# Patient Record
Sex: Female | Born: 1990
Health system: Southern US, Community
[De-identification: ages and names within clinical notes are randomized; demographics above are authoritative.]

## PROBLEM LIST (undated history)

## (undated) DIAGNOSIS — G4730 Sleep apnea, unspecified: Secondary | ICD-10-CM

## (undated) DIAGNOSIS — E119 Type 2 diabetes mellitus without complications: Secondary | ICD-10-CM

## (undated) HISTORY — PX: FRACTURE SURGERY: SHX138

---

## 2011-07-18 ENCOUNTER — Encounter (HOSPITAL_COMMUNITY): Payer: Self-pay | Admitting: Emergency Medicine

## 2011-07-18 ENCOUNTER — Emergency Department (HOSPITAL_COMMUNITY)
Admission: EM | Admit: 2011-07-18 | Discharge: 2011-07-19 | Disposition: A | Discharge status: Home / Self Care | Payer: PRIVATE HEALTH INSURANCE | Attending: Emergency Medicine | Admitting: Emergency Medicine

## 2011-07-18 DIAGNOSIS — L2989 Other pruritus: Secondary | ICD-10-CM | POA: Insufficient documentation

## 2011-07-18 DIAGNOSIS — L298 Other pruritus: Secondary | ICD-10-CM | POA: Insufficient documentation

## 2011-07-18 DIAGNOSIS — R21 Rash and other nonspecific skin eruption: Secondary | ICD-10-CM | POA: Insufficient documentation

## 2011-07-18 DIAGNOSIS — J45909 Unspecified asthma, uncomplicated: Secondary | ICD-10-CM | POA: Insufficient documentation

## 2011-07-18 NOTE — ED Notes (Signed)
Pt c/o rash on bil arms and abd. With itching.  Onset approx 1 week ago.  No known exposure to chicken pox

## 2011-07-19 MED ORDER — FAMOTIDINE 20 MG PO TABS
20.0000 mg | ORAL_TABLET | Freq: Once | ORAL | Status: AC
  Administered 2011-07-19: 20 mg via ORAL
  Filled 2011-07-19: qty 1

## 2011-07-19 MED ORDER — FAMOTIDINE 20 MG PO TABS: 20.0000 mg | ORAL_TABLET | Freq: Two times a day (BID) | ORAL | Status: DC

## 2011-07-19 NOTE — ED Provider Notes (Signed)
Medical screening examination/treatment/procedure(s) were performed by non-physician practitioner and as supervising physician I was immediately available for consultation/collaboration.  Aliyyah Riese K Eulia Hatcher-Rasch, MD 07/19/11 0614 

## 2011-07-19 NOTE — ED Provider Notes (Signed)
History     CSN: [id number]  Arrival date & time 07/18/11  2301   First MD Initiated Contact with Patient 07/19/11 [phone]      Chief Complaint  Patient presents with  . Rash    (Consider location/radiation/quality/duration/timing/severity/associated sxs/prior treatment) HPI Comments: Patient has had URI for the last 2 weeks, low-grade fever intermittently.  Has noticed a rash on her upper arms and abdomen for a week.  Tonight it got itchy, and she's been scratching at.  She is allergic to Benadryl.  Has taken nothing for the itch, which is her most concerning symptom  The history is provided by the patient.    Past Medical History  Diagnosis Date  . Asthma     Past Surgical History  Procedure Date  . Fracture surgery     No family history on file.  History  Substance Use Topics  . Smoking status: Current Everyday Smoker  . Smokeless tobacco: Not on file  . Alcohol Use: No    OB History    Grav Para Term Preterm Abortions TAB SAB Ect Mult Living                  Review of Systems  Constitutional: Negative for fever.  Respiratory: Negative for cough and chest tightness.   Musculoskeletal: Negative for myalgias.  Skin: Positive for rash. Negative for wound.    Allergies  Benadryl  Home Medications   Current Outpatient Rx  Name Route Sig Dispense Refill  . ALBUTEROL SULFATE HFA 108 (90 BASE) MCG/ACT IN AERS Inhalation Inhale 2 puffs into the lungs daily as needed. For shortness of breath    . FAMOTIDINE 20 MG PO TABS Oral Take 1 tablet (20 mg total) by mouth 2 (two) times daily. 30 tablet 0    BP 145/106  Pulse 112  Temp(Src) 98.8 F (37.1 C) (Oral)  Resp 19  SpO2 99%  LMP 06/27/2011  Physical Exam  Constitutional: She is oriented to person, place, and time. She appears well-developed and well-nourished.       Morbidly obese  Eyes: Pupils are equal, round, and reactive to light.  Neck: Normal range of motion.  Cardiovascular: Normal rate.     Pulmonary/Chest: Effort normal.  Abdominal: Soft.  Musculoskeletal: Normal range of motion.  Neurological: She is alert and oriented to person, place, and time.  Skin: Rash noted.  Psychiatric: She has a normal mood and affect.    ED Course  Procedures (including critical care time)  Labs Reviewed - No data to display No results found.   1. Rash and nonspecific skin eruption       MDM  Nonspecific rash on abdomen and upper arms, not indicative of scabies or chickenpox not herpetic in nature        Arman Filter, NP 07/19/11 0149  Arman Filter, NP 07/19/11 0152  Arman Filter, NP 07/19/11 [phone]

## 2013-09-12 ENCOUNTER — Encounter (HOSPITAL_COMMUNITY): Payer: Self-pay | Admitting: Emergency Medicine

## 2013-09-12 DIAGNOSIS — J029 Acute pharyngitis, unspecified: Secondary | ICD-10-CM | POA: Insufficient documentation

## 2013-09-12 DIAGNOSIS — Z791 Long term (current) use of non-steroidal anti-inflammatories (NSAID): Secondary | ICD-10-CM | POA: Insufficient documentation

## 2013-09-12 DIAGNOSIS — R112 Nausea with vomiting, unspecified: Secondary | ICD-10-CM | POA: Insufficient documentation

## 2013-09-12 DIAGNOSIS — R630 Anorexia: Secondary | ICD-10-CM | POA: Insufficient documentation

## 2013-09-12 DIAGNOSIS — Z792 Long term (current) use of antibiotics: Secondary | ICD-10-CM | POA: Insufficient documentation

## 2013-09-12 DIAGNOSIS — Z79899 Other long term (current) drug therapy: Secondary | ICD-10-CM | POA: Insufficient documentation

## 2013-09-12 DIAGNOSIS — J45909 Unspecified asthma, uncomplicated: Secondary | ICD-10-CM | POA: Insufficient documentation

## 2013-09-12 DIAGNOSIS — F17200 Nicotine dependence, unspecified, uncomplicated: Secondary | ICD-10-CM | POA: Insufficient documentation

## 2013-09-12 NOTE — ED Notes (Signed)
Pt. reports worsening sore throat - unable to eat , dizziness with nausea currently taking Amoxicillin antibiotic  for sore throat . Respirations unlabored / airway intact . denies fever or chills.

## 2013-09-13 ENCOUNTER — Emergency Department (HOSPITAL_COMMUNITY)
Admission: EM | Admit: 2013-09-13 | Discharge: 2013-09-13 | Disposition: A | Discharge status: Home / Self Care | Payer: BC Managed Care – PPO | Attending: Emergency Medicine | Admitting: Emergency Medicine

## 2013-09-13 DIAGNOSIS — J029 Acute pharyngitis, unspecified: Secondary | ICD-10-CM

## 2013-09-13 LAB — BASIC METABOLIC PANEL
BUN: 8 mg/dL (ref 6–23)
CALCIUM: 9.3 mg/dL (ref 8.4–10.5)
CO2: 25 meq/L (ref 19–32)
Chloride: 101 mEq/L (ref 96–112)
Creatinine, Ser: 0.64 mg/dL (ref 0.50–1.10)
GFR calc Af Amer: >90 mL/min (ref >90)
Glucose, Bld: 106 mg/dL — ABNORMAL HIGH (ref 70–99)
POTASSIUM: 4.1 meq/L (ref 3.7–5.3)
SODIUM: 140 meq/L (ref 137–147)

## 2013-09-13 LAB — URINALYSIS, ROUTINE W REFLEX MICROSCOPIC
Bilirubin Urine: NEGATIVE
GLUCOSE, UA: NEGATIVE mg/dL
Ketones, ur: 15 mg/dL — AB
LEUKOCYTES UA: NEGATIVE
Nitrite: NEGATIVE
PROTEIN: 30 mg/dL — AB
SPECIFIC GRAVITY, URINE: 1.033 — AB (ref 1.005–1.030)
UROBILINOGEN UA: 0.2 mg/dL (ref 0.0–1.0)
pH: 6.5 (ref 5.0–8.0)

## 2013-09-13 LAB — CBC WITH DIFFERENTIAL/PLATELET
BASOS ABS: 0 10*3/uL (ref 0.0–0.1)
Basophils Relative: 0 % (ref 0–1)
EOS PCT: 1 % (ref 0–5)
Eosinophils Absolute: 0.1 10*3/uL (ref 0.0–0.7)
HCT: 34.2 % — ABNORMAL LOW (ref 36.0–46.0)
Hemoglobin: 10.6 g/dL — ABNORMAL LOW (ref 12.0–15.0)
LYMPHS ABS: 3.6 10*3/uL (ref 0.7–4.0)
Lymphocytes Relative: 40 % (ref 12–46)
MCH: 20.5 pg — ABNORMAL LOW (ref 26.0–34.0)
MCHC: 31 g/dL (ref 30.0–36.0)
MCV: 66.2 fL — AB (ref 78.0–100.0)
MONOS PCT: 5 % (ref 3–12)
Monocytes Absolute: 0.5 10*3/uL (ref 0.1–1.0)
NEUTROS PCT: 54 % (ref 43–77)
Neutro Abs: 4.9 10*3/uL (ref 1.7–7.7)
PLATELETS: 463 10*3/uL — AB (ref 150–400)
RBC: 5.17 MIL/uL — AB (ref 3.87–5.11)
RDW: 17.9 % — AB (ref 11.5–15.5)
WBC: 9.1 10*3/uL (ref 4.0–10.5)

## 2013-09-13 LAB — URINE MICROSCOPIC-ADD ON

## 2013-09-13 MED ORDER — DEXAMETHASONE SODIUM PHOSPHATE 10 MG/ML IJ SOLN
10.0000 mg | Freq: Once | INTRAMUSCULAR | Status: AC
  Administered 2013-09-13: 10 mg via INTRAMUSCULAR
  Filled 2013-09-13: qty 1

## 2013-09-13 MED ORDER — SODIUM CHLORIDE 0.9 % IV BOLUS (SEPSIS)
500.0000 mL | Freq: Once | INTRAVENOUS | Status: AC
  Administered 2013-09-13: 500 mL via INTRAVENOUS

## 2013-09-13 MED ORDER — LIDOCAINE VISCOUS 2 % MT SOLN: 20.0000 mL | OROMUCOSAL | Status: DC | PRN

## 2013-09-13 MED ORDER — ONDANSETRON 4 MG PO TBDP: 4.0000 mg | ORAL_TABLET | Freq: Three times a day (TID) | ORAL | Status: DC | PRN

## 2013-09-13 NOTE — Discharge Instructions (Signed)
Pharyngitis °Pharyngitis is redness, pain, and swelling (inflammation) of your pharynx.  °CAUSES  °Pharyngitis is usually caused by infection. Most of the time, these infections are from viruses (viral) and are part of a cold. However, sometimes pharyngitis is caused by bacteria (bacterial). Pharyngitis can also be caused by allergies. Viral pharyngitis may be spread from person to person by coughing, sneezing, and personal items or utensils (cups, forks, spoons, toothbrushes). Bacterial pharyngitis may be spread from person to person by more intimate contact, such as kissing.  °SIGNS AND SYMPTOMS  °Symptoms of pharyngitis include:   °· Sore throat.   °· Tiredness (fatigue).   °· Low-grade fever.   °· Headache. °· Joint pain and muscle aches. °· Skin rashes. °· Swollen lymph nodes. °· Plaque-like film on throat or tonsils (often seen with bacterial pharyngitis). °DIAGNOSIS  °Your health care provider will ask you questions about your illness and your symptoms. Your medical history, along with a physical exam, is often all that is needed to diagnose pharyngitis. Sometimes, a rapid strep test is done. Other lab tests may also be done, depending on the suspected cause.  °TREATMENT  °Viral pharyngitis will usually get better in 3 4 days without the use of medicine. Bacterial pharyngitis is treated with medicines that kill germs (antibiotics).  °HOME CARE INSTRUCTIONS  °· Drink enough water and fluids to keep your urine clear or pale yellow.   °· Only take over-the-counter or prescription medicines as directed by your health care provider:   °· If you are prescribed antibiotics, make sure you finish them even if you start to feel better.   °· Do not take aspirin.   °· Get lots of rest.   °· Gargle with 8 oz of salt water (½ tsp of salt per 1 qt of water) as often as every 1 2 hours to soothe your throat.   °· Throat lozenges (if you are not at risk for choking) or sprays may be used to soothe your throat. °SEEK MEDICAL  CARE IF:  °· You have large, tender lumps in your neck. °· You have a rash. °· You cough up green, yellow-brown, or bloody spit. °SEEK IMMEDIATE MEDICAL CARE IF:  °· Your neck becomes stiff. °· You drool or are unable to swallow liquids. °· You vomit or are unable to keep medicines or liquids down. °· You have severe pain that does not go away with the use of recommended medicines. °· You have trouble breathing (not caused by a stuffy nose). °MAKE SURE YOU:  °· Understand these instructions. °· Will watch your condition. °· Will get help right away if you are not doing well or get worse. °Document Released: 05/26/2005 Document Revised: 03/16/2013 Document Reviewed: 01/31/2013 °[employer]® Patient Information ©2014 [employer]. ° °

## 2013-09-13 NOTE — ED Provider Notes (Signed)
CSN: [id number]     Arrival date & time 09/12/13  2143 History   First MD Initiated Contact with Patient 09/13/13 0121     Chief Complaint  Patient presents with  . Sore Throat     (Consider location/radiation/quality/duration/timing/severity/associated sxs/prior Treatment) Patient is a 23 y.o. female presenting with pharyngitis. The history is provided by the patient. No language interpreter was used.  Sore Throat The current episode started in the past 7 days. The problem has been gradually worsening. Associated symptoms include anorexia, chills, congestion, headaches, nausea, a sore throat, vomiting and weakness. The symptoms are aggravated by swallowing.    Past Medical History  Diagnosis Date  . Asthma    Past Surgical History  Procedure Laterality Date  . Fracture surgery     No family history on file. History  Substance Use Topics  . Smoking status: Current Every Day Smoker  . Smokeless tobacco: Not on file  . Alcohol Use: No   OB History   Grav Para Term Preterm Abortions TAB SAB Ect Mult Living                 Review of Systems  Constitutional: Positive for chills.  HENT: Positive for congestion and sore throat.   Gastrointestinal: Positive for nausea, vomiting and anorexia.  Neurological: Positive for weakness and headaches.  All other systems reviewed and are negative.      Allergies  Benadryl  Home Medications   Current Outpatient Rx  Name  Route  Sig  Dispense  Refill  . albuterol (PROVENTIL HFA;VENTOLIN HFA) 108 (90 BASE) MCG/ACT inhaler   Inhalation   Inhale 2 puffs into the lungs daily as needed. For shortness of breath         . amoxicillin (AMOXIL) 500 MG capsule   Oral   Take 500 mg by mouth 3 (three) times daily. For 10 days. Started on 09-04-13         . HYDROcodone-acetaminophen (NORCO/VICODIN) 5-325 MG per tablet   Oral   Take 1 tablet by mouth every 6 (six) hours as needed for moderate pain.         . naproxen sodium  (ANAPROX) 220 MG tablet   Oral   Take 220 mg by mouth 2 (two) times daily with a meal.         . Phenylephrine-DM-GG-APAP (COLD & FLU SEVERE) 5-10-200-325 MG TABS   Oral   Take 1 tablet by mouth every 6 (six) hours as needed (c).         . EXPIRED: famotidine (PEPCID) 20 MG tablet   Oral   Take 1 tablet (20 mg total) by mouth 2 (two) times daily.   30 tablet   0    BP 147/94  Pulse 77  Temp(Src) 98.3 F (36.8 C) (Oral)  Resp 14  Ht 5\' 5"  (1.651 m)  Wt 314 lb (142.429 kg)  BMI 52.25 kg/m2  SpO2 99%  LMP 09/09/2013 Physical Exam  Nursing note and vitals reviewed. Constitutional: She is oriented to person, place, and time. She appears well-developed and well-nourished.  HENT:  Head: Normocephalic.  Mouth/Throat: No oropharyngeal exudate.  Eyes: Pupils are equal, round, and reactive to light.  Neck: Normal range of motion.  Cardiovascular: Normal rate and regular rhythm.   Pulmonary/Chest: Effort normal and breath sounds normal.  Abdominal: Soft.  Musculoskeletal: She exhibits no edema and no tenderness.  Lymphadenopathy:    She has no cervical adenopathy.  Neurological: She is alert and oriented to  person, place, and time.  Skin: Skin is warm and dry.  Psychiatric: She has a normal mood and affect.    ED Course  Procedures (including critical care time) Labs Review Labs Reviewed - No data to display Imaging Review No results found.   EKG Interpretation None     Labs reviewed, results shared with patient.  Feels better after IV fluids.  Tolerating PO fluid. MDM   Final diagnoses:  None    Pharyngitis.    Jimmye Normanavid John Worthy Boschert, NP 09/13/13 [phone]

## 2013-09-13 NOTE — ED Provider Notes (Signed)
Medical screening examination/treatment/procedure(s) were performed by non-physician practitioner and as supervising physician I was immediately available for consultation/collaboration.   EKG Interpretation None        Brandt LoosenJulie Manly, MD 09/13/13 [phone]

## 2013-10-01 ENCOUNTER — Emergency Department (HOSPITAL_COMMUNITY): Payer: BC Managed Care – PPO

## 2013-10-01 ENCOUNTER — Emergency Department (HOSPITAL_COMMUNITY)
Admission: EM | Admit: 2013-10-01 | Discharge: 2013-10-01 | Disposition: A | Discharge status: Home / Self Care | Payer: BC Managed Care – PPO | Attending: Emergency Medicine | Admitting: Emergency Medicine

## 2013-10-01 ENCOUNTER — Encounter (HOSPITAL_COMMUNITY): Payer: Self-pay | Admitting: Emergency Medicine

## 2013-10-01 DIAGNOSIS — F17200 Nicotine dependence, unspecified, uncomplicated: Secondary | ICD-10-CM | POA: Insufficient documentation

## 2013-10-01 DIAGNOSIS — J45909 Unspecified asthma, uncomplicated: Secondary | ICD-10-CM | POA: Insufficient documentation

## 2013-10-01 DIAGNOSIS — X500XXA Overexertion from strenuous movement or load, initial encounter: Secondary | ICD-10-CM | POA: Insufficient documentation

## 2013-10-01 DIAGNOSIS — Z79899 Other long term (current) drug therapy: Secondary | ICD-10-CM | POA: Insufficient documentation

## 2013-10-01 DIAGNOSIS — Z791 Long term (current) use of non-steroidal anti-inflammatories (NSAID): Secondary | ICD-10-CM | POA: Insufficient documentation

## 2013-10-01 DIAGNOSIS — S93409A Sprain of unspecified ligament of unspecified ankle, initial encounter: Secondary | ICD-10-CM | POA: Insufficient documentation

## 2013-10-01 DIAGNOSIS — S93401A Sprain of unspecified ligament of right ankle, initial encounter: Secondary | ICD-10-CM

## 2013-10-01 DIAGNOSIS — Y9301 Activity, walking, marching and hiking: Secondary | ICD-10-CM | POA: Insufficient documentation

## 2013-10-01 DIAGNOSIS — Y929 Unspecified place or not applicable: Secondary | ICD-10-CM | POA: Insufficient documentation

## 2013-10-01 MED ORDER — IBUPROFEN 800 MG PO TABS: 800.0000 mg | ORAL_TABLET | Freq: Three times a day (TID) | ORAL | Status: DC

## 2013-10-01 MED ORDER — HYDROCODONE-ACETAMINOPHEN 5-325 MG PO TABS: 1.0000 | ORAL_TABLET | Freq: Four times a day (QID) | ORAL | Status: DC | PRN

## 2013-10-01 MED ORDER — METHOCARBAMOL 500 MG PO TABS: 500.0000 mg | ORAL_TABLET | Freq: Two times a day (BID) | ORAL | Status: DC

## 2013-10-01 NOTE — Progress Notes (Signed)
Orthopedic Tech Progress Note Patient Details:  Diana Joyce 11/10/1990 [id number]  Ortho Devices Type of Ortho Device: ASO Ortho Device/Splint Location: rle Ortho Device/Splint Interventions: Application   Deloris Mittag 10/01/2013, 8:16 PM

## 2013-10-01 NOTE — Discharge Instructions (Signed)
Acute Ankle Sprain  with Phase I Rehab  An acute ankle sprain is a partial or complete tear in one or more of the ligaments of the ankle due to traumatic injury. The severity of the injury depends on both the the number of ligaments sprained and the grade of sprain. There are 3 grades of sprains.   · A grade 1 sprain is a mild sprain. There is a slight pull without obvious tearing. There is no loss of strength, and the muscle and ligament are the correct length.  · A grade 2 sprain is a moderate sprain. There is tearing of fibers within the substance of the ligament where it connects two bones or two cartilages. The length of the ligament is increased, and there is usually decreased strength.  · A grade 3 sprain is a complete rupture of the ligament and is uncommon.  In addition to the grade of sprain, there are three types of ankle sprains.   Lateral ankle sprains: This is a sprain of one or more of the three ligaments on the outer side (lateral) of the ankle. These are the most common sprains.  Medial ankle sprains: There is one large triangular ligament of the inner side (medial) of the ankle that is susceptible to injury. Medial ankle sprains are less common.  Syndesmosis, "high ankle," sprains: The syndesmosis is the ligament that connects the two bones of the lower leg. Syndesmosis sprains usually only occur with very severe ankle sprains.  SYMPTOMS  · Pain, tenderness, and swelling in the ankle, starting at the side of injury that may progress to the whole ankle and foot with time.  · "Pop" or tearing sensation at the time of injury.  · Bruising that may spread to the heel.  · Impaired ability to walk soon after injury.  CAUSES   · Acute ankle sprains are caused by trauma placed on the ankle that temporarily forces or pries the anklebone (talus) out of its normal socket.  · Stretching or tearing of the ligaments that normally hold the joint in place (usually due to a twisting injury).  RISK INCREASES  WITH:  · Previous ankle sprain.  · Sports in which the foot may land awkwardly (ie. basketball, volleyball, or soccer) or walking or running on uneven or rough surfaces.  · Shoes with inadequate support to prevent sideways motion when stress occurs.  · Poor strength and flexibility.  · Poor balance skills.  · Contact sports.  PREVENTION   · Warm up and stretch properly before activity.  · Maintain physical fitness:  · Ankle and leg flexibility, muscle strength, and endurance.  · Cardiovascular fitness.  · Balance training activities.  · Use proper technique and have a coach correct improper technique.  · Taping, protective strapping, bracing, or high-top tennis shoes may help prevent injury. Initially, tape is best; however, it loses most of its support function within 10 to 15 minutes.  · Wear proper fitted protective shoes (High-top shoes with taping or bracing is more effective than either alone).  · Provide the ankle with support during sports and practice activities for 12 months following injury.  PROGNOSIS   · If treated properly, ankle sprains can be expected to recover completely; however, the length of recovery depends on the degree of injury.  · A grade 1 sprain usually heals enough in 5 to 7 days to allow modified activity and requires an average of 6 weeks to heal completely.  · A grade 2 sprain requires   6 to 10 weeks to heal completely.  · A grade 3 sprain requires 12 to 16 weeks to heal.  · A syndesmosis sprain often takes more than 3 months to heal.  RELATED COMPLICATIONS   · Frequent recurrence of symptoms may result in a chronic problem. Appropriately addressing the problem the first time decreases the frequency of recurrence and optimizes healing time. Severity of the initial sprain does not predict the likelihood of later instability.  · Injury to other structures (bone, cartilage, or tendon).  · A chronically unstable or arthritic ankle joint is a possiblity with repeated  sprains.  TREATMENT  Treatment initially involves the use of ice, medication, and compression bandages to help reduce pain and inflammation. Ankle sprains are usually immobilized in a walking cast or boot to allow for healing. Crutches may be recommended to reduce pressure on the injury. After immobilization, strengthening and stretching exercises may be necessary to regain strength and a full range of motion. Surgery is rarely needed to treat ankle sprains.  MEDICATION   · Nonsteroidal anti-inflammatory medications, such as aspirin and ibuprofen (do not take for the first 3 days after injury or within 7 days before surgery), or other minor pain relievers, such as acetaminophen, are often recommended. Take these as directed by your caregiver. Contact your caregiver immediately if any bleeding, stomach upset, or signs of an allergic reaction occur from these medications.  · Ointments applied to the skin may be helpful.  · Pain relievers may be prescribed as necessary by your caregiver. Do not take prescription pain medication for longer than 4 to 7 days. Use only as directed and only as much as you need.  HEAT AND COLD  · Cold treatment (icing) is used to relieve pain and reduce inflammation for acute and chronic cases. Cold should be applied for 10 to 15 minutes every 2 to 3 hours for inflammation and pain and immediately after any activity that aggravates your symptoms. Use ice packs or an ice massage.  · Heat treatment may be used before performing stretching and strengthening activities prescribed by your caregiver. Use a heat pack or a warm soak.  SEEK IMMEDIATE MEDICAL CARE IF:   · Pain, swelling, or bruising worsens despite treatment.  · You experience pain, numbness, discoloration, or coldness in the foot or toes.  · New, unexplained symptoms develop (drugs used in treatment may produce side effects.)  EXERCISES   PHASE I EXERCISES  RANGE OF MOTION (ROM) AND STRETCHING EXERCISES - Ankle Sprain, Acute Phase I,  Weeks 1 to 2  These exercises may help you when beginning to restore flexibility in your ankle. You will likely work on these exercises for the 1 to 2 weeks after your injury. Once your physician, physical therapist, or athletic trainer sees adequate progress, he or she will advance your exercises. While completing these exercises, remember:   · Restoring tissue flexibility helps normal motion to return to the joints. This allows healthier, less painful movement and activity.  · An effective stretch should be held for at least 30 seconds.  · A stretch should never be painful. You should only feel a gentle lengthening or release in the stretched tissue.  RANGE OF MOTION - Dorsi/Plantar Flexion  · While sitting with your right / left knee straight, draw the top of your foot upwards by flexing your ankle. Then reverse the motion, pointing your toes downward.  · Hold each position for __________ seconds.  · After completing your first set of   exercises, repeat this exercise with your knee bent.  Repeat __________ times. Complete this exercise __________ times per day.   RANGE OF MOTION - Ankle Alphabet  · Imagine your right / left big toe is a pen.  · Keeping your hip and knee still, write out the entire alphabet with your "pen." Make the letters as large as you can without increasing any discomfort.  Repeat __________ times. Complete this exercise __________ times per day.   STRENGTHENING EXERCISES - Ankle Sprain, Acute -Phase I, Weeks 1 to 2  These exercises may help you when beginning to restore strength in your ankle. You will likely work on these exercises for 1 to 2 weeks after your injury. Once your physician, physical therapist, or athletic trainer sees adequate progress, he or she will advance your exercises. While completing these exercises, remember:   · Muscles can gain both the endurance and the strength needed for everyday activities through controlled exercises.  · Complete these exercises as instructed by  your physician, physical therapist, or athletic trainer. Progress the resistance and repetitions only as guided.  · You may experience muscle soreness or fatigue, but the pain or discomfort you are trying to eliminate should never worsen during these exercises. If this pain does worsen, stop and make certain you are following the directions exactly. If the pain is still present after adjustments, discontinue the exercise until you can discuss the trouble with your clinician.  STRENGTH - Dorsiflexors  · Secure a rubber exercise band/tubing to a fixed object (ie. table, pole) and loop the other end around your right / left foot.  · Sit on the floor facing the fixed object. The band/tubing should be slightly tense when your foot is relaxed.  · Slowly draw your foot back toward you using your ankle and toes.  · Hold this position for __________ seconds. Slowly release the tension in the band and return your foot to the starting position.  Repeat __________ times. Complete this exercise __________ times per day.   STRENGTH - Plantar-flexors   · Sit with your right / left leg extended. Holding onto both ends of a rubber exercise band/tubing, loop it around the ball of your foot. Keep a slight tension in the band.  · Slowly push your toes away from you, pointing them downward.  · Hold this position for __________ seconds. Return slowly, controlling the tension in the band/tubing.  Repeat __________ times. Complete this exercise __________ times per day.   STRENGTH - Ankle Eversion  · Secure one end of a rubber exercise band/tubing to a fixed object (table, pole). Loop the other end around your foot just before your toes.  · Place your fists between your knees. This will focus your strengthening at your ankle.  · Drawing the band/tubing across your opposite foot, slowly, pull your little toe out and up. Make sure the band/tubing is positioned to resist the entire motion.  · Hold this position for __________ seconds.  Have  your muscles resist the band/tubing as it slowly pulls your foot back to the starting position.   Repeat __________ times. Complete this exercise __________ times per day.   STRENGTH - Ankle Inversion  · Secure one end of a rubber exercise band/tubing to a fixed object (table, pole). Loop the other end around your foot just before your toes.  · Place your fists between your knees. This will focus your strengthening at your ankle.  · Slowly, pull your big toe up and in, making   sure the band/tubing is positioned to resist the entire motion.  · Hold this position for __________ seconds.  · Have your muscles resist the band/tubing as it slowly pulls your foot back to the starting position.  Repeat __________ times. Complete this exercises __________ times per day.   STRENGTH - Towel Curls  · Sit in a chair positioned on a non-carpeted surface.  · Place your right / left foot on a towel, keeping your heel on the floor.  · Pull the towel toward your heel by only curling your toes. Keep your heel on the floor.  · If instructed by your physician, physical therapist, or athletic trainer, add weight to the end of the towel.  Repeat __________ times. Complete this exercise __________ times per day.  Document Released: 12/25/2004 Document Revised: 08/18/2011 Document Reviewed: 09/07/2008  [employer]® Patient Information ©2014 [employer].

## 2013-10-01 NOTE — ED Notes (Signed)
Ortho notified

## 2013-10-01 NOTE — ED Provider Notes (Signed)
CSN: [id number]     Arrival date & time 10/01/13  1738 History   First MD Initiated Contact with Patient 10/01/13 1754 This chart was scribed for non-physician practitioner Fayrene HelperBowie Garyn Arlotta, PA-C working with Gavin PoundMichael Y. Oletta LamasGhim, MD by Valera CastleSteven Perry, ED scribe. This patient was seen in room TR11C/TR11C and the patient's care was started at 6:08 PM.     Chief Complaint  Patient presents with  . Ankle Pain   (Consider location/radiation/quality/duration/timing/severity/associated sxs/prior Treatment) The history is provided by the patient. No language interpreter was used.   HPI Comments: Diana Joyce is a 23 y.o. female who presents to the Emergency Department complaining of constant, right ankle pain, with associated swelling, onset yesterday when she rolled her ankle after stepping in a crack while walking. She reports her ankle pain radiates up her right shin and reports sharp 10/10 pain upon palpation of her ankle. She reports being ambulatory after the incident, but has pain when applying pressure. She denies h/o right ankle injury. She reports taking Vicodin for her pain without relief. She denies hitting her head, LOC, wounds, and any other associated symptoms.   PCP - Default, Provider, MD  Past Medical History  Diagnosis Date  . Asthma   . Borderline diabetes    Past Surgical History  Procedure Laterality Date  . Fracture surgery     No family history on file. History  Substance Use Topics  . Smoking status: Current Some Day Smoker  . Smokeless tobacco: Not on file  . Alcohol Use: No   OB History   Grav Para Term Preterm Abortions TAB SAB Ect Mult Living                 Review of Systems  Musculoskeletal: Positive for arthralgias (right ankle) and joint swelling.  Skin: Negative for wound.   Allergies  Benadryl  Home Medications   Prior to Admission medications   Medication Sig Start Date End Date Taking? Authorizing Provider  albuterol (PROVENTIL HFA;VENTOLIN HFA) 108  (90 BASE) MCG/ACT inhaler Inhale 2 puffs into the lungs daily as needed. For shortness of breath    Historical Provider, MD  amoxicillin (AMOXIL) 500 MG capsule Take 500 mg by mouth 3 (three) times daily. For 10 days. Started on 09-04-13    Historical Provider, MD  famotidine (PEPCID) 20 MG tablet Take 1 tablet (20 mg total) by mouth 2 (two) times daily. 07/19/11 07/18/12  Arman FilterGail K Schulz, NP  HYDROcodone-acetaminophen (NORCO/VICODIN) 5-325 MG per tablet Take 1 tablet by mouth every 6 (six) hours as needed for moderate pain.    Historical Provider, MD  lidocaine (XYLOCAINE) 2 % solution Use as directed 20 mLs in the mouth or throat as needed for mouth pain. 09/13/13   Jimmye Normanavid John Smith, NP  naproxen sodium (ANAPROX) 220 MG tablet Take 220 mg by mouth 2 (two) times daily with a meal.    Historical Provider, MD  ondansetron (ZOFRAN-ODT) 4 MG disintegrating tablet Take 1 tablet (4 mg total) by mouth every 8 (eight) hours as needed for nausea. 09/13/13   Jimmye Normanavid John Smith, NP  Phenylephrine-DM-GG-APAP (COLD & FLU SEVERE) 5-10-200-325 MG TABS Take 1 tablet by mouth every 6 (six) hours as needed (c).    Historical Provider, MD   BP 143/68  Pulse 97  Temp(Src) 98.3 F (36.8 C) (Oral)  Ht 5\' 5"  (1.651 m)  Wt 315 lb (142.883 kg)  BMI 52.42 kg/m2  SpO2 100%  LMP 09/09/2013  Physical Exam  Nursing note and  vitals reviewed. Constitutional: She is oriented to person, place, and time. She appears well-developed and well-nourished. No distress.  HENT:  Head: Normocephalic and atraumatic.  Eyes: EOM are normal.  Neck: Neck supple.  Cardiovascular: Normal rate and intact distal pulses.   DP pulses intact.  Pulmonary/Chest: Effort normal. No respiratory distress.  Musculoskeletal: Normal range of motion.  Right ankle tenderness to medial malleolus, lateral malleolus, and posterior mallelous with edema noted to lateral malleolus. Edema noted to proximal dorsum of right foot. Tenderness to 5th MTP of right foot.  Decreased ROM to right ankle in all directions secondary to pain.  Neurological: She is alert and oriented to person, place, and time.  Normal sensation throughout. Brisk cap refill to all toes.  Skin: Skin is warm and dry.  Psychiatric: She has a normal mood and affect. Her behavior is normal.    ED Course  Procedures (including critical care time)  DIAGNOSTIC STUDIES: Oxygen Saturation is 100% on room air, normal by my interpretation.    COORDINATION OF CARE: 6:14 PM-Discussed treatment plan which includes DG right ankle and foot with pt at bedside and pt agreed to plan.   7:47 PM Xray of R ankle/foot without acute fx/dislocation.  RICE therapy discussed.  ASO and crutches provided.  Ortho referral as needed.    Dg Ankle Complete Right  10/01/2013   CLINICAL DATA:  Twisted ankle on uneven pavement, lateral pain extending to top of foot  EXAM: RIGHT ANKLE - COMPLETE 3+ VIEW  COMPARISON:  None.  FINDINGS: Lateral soft tissue swelling.  Osseous mineralization normal.  Joint spaces preserved.  No acute fracture, dislocation or bone destruction.  IMPRESSION: No acute osseous abnormalities.   Electronically Signed   By: Ulyses SouthwardMark  Boles M.D.   On: 10/01/2013 19:10   Dg Foot Complete Right  10/01/2013   CLINICAL DATA:  Twisted ankle on uneven pavement, lateral ankle pain extending to top of foot especially base of third metatarsal  EXAM: RIGHT FOOT COMPLETE - 3+ VIEW  COMPARISON:  None  FINDINGS: Osseous mineralization normal.  Joint spaces preserved.  No fracture, dislocation, or bone destruction.  IMPRESSION: No acute osseous abnormalities.   Electronically Signed   By: Ulyses SouthwardMark  Boles M.D.   On: 10/01/2013 19:11    EKG Interpretation None     Medications - No data to display  MDM   Final diagnoses:  Right ankle sprain    BP 143/68  Pulse 97  Temp(Src) 98.3 F (36.8 C) (Oral)  Ht 5\' 5"  (1.651 m)  Wt 315 lb (142.883 kg)  BMI 52.42 kg/m2  SpO2 100%  LMP 09/09/2013  I have reviewed  nursing notes and vital signs. I personally reviewed the imaging tests through PACS system  I reviewed available ER/hospitalization records thought the EMR   I personally performed the services described in this documentation, which was scribed in my presence. The recorded information has been reviewed and is accurate.     Fayrene HelperBowie Geisha Abernathy, PA-C 10/01/13 1948

## 2013-10-01 NOTE — ED Notes (Signed)
Per EMS patient rolled right ankle last night. Swelling to foot and ankle. Splinted by PTAR with pillow.

## 2013-10-02 NOTE — ED Provider Notes (Signed)
Medical screening examination/treatment/procedure(s) were performed by non-physician practitioner and as supervising physician I was immediately available for consultation/collaboration.  Ayeisha Lindenberger Y. Tenee Wish, MD 10/02/13 0036 

## 2014-02-17 ENCOUNTER — Emergency Department (INDEPENDENT_AMBULATORY_CARE_PROVIDER_SITE_OTHER)
Admission: EM | Admit: 2014-02-17 | Discharge: 2014-02-17 | Disposition: A | Discharge status: Home / Self Care | Payer: BC Managed Care – PPO | Source: Home / Self Care

## 2014-02-17 ENCOUNTER — Encounter (HOSPITAL_COMMUNITY): Payer: Self-pay | Admitting: Emergency Medicine

## 2014-02-17 DIAGNOSIS — Z8639 Personal history of other endocrine, nutritional and metabolic disease: Secondary | ICD-10-CM

## 2014-02-17 DIAGNOSIS — R739 Hyperglycemia, unspecified: Secondary | ICD-10-CM

## 2014-02-17 DIAGNOSIS — Z862 Personal history of diseases of the blood and blood-forming organs and certain disorders involving the immune mechanism: Secondary | ICD-10-CM

## 2014-02-17 DIAGNOSIS — R7309 Other abnormal glucose: Secondary | ICD-10-CM

## 2014-02-17 LAB — POCT URINALYSIS DIP (DEVICE)
Bilirubin Urine: NEGATIVE
Glucose, UA: NEGATIVE mg/dL
HGB URINE DIPSTICK: NEGATIVE
Ketones, ur: NEGATIVE mg/dL
Leukocytes, UA: NEGATIVE
NITRITE: NEGATIVE
PH: 6 (ref 5.0–8.0)
Protein, ur: 30 mg/dL — AB
Specific Gravity, Urine: 1.025 (ref 1.005–1.030)
UROBILINOGEN UA: 0.2 mg/dL (ref 0.0–1.0)

## 2014-02-17 LAB — POCT I-STAT, CHEM 8
BUN: 10 mg/dL (ref 6–23)
CALCIUM ION: 1.16 mmol/L (ref 1.12–1.23)
Chloride: 102 mEq/L (ref 96–112)
Creatinine, Ser: 0.6 mg/dL (ref 0.50–1.10)
GLUCOSE: 177 mg/dL — AB (ref 70–99)
HEMATOCRIT: 39 % (ref 36.0–46.0)
HEMOGLOBIN: 13.3 g/dL (ref 12.0–15.0)
Potassium: 3.8 mEq/L (ref 3.7–5.3)
Sodium: 137 mEq/L (ref 137–147)
TCO2: 26 mmol/L (ref 0–100)

## 2014-02-17 MED ORDER — METFORMIN HCL 500 MG PO TABS: ORAL_TABLET | ORAL | Status: DC

## 2014-02-17 NOTE — Discharge Instructions (Signed)
Blood Glucose Monitoring °Monitoring your blood glucose (also know as blood sugar) helps you to manage your diabetes. It also helps you and your health care provider monitor your diabetes and determine how well your treatment plan is working. °WHY SHOULD YOU MONITOR YOUR BLOOD GLUCOSE? °· It can help you understand how food, exercise, and medicine affect your blood glucose. °· It allows you to know what your blood glucose is at any given moment. You can quickly tell if you are having low blood glucose (hypoglycemia) or high blood glucose (hyperglycemia). °· It can help you and your health care provider know how to adjust your medicines. °· It can help you understand how to manage an illness or adjust medicine for exercise. °WHEN SHOULD YOU TEST? °Your health care provider will help you decide how often you should check your blood glucose. This may depend on the type of diabetes you have, your diabetes control, or the types of medicines you are taking. Be sure to write down all of your blood glucose readings so that this information can be reviewed with your health care provider. See below for examples of testing times that your health care provider may suggest. °Type 1 Diabetes °· Test 4 times a day if you are in good control, using an insulin pump, or perform multiple daily injections. °· If your diabetes is not well controlled or if you are sick, you may need to monitor more often. °· It is a good idea to also monitor: °¨ Before and after exercise. °¨ Between meals and 2 hours after a meal. °¨ Occasionally between 2:00 a.m. and 3:00 a.m. °Type 2 Diabetes °· It can vary with each person, but generally, if you are on insulin, test 4 times a day. °· If you take medicines by mouth (orally), test 2 times a day. °· If you are on a controlled diet, test once a day. °· If your diabetes is not well controlled or if you are sick, you may need to monitor more often. °HOW TO MONITOR YOUR BLOOD GLUCOSE °Supplies  Needed °· Blood glucose meter. °· Test strips for your meter. Each meter has its own strips. You must use the strips that go with your own meter. °· A pricking needle (lancet). °· A device that holds the lancet (lancing device). °· A journal or log book to write down your results. °Procedure °· Wash your hands with soap and water. Alcohol is not preferred. °· Prick the side of your finger (not the tip) with the lancet. °· Gently milk the finger until a small drop of blood appears. °· Follow the instructions that come with your meter for inserting the test strip, applying blood to the strip, and using your blood glucose meter. °Other Areas to Get Blood for Testing °Some meters allow you to use other areas of your body (other than your finger) to test your blood. These areas are called alternative sites. The most common alternative sites are: °· The forearm. °· The thigh. °· The back area of the lower leg. °· The palm of the hand. °The blood flow in these areas is slower. Therefore, the blood glucose values you get may be delayed, and the numbers are different from what you would get from your fingers. Do not use alternative sites if you think you are having hypoglycemia. Your reading will not be accurate. Always use a finger if you are having hypoglycemia. Also, if you cannot feel your lows (hypoglycemia unawareness), always use your fingers for your   blood glucose checks. ADDITIONAL TIPS FOR GLUCOSE MONITORING  Do not reuse lancets.  Always carry your supplies with you.  All blood glucose meters have a 24-hour "hotline" number to call if you have questions or need help.  Adjust (calibrate) your blood glucose meter with a control solution after finishing a few boxes of strips. BLOOD GLUCOSE RECORD KEEPING It is a good idea to keep a daily record or log of your blood glucose readings. Most glucose meters, if not all, keep your glucose records stored in the meter. Some meters come with the ability to download  your records to your home computer. Keeping a record of your blood glucose readings is especially helpful if you are wanting to look for patterns. Make notes to go along with the blood glucose readings because you might forget what happened at that exact time. Keeping good records helps you and your health care provider to work together to achieve good diabetes management.  Document Released: 05/29/2003 Document Revised: 10/10/2013 Document Reviewed: 10/18/2012 [facility] Patient Information 2015 [place], [place]. This information is not intended to replace advice given to you by your health care provider. Make sure you discuss any questions you have with your health care provider.  High Blood Sugar High blood sugar (hyperglycemia) means that the level of sugar in your blood is higher than it should be. Signs of high blood sugar include:  Feeling thirsty.  Frequent peeing (urinating).  Feeling tired or sleepy.  Dry mouth.  Vision changes.  Feeling weak.  Feeling hungry but losing weight.  Numbness and tingling in your hands or feet.  Headache. When you ignore these signs, your blood sugar may keep going up. These problems may get worse, and other problems may begin. HOME CARE  Check your blood sugars as told by your doctor. Write down the numbers with the date and time.  Take the right amount of insulin or diabetes pills at the right time. Write down the dose with date and time.  Refill your insulin or diabetes pills before running out.  Watch what you eat. Follow your meal plan.  Drink liquids without sugar, such as water. Check with your doctor if you have kidney or heart disease.  Follow your doctor's orders for exercise. Exercise at the same time of day.  Keep your doctor's appointments. GET HELP RIGHT AWAY IF:   You have trouble thinking or are confused.  You have fast breathing with fruity smelling breath.  You pass out (faint).  You have 2 to 3 days of high blood  sugars and you do not know why.  You have chest pain.  You are feeling sick to your stomach (nauseous) or throwing up (vomiting).  You have sudden vision changes. MAKE SURE YOU:   Understand these instructions.  Will watch your condition.  Will get help right away if you are not doing well or get worse. Document Released: 03/23/2009 Document Revised: 08/18/2011 Document Reviewed: 03/23/2009 [facility] Patient Information 2015 [place], [place]. This information is not intended to replace advice given to you by your health care provider. Make sure you discuss any questions you have with your health care provider.  Obesity Obesity is having too much body fat and a body mass index (BMI) of 30 or more. BMI is a number based on your height and weight. The number is an estimate of how much body fat you have. Obesity can happen if you eat more calories than you can burn by exercising or other activity. It can  cause major health problems or emergencies.  HOME CARE  Exercise and be active as told by your doctor. Try:  Using stairs when you can.  Parking farther away from store doors.  Gardening, biking, or walking.  Eat healthy foods and drinks that are low in calories. Eat more fruits and vegetables.  Limit fast food, sweets, and snack foods that are made with ingredients that are not natural (processed food).  Eat smaller amounts of food.  Keep a journal and write down what you eat every day. Websites can help with this.  Avoid drinking alcohol. Drink more water and drinks without calories.   Take vitamins and dietary pills (supplements) only as told by your doctor.  Try going to weight-loss support groups or classes to help lessen stress. Dietitians and counselors may also help. GET HELP RIGHT AWAY IF:  You have chest pain or tightness.  You have trouble breathing or feel short of breath.  You feel weak or have loss of feeling (numbness) in your legs.  You feel confused or  have trouble talking.  You have sudden changes in your vision. MAKE SURE YOU:  Understand these instructions.  Will watch your condition.  Will get help right away if you are not doing well or get worse. Document Released: 08/18/2011 Document Revised: 10/10/2013 Document Reviewed: 08/18/2011 [facility] Patient Information 2015 [place], [place]. This information is not intended to replace advice given to you by your health care provider. Make sure you discuss any questions you have with your health care provider.

## 2014-02-17 NOTE — ED Provider Notes (Signed)
Medical screening examination/treatment/procedure(s) were performed by resident physician or non-physician practitioner and as supervising physician I was immediately available for consultation/collaboration.   KINDL,JAMES DOUGLAS MD.   James D Kindl, MD 02/17/14 1521 

## 2014-02-17 NOTE — ED Provider Notes (Signed)
CSN: [id number]     Arrival date & time 02/17/14  1223 History   First MD Initiated Contact with Patient 02/17/14 1241     Chief Complaint  Patient presents with  . Blood Sugar Problem   (Consider location/radiation/quality/duration/timing/severity/associated sxs/prior Treatment) HPI Comments: 23 year old morbidly obese female presents with a concern for elevated blood sugar. She was told by the campus health care provider that her blood sugar was elevated today. Approximately 5 months ago in April of 2015 her physician told her that she had borderline diabetes and that she needed to lose weight. Patient states she did not lose any weight over the summer but she did decide to use her parents insulin as well as their sliding scale. The results of taking insulin is unknown. She reports 2 insulin checks today as 223 and 234. She had a period of time this AM in which she had a headache, burning and tingling of the right foot and some sweating.   Past Medical History  Diagnosis Date  . Asthma   . Borderline diabetes    Past Surgical History  Procedure Laterality Date  . Fracture surgery     History reviewed. No pertinent family history. History  Substance Use Topics  . Smoking status: Current Some Day Smoker  . Smokeless tobacco: Not on file  . Alcohol Use: No   OB History   Grav Para Term Preterm Abortions TAB SAB Ect Mult Living                 Review of Systems  Constitutional: Positive for diaphoresis and activity change. Negative for fever.  HENT: Positive for congestion and rhinorrhea.   Eyes: Negative.   Respiratory: Negative for cough and shortness of breath.   Cardiovascular: Negative.   Gastrointestinal: Positive for nausea.  Genitourinary: Negative.   Musculoskeletal: Negative.   Skin: Negative for rash.  Neurological: Positive for headaches. Negative for tremors and syncope.    Allergies  Benadryl  Home Medications   Prior to Admission medications    Medication Sig Start Date End Date Taking? Authorizing Provider  insulin NPH Human (HUMULIN N,NOVOLIN N) 100 UNIT/ML injection Inject 15 Units into the skin daily before breakfast. Uses per sliding scale    Historical Provider, MD  metFORMIN (GLUCOPHAGE) 500 MG tablet 1 tab po daily with the largest meal for the first week, then increase to 1 tab bid. 02/17/14   Hayden Rasmussen, NP   BP 157/95  Pulse 102  Temp(Src) 99.2 F (37.3 C) (Oral)  Resp 14  SpO2 99%  LMP 01/21/2014 Physical Exam  Nursing note and vitals reviewed. Constitutional: She is oriented to person, place, and time. She appears well-developed and well-nourished. No distress.  Eyes: Conjunctivae and EOM are normal.  Neck: Normal range of motion.  Cardiovascular: Normal rate, regular rhythm, normal heart sounds and intact distal pulses.   Pulmonary/Chest: Effort normal and breath sounds normal. No respiratory distress. She has no wheezes.  Musculoskeletal: She exhibits no edema.  Neurological: She is alert and oriented to person, place, and time. She exhibits normal muscle tone.  Skin: Skin is warm and dry.  Psychiatric: She has a normal mood and affect.    ED Course  Procedures (including critical care time) Labs Review Labs Reviewed  POCT URINALYSIS DIP (DEVICE) - Abnormal; Notable for the following:    Protein, ur 30 (*)    All other components within normal limits  POCT I-STAT, CHEM 8 - Abnormal; Notable for the following:  Glucose, Bld 177 (*)    All other components within normal limits    Imaging Review No results found. Results for orders placed during the hospital encounter of 02/17/14  POCT URINALYSIS DIP (DEVICE)      Result Value Ref Range   Glucose, UA NEGATIVE  NEGATIVE mg/dL   Bilirubin Urine NEGATIVE  NEGATIVE   Ketones, ur NEGATIVE  NEGATIVE mg/dL   Specific Gravity, Urine 1.025  1.005 - 1.030   Hgb urine dipstick NEGATIVE  NEGATIVE   pH 6.0  5.0 - 8.0   Protein, ur 30 (*) NEGATIVE mg/dL    Urobilinogen, UA 0.2  0.0 - 1.0 mg/dL   Nitrite NEGATIVE  NEGATIVE   Leukocytes, UA NEGATIVE  NEGATIVE  POCT I-STAT, CHEM 8      Result Value Ref Range   Sodium 137  137 - 147 mEq/L   Potassium 3.8  3.7 - 5.3 mEq/L   Chloride 102  96 - 112 mEq/L   BUN 10  6 - 23 mg/dL   Creatinine, Ser 1.61  0.50 - 1.10 mg/dL   Glucose, Bld 096 (*) 70 - 99 mg/dL   Calcium, Ion 0.45  4.09 - 1.23 mmol/L   TCO2 26  0 - 100 mmol/L   Hemoglobin 13.3  12.0 - 15.0 g/dL   HCT 81.1  91.4 - 78.2 %     MDM   1. Elevated serum glucose   2. History of elevated glucose   3. Morbid obesity    Metformin 5oo bid Instructions on elevated BS's Must obtain a PCP ASAP    Hayden Rasmussen, NP 02/17/14 1333

## 2014-02-17 NOTE — ED Notes (Signed)
Pt     Reports  She  Was  Told  About  5  Months  Ago    That  Her  Blood  Sugar   Was  Elevated  And  Was  Told to  Lose    Weight  In that  Time  Period  She  Took  Some  Of  Her  Parents  Insulin without medical  Supervision     She  Reports  She  Ws  Told  Her blood suf=gar  Was  Elevated  Today  At the  [facility]   And  She  Has  Foot  Pain and  Some  Sweating

## 2015-07-13 ENCOUNTER — Emergency Department (HOSPITAL_COMMUNITY)
Admission: EM | Admit: 2015-07-13 | Discharge: 2015-07-13 | Disposition: A | Discharge status: Home / Self Care | Payer: BLUE CROSS/BLUE SHIELD | Attending: Emergency Medicine | Admitting: Emergency Medicine

## 2015-07-13 ENCOUNTER — Encounter (HOSPITAL_COMMUNITY): Payer: Self-pay

## 2015-07-13 ENCOUNTER — Emergency Department (HOSPITAL_COMMUNITY): Payer: BLUE CROSS/BLUE SHIELD

## 2015-07-13 DIAGNOSIS — F17200 Nicotine dependence, unspecified, uncomplicated: Secondary | ICD-10-CM | POA: Insufficient documentation

## 2015-07-13 DIAGNOSIS — Z3202 Encounter for pregnancy test, result negative: Secondary | ICD-10-CM | POA: Insufficient documentation

## 2015-07-13 DIAGNOSIS — K219 Gastro-esophageal reflux disease without esophagitis: Secondary | ICD-10-CM | POA: Insufficient documentation

## 2015-07-13 DIAGNOSIS — E669 Obesity, unspecified: Secondary | ICD-10-CM | POA: Insufficient documentation

## 2015-07-13 DIAGNOSIS — Z794 Long term (current) use of insulin: Secondary | ICD-10-CM | POA: Insufficient documentation

## 2015-07-13 DIAGNOSIS — R0681 Apnea, not elsewhere classified: Secondary | ICD-10-CM

## 2015-07-13 DIAGNOSIS — Z79899 Other long term (current) drug therapy: Secondary | ICD-10-CM | POA: Insufficient documentation

## 2015-07-13 DIAGNOSIS — R1013 Epigastric pain: Secondary | ICD-10-CM

## 2015-07-13 DIAGNOSIS — J45909 Unspecified asthma, uncomplicated: Secondary | ICD-10-CM | POA: Insufficient documentation

## 2015-07-13 LAB — ETHANOL

## 2015-07-13 LAB — CBC
HEMATOCRIT: 36 % (ref 36.0–46.0)
HEMOGLOBIN: 10.7 g/dL — AB (ref 12.0–15.0)
MCH: 20.2 pg — ABNORMAL LOW (ref 26.0–34.0)
MCHC: 29.7 g/dL — ABNORMAL LOW (ref 30.0–36.0)
MCV: 67.9 fL — ABNORMAL LOW (ref 78.0–100.0)
Platelets: 400 10*3/uL (ref 150–400)
RBC: 5.3 MIL/uL — AB (ref 3.87–5.11)
RDW: 16.7 % — AB (ref 11.5–15.5)
WBC: 10 10*3/uL (ref 4.0–10.5)

## 2015-07-13 LAB — URINALYSIS, ROUTINE W REFLEX MICROSCOPIC
BILIRUBIN URINE: NEGATIVE
Glucose, UA: NEGATIVE mg/dL
Hgb urine dipstick: NEGATIVE
KETONES UR: NEGATIVE mg/dL
NITRITE: NEGATIVE
PH: 6 (ref 5.0–8.0)
PROTEIN: NEGATIVE mg/dL
Specific Gravity, Urine: 1.019 (ref 1.005–1.030)

## 2015-07-13 LAB — I-STAT TROPONIN, ED
Troponin i, poc: 0 ng/mL (ref 0.00–0.08)
Troponin i, poc: 0 ng/mL (ref 0.00–0.08)

## 2015-07-13 LAB — URINE MICROSCOPIC-ADD ON

## 2015-07-13 LAB — COMPREHENSIVE METABOLIC PANEL
ALBUMIN: 3.8 g/dL (ref 3.5–5.0)
ALK PHOS: 95 U/L (ref 38–126)
ALT: 19 U/L (ref 14–54)
AST: 23 U/L (ref 15–41)
Anion gap: 9 (ref 5–15)
BILIRUBIN TOTAL: 0.5 mg/dL (ref 0.3–1.2)
BUN: 9 mg/dL (ref 6–20)
CALCIUM: 9.5 mg/dL (ref 8.9–10.3)
CO2: 25 mmol/L (ref 22–32)
CREATININE: 0.72 mg/dL (ref 0.44–1.00)
Chloride: 105 mmol/L (ref 101–111)
GFR calc Af Amer: >60 mL/min (ref >60)
GFR calc non Af Amer: >60 mL/min (ref >60)
GLUCOSE: 153 mg/dL — AB (ref 65–99)
Potassium: 3.8 mmol/L (ref 3.5–5.1)
SODIUM: 139 mmol/L (ref 135–145)
TOTAL PROTEIN: 9.1 g/dL — AB (ref 6.5–8.1)

## 2015-07-13 LAB — DIFFERENTIAL
BASOS ABS: 0 10*3/uL (ref 0.0–0.1)
Basophils Relative: 0 %
EOS PCT: 1 %
Eosinophils Absolute: 0.1 10*3/uL (ref 0.0–0.7)
Lymphocytes Relative: 31 %
Lymphs Abs: 3.1 10*3/uL (ref 0.7–4.0)
Monocytes Absolute: 0.6 10*3/uL (ref 0.1–1.0)
Monocytes Relative: 6 %
Neutro Abs: 6.2 10*3/uL (ref 1.7–7.7)
Neutrophils Relative %: 62 %

## 2015-07-13 LAB — RAPID URINE DRUG SCREEN, HOSP PERFORMED
AMPHETAMINES: NOT DETECTED
BARBITURATES: NOT DETECTED
Benzodiazepines: NOT DETECTED
Cocaine: NOT DETECTED
OPIATES: NOT DETECTED
TETRAHYDROCANNABINOL: NOT DETECTED

## 2015-07-13 LAB — CBG MONITORING, ED: GLUCOSE-CAPILLARY: 139 mg/dL — AB (ref 65–99)

## 2015-07-13 LAB — POC URINE PREG, ED: Preg Test, Ur: NEGATIVE

## 2015-07-13 MED ORDER — GI COCKTAIL ~~LOC~~
30.0000 mL | Freq: Once | ORAL | Status: AC
  Administered 2015-07-13: 30 mL via ORAL
  Filled 2015-07-13: qty 30

## 2015-07-13 MED ORDER — OMEPRAZOLE 20 MG PO CPDR: 20.0000 mg | DELAYED_RELEASE_CAPSULE | Freq: Every day | ORAL | Status: DC

## 2015-07-13 MED ORDER — IPRATROPIUM-ALBUTEROL 0.5-2.5 (3) MG/3ML IN SOLN
3.0000 mL | Freq: Once | RESPIRATORY_TRACT | Status: AC
  Administered 2015-07-13: 3 mL via RESPIRATORY_TRACT
  Filled 2015-07-13: qty 3

## 2015-07-13 NOTE — ED Provider Notes (Signed)
CSN: [id number]     Arrival date & time 07/13/15  1112 History   First MD Initiated Contact with Patient 07/13/15 1118     Chief Complaint  Patient presents with  . Loss of Consciousness     (Consider location/radiation/quality/duration/timing/severity/associated sxs/prior Treatment) HPI   Patient is a 25 year old female who presents to the emergency room approximately 2-1/2 hours after she reportedly "stopped breathing" while she was in a class this morning. She also states that she may have passed out. She describes it as "nodding off" until she felt that she could hear voices around her but she could not wake herself up. All she could do was shake her left hand. She states that she gets poor sleep that is often tired throughout the day. Her partner is at the bedside states that she does snore loudly in frequently stops breathing at night. No prior sleep studies are formal diagnosis of sleep apnea.  Patient endorses left upper chest pain described as sharp. She states the chest pain is not worsened with positional changes, deep inspiration, exertion.  She denies any other associated symptoms with the event this morning. Specifically she denies palpitations, lower extremity edema, visual changes, headache, slurred speech, weakness.  She also denies wheezes, fever, cough, shortness of breath, URI symptoms.   Past Medical History  Diagnosis Date  . Asthma   . Borderline diabetes    Past Surgical History  Procedure Laterality Date  . Fracture surgery     History reviewed. No pertinent family history. Social History  Substance Use Topics  . Smoking status: Current Some Day Smoker  . Smokeless tobacco: None  . Alcohol Use: No   OB History    No data available     Review of Systems  All other systems reviewed and are negative.     Allergies  Benadryl  Home Medications   Prior to Admission medications   Medication Sig Start Date End Date Taking? Authorizing Provider   albuterol (PROVENTIL HFA;VENTOLIN HFA) 108 (90 Base) MCG/ACT inhaler Inhale 2 puffs into the lungs every 6 (six) hours as needed for wheezing or shortness of breath.   Yes Historical Provider, MD  insulin NPH Human (HUMULIN N,NOVOLIN N) 100 UNIT/ML injection Inject 15 Units into the skin daily before breakfast. Reported on 07/13/2015    Historical Provider, MD  metFORMIN (GLUCOPHAGE) 500 MG tablet 1 tab po daily with the largest meal for the first week, then increase to 1 tab bid. Patient not taking: Reported on 07/13/2015 02/17/14   Hayden Rasmussen, NP  omeprazole (PRILOSEC) 20 MG capsule Take 1 capsule (20 mg total) by mouth daily. 07/13/15   Danelle Berry, PA-C   BP 121/48 mmHg  Pulse 90  Temp(Src) 97.9 F (36.6 C) (Oral)  Resp 20  SpO2 100%  LMP 06/16/2015 Physical Exam  Constitutional: She is oriented to person, place, and time. She appears well-developed and well-nourished. No distress.  Obese female, NAD, patient laughing and joking with friends  in the ER exam room  HENT:  Head: Normocephalic and atraumatic.  Nose: Nose normal.  Mouth/Throat: Oropharynx is clear and moist. No oropharyngeal exudate.  Eyes: Conjunctivae and EOM are normal. Pupils are equal, round, and reactive to light. Right eye exhibits no discharge. Left eye exhibits no discharge. No scleral icterus.  Neck: Normal range of motion. No JVD present. No tracheal deviation present. No thyromegaly present.  Cardiovascular: Normal rate, regular rhythm, normal heart sounds and intact distal pulses.  Exam reveals no gallop and  no friction rub.   No murmur heard. Pulmonary/Chest: Effort normal and breath sounds normal. No respiratory distress. She has no wheezes. She has no rales. She exhibits no tenderness.  Abdominal: Soft. Bowel sounds are normal. She exhibits no distension and no mass. There is no tenderness. There is no rebound and no guarding.  Abdomen obese, soft  Musculoskeletal: Normal range of motion. She exhibits no edema or  tenderness.  Lymphadenopathy:    She has no cervical adenopathy.  Neurological: She is alert and oriented to person, place, and time. She has normal reflexes. No cranial nerve deficit. She exhibits normal muscle tone. Coordination normal.  Speech is clear and goal oriented, follows commands Major Cranial nerves without deficit, no facial droop Normal strength in upper and lower extremities bilaterally including dorsiflexion and plantar flexion, strong and equal grip strength Sensation normal to light and sharp touch Moves extremities without ataxia, coordination intact Normal finger to nose and rapid alternating movements Neg romberg, no pronator drift Normal gait and balance   Skin: Skin is warm and dry. No rash noted. She is not diaphoretic. No erythema. No pallor.  Psychiatric: She has a normal mood and affect. Her behavior is normal. Judgment and thought content normal.  Nursing note and vitals reviewed.   ED Course  Procedures (including critical care time) Labs Review Labs Reviewed  CBC - Abnormal; Notable for the following:    RBC 5.30 (*)    Hemoglobin 10.7 (*)    MCV 67.9 (*)    MCH 20.2 (*)    MCHC 29.7 (*)    RDW 16.7 (*)    All other components within normal limits  COMPREHENSIVE METABOLIC PANEL - Abnormal; Notable for the following:    Glucose, Bld 153 (*)    Total Protein 9.1 (*)    All other components within normal limits  URINALYSIS, ROUTINE W REFLEX MICROSCOPIC (NOT AT [facility]) - Abnormal; Notable for the following:    Leukocytes, UA SMALL (*)    All other components within normal limits  URINE MICROSCOPIC-ADD ON - Abnormal; Notable for the following:    Squamous Epithelial / LPF 0-5 (*)    Bacteria, UA RARE (*)    All other components within normal limits  CBG MONITORING, ED - Abnormal; Notable for the following:    Glucose-Capillary 139 (*)    All other components within normal limits  ETHANOL  DIFFERENTIAL  URINE RAPID DRUG SCREEN, HOSP PERFORMED   PROLACTIN  I-STAT TROPOININ, ED  POC URINE PREG, ED  I-STAT TROPOININ, ED    Imaging Review Dg Chest 2 View  07/13/2015  CLINICAL DATA:  Was at work and suddenly stopped breathing, felt dizzy, history of asthma, smoker EXAM: CHEST  2 VIEW COMPARISON:  None. FINDINGS: Mild enlargement of cardiac silhouette. Normal mediastinal contours and pulmonary vascularity. Lungs clear. No pleural effusion or pneumothorax. Bones unremarkable. IMPRESSION: No active cardiopulmonary disease. Electronically Signed   By: Ulyses Southward M.D.   On: 07/13/2015 12:39   I have personally reviewed and evaluated these images and lab results as part of my medical decision-making.   EKG Interpretation   Date/Time:  Friday July 13 2015 11:53:56 EST Ventricular Rate:  90 PR Interval:  131 QRS Duration: 102 QT Interval:  374 QTC Calculation: 458 R Axis:   67 Text Interpretation:  Sinus rhythm EKG WITHIN NORMAL LIMITS Confirmed by  LITTLE MD, RACHEL ([id number]) on 07/13/2015 11:58:17 AM      MDM   Pt presents to the ER  with CC syncope/LOC and "stopped breathing," although pt states she does not sleep well at night, is observed by her partner to have long breathing pauses while sleeping, and pt reports "nodding off" while in her class today.  Presentation appears more consistent with falling asleep in class with OSA given body habitus, however she has several complaints so cardiac and neuro workup initiated.    Physical exam was normal, without neuro deficits.  Pt's vitals are stable and within normal limits. Labs significant for microcytic anemia, consistent with prior labs and hx. She denies prior workup for anemia, was encouraged to follow up.  There is no acute drop on H/H suspected, no concern for blood loss.  Safe to follow up outpt with chronic issue, she was encouraged to supplement with iron. Pt also complained of dyspepsia, given GI cocktail in the ER, with some relief, PPI trial at discharge.  Her CP was  likely GI.  Delta troponin negative.  EKG normal sinus.  Pt was joking and laughing with her friends at the bedside throughout her time in the ER.  She is well appearing and VSS.  OSA was discussed with the pt, encouraged weight loss, and pt may need sleep study outpt to see if she qualifies for CPAP.  She was discharged in good condition, VSS. Filed Vitals:   07/13/15 1330 07/13/15 1400 07/13/15 1430 07/13/15 1621  BP: 107/59 107/62 114/71 121/48  Pulse: 84 92 94 90  Temp:      TempSrc:      Resp: SpO2: 100% 100% 100% 100%     Final diagnoses:  Gastroesophageal reflux disease, esophagitis presence not specified  Dyspepsia  Apnea     Danelle Berry, PA-C 07/20/15 1034  Lavera Guise, MD 07/20/15 1906

## 2015-07-13 NOTE — ED Notes (Signed)
Pt states sitting in class.  Pt felt her head getting heavy.  Pt felt she stopped breathing.  She could hear people but could not respond.  Family with patient states she does this at night and stops breathing in her sleep.  Pt states she did not recall what happened for 2-3 min after event.

## 2015-07-13 NOTE — ED Notes (Signed)
EDPA notified of patient  C/o CP and headache. EKG completed-NSR.

## 2015-07-13 NOTE — ED Notes (Signed)
Patient c/o left chest pain and headache that she rates 7/10. No increased SOB. Patient drifted off to sleep immediately after telling the nurse about her her chest pain and headache

## 2015-07-13 NOTE — Discharge Instructions (Signed)
Food Choices for Gastroesophageal Reflux Disease, Adult When you have gastroesophageal reflux disease (GERD), the foods you eat and your eating habits are very important. Choosing the right foods can help ease the discomfort of GERD. WHAT GENERAL GUIDELINES DO I NEED TO FOLLOW?  Choose fruits, vegetables, whole grains, low-fat dairy products, and low-fat meat, fish, and poultry.  Limit fats such as oils, salad dressings, butter, nuts, and avocado.  Keep a food diary to identify foods that cause symptoms.  Avoid foods that cause reflux. These may be different for different people.  Eat frequent small meals instead of three large meals each day.  Eat your meals slowly, in a relaxed setting.  Limit fried foods.  Cook foods using methods other than frying.  Avoid drinking alcohol.  Avoid drinking large amounts of liquids with your meals.  Avoid bending over or lying down until 2-3 hours after eating. WHAT FOODS ARE NOT RECOMMENDED? The following are some foods and drinks that may worsen your symptoms: Vegetables Tomatoes. Tomato juice. Tomato and spaghetti sauce. Chili peppers. Onion and garlic. Horseradish. Fruits Oranges, grapefruit, and lemon (fruit and juice). Meats High-fat meats, fish, and poultry. This includes hot dogs, ribs, ham, sausage, salami, and bacon. Dairy Whole milk and chocolate milk. Sour cream. Cream. Butter. Ice cream. Cream cheese.  Beverages Coffee and tea, with or without caffeine. Carbonated beverages or energy drinks. Condiments Hot sauce. Barbecue sauce.  Sweets/Desserts Chocolate and cocoa. Donuts. Peppermint and spearmint. Fats and Oils High-fat foods, including [place] fries and potato chips. Other Vinegar. Strong spices, such as black pepper, white pepper, red pepper, cayenne, curry powder, cloves, ginger, and chili powder. The items listed above may not be a complete list of foods and beverages to avoid. Contact your dietitian for more  information.   This information is not intended to replace advice given to you by your health care provider. Make sure you discuss any questions you have with your health care provider.   Document Released: 05/26/2005 Document Revised: 06/16/2014 Document Reviewed: 03/30/2013 Elsevier Interactive Patient Education 2016 Else[employer] Inc[employer]  Gastroesophageal Reflux Disease, Adult Normally, food travels down the esophagus and stays in the stomach to be digested. However, when a person has gastroesophageal reflux disease (GERD), food and stomach acid move back up into the esophagus. When this happens, the esophagus becomes sore and inflamed. Over time, GERD can create small holes (ulcers) in the lining of the esophagus.  CAUSES This condition is caused by a problem with the muscle between the esophagus and the stomach (lower esophageal sphincter, or LES). Normally, the LES muscle closes after food passes through the esophagus to the stomach. When the LES is weakened or abnormal, it does not close properly, and that allows food and stomach acid to go back up into the esophagus. The LES can be weakened by certain dietary substances, medicines, and medical conditions, including:  Tobacco use.  Pregnancy.  Having a hiatal hernia.  Heavy alcohol use.  Certain foods and beverages, such as coffee, chocolate, onions, and peppermint. RISK FACTORS This condition is more likely to develop in: 1. People who have an increased body weight. 2. People who have connective tissue disorders. 3. People who use NSAID medicines. SYMPTOMS Symptoms of this condition include:  Heartburn.  Difficult or painful swallowing.  The feeling of having a lump in the throat.  Abitter taste in the mouth.  Bad breath.  Having a large amount of saliva.  Having an upset or bloated stomach.  Belching.  Chest pain.  Shortness of breath or wheezing. °· Ongoing (chronic) cough or a night-time cough. °· Wearing away of  tooth enamel. °· Weight loss. °Different conditions can cause chest pain. Make sure to see your health care provider if you experience chest pain. °DIAGNOSIS °Your health care provider will take a medical history and perform a physical exam. To determine if you have mild or severe GERD, your health care provider may also monitor how you respond to treatment. You may also have other tests, including: °· An endoscopy to examine your stomach and esophagus with a small camera. °· A test that measures the acidity level in your esophagus. °· A test that measures how much pressure is on your esophagus. °· A barium swallow or modified barium swallow to show the shape, size, and functioning of your esophagus. °TREATMENT °The goal of treatment is to help relieve your symptoms and to prevent complications. Treatment for this condition may vary depending on how severe your symptoms are. Your health care provider may recommend: °· Changes to your diet. °· Medicine. °· Surgery. °HOME CARE INSTRUCTIONS °Diet °· Follow a diet as recommended by your health care provider. This may involve avoiding foods and drinks such as: °¨ Coffee and tea (with or without caffeine). °¨ Drinks that contain alcohol. °¨ Energy drinks and sports drinks. °¨ Carbonated drinks or sodas. °¨ Chocolate and cocoa. °¨ Peppermint and mint flavorings. °¨ Garlic and onions. °¨ Horseradish. °¨ Spicy and acidic foods, including peppers, chili powder, curry powder, vinegar, hot sauces, and barbecue sauce. °¨ Citrus fruit juices and citrus fruits, such as oranges, lemons, and limes. °¨ Tomato-based foods, such as red sauce, chili, salsa, and pizza with red sauce. °¨ Fried and fatty foods, such as donuts, french fries, potato chips, and high-fat dressings. °¨ High-fat meats, such as hot dogs and fatty cuts of red and white meats, such as rib eye steak, sausage, ham, and bacon. °¨ High-fat dairy items, such as whole milk, butter, and cream cheese. °· Eat small,  frequent meals instead of large meals. °· Avoid drinking large amounts of liquid with your meals. °· Avoid eating meals during the 2-3 hours before bedtime. °· Avoid lying down right after you eat. °· Do not exercise right after you eat. ° General Instructions  °· Pay attention to any changes in your symptoms. °· Take over-the-counter and prescription medicines only as told by your health care provider. Do not take aspirin, ibuprofen, or other NSAIDs unless your health care provider told you to do so. °· Do not use any tobacco products, including cigarettes, chewing tobacco, and e-cigarettes. If you need help quitting, ask your health care provider. °· Wear loose-fitting clothing. Do not wear anything tight around your waist that causes pressure on your abdomen. °· Raise (elevate) the head of your bed 6 inches (15cm). °· Try to reduce your stress, such as with yoga or meditation. If you need help reducing stress, ask your health care provider. °· If you are overweight, reduce your weight to an amount that is healthy for you. Ask your health care provider for guidance about a safe weight loss goal. °· Keep all follow-up visits as told by your health care provider. This is important. °SEEK MEDICAL CARE IF: °· You have new symptoms. °· You have unexplained weight loss. °· You have difficulty swallowing, or it hurts to swallow. °· You have wheezing or a persistent cough. °· Your symptoms do not improve with treatment. °· You have a hoarse voice. °SEEK IMMEDIATE MEDICAL CARE IF: °· You have pain   in your arms, neck, jaw, teeth, or back.  You feel sweaty, dizzy, or light-headed.  You have chest pain or shortness of breath.  You vomit and your vomit looks like blood or coffee grounds.  You faint.  Your stool is bloody or black.  You cannot swallow, drink, or eat.   This information is not intended to replace advice given to you by your health care provider. Make sure you discuss any questions you have with  your health care provider.   Document Released: 03/05/2005 Document Revised: 02/14/2015 Document Reviewed: 09/20/2014 Elsevier Interactive Patient Education 2016 Else[employer] Inc.  Heartburn Heartburn is a type of pain or discomfort that can happen in the throat or chest. It is often described as a burning pain. It may also cause a bad taste in the mouth. Heartburn may feel worse when you lie down or bend over, and it is often worse at night. Heartburn may be caused by stomach contents that move back up into the esophagus (reflux). HOME CARE INSTRUCTIONS Take these actions to decrease your discomfort and to help avoid complications. Diet  Follow a diet as recommended by your health care provider. This may involve avoiding foods and drinks such as:  Coffee and tea (with or without caffeine).  Drinks that contain alcohol.  Energy drinks and sports drinks.  Carbonated drinks or sodas.  Chocolate and cocoa.  Peppermint and mint flavorings.  Garlic and onions.  Horseradish.  Spicy and acidic foods, including peppers, chili powder, curry powder, vinegar, hot sauces, and barbecue sauce.  Citrus fruit juices and citrus fruits, such as oranges, lemons, and limes.  Tomato-based foods, such as red sauce, chili, salsa, and pizza with red sauce.  Fried and fatty foods, such as donuts, french fries, potato chips, and high-fat dressings.  High-fat meats, such as hot dogs and fatty cuts of red and white meats, such as rib eye steak, sausage, ham, and bacon.  High-fat dairy items, such as whole milk, butter, and cream cheese.  Eat small, frequent meals instead of large meals.  Avoid drinking large amounts of liquid with your meals.  Avoid eating meals during the 2-3 hours before bedtime.  Avoid lying down right after you eat.  Do not exercise right after you eat. General Instructions 4. Pay attention to any changes in your symptoms. 5. Take over-the-counter and prescription  medicines only as told by your health care provider. Do not take aspirin, ibuprofen, or other NSAIDs unless your health care provider told you to do so. 6. Do not use any tobacco products, including cigarettes, chewing tobacco, and e-cigarettes. If you need help quitting, ask your health care provider. 7. Wear loose-fitting clothing. Do not wear anything tight around your waist that causes pressure on your abdomen. 8. Raise (elevate) the head of your bed about 6 inches (15 cm). 9. Try to reduce your stress, such as with yoga or meditation. If you need help reducing stress, ask your health care provider. 10. If you are overweight, reduce your weight to an amount that is healthy for you. Ask your health care provider for guidance about a safe weight loss goal. 11. Keep all follow-up visits as told by your health care provider. This is important. SEEK MEDICAL CARE IF:  You have new symptoms.  You have unexplained weight loss.  You have difficulty swallowing, or it hurts to swallow.  You have wheezing or a persistent cough.  Your symptoms do not improve with treatment.  You have frequent heartburn for more  than two weeks. SEEK IMMEDIATE MEDICAL CARE IF:  You have pain in your arms, neck, jaw, teeth, or back.  You feel sweaty, dizzy, or light-headed.  You have chest pain or shortness of breath.  You vomit and your vomit looks like blood or coffee grounds.  Your stool is bloody or black.   This information is not intended to replace advice given to you by your health care provider. Make sure you discuss any questions you have with your health care provider.   Document Released: 10/12/2008 Document Revised: 02/14/2015 Document Reviewed: 09/20/2014 Elsevier Interactive Patient Education 2016 Elsevier Inc. Sleep Apnea  Sleep apnea is a sleep disorder characterized by abnormal pauses in breathing while you sleep. When your breathing pauses, the level of oxygen in your blood decreases.  This causes you to move out of deep sleep and into light sleep. As a result, your quality of sleep is poor, and the system that carries your blood throughout your body (cardiovascular system) experiences stress. If sleep apnea remains untreated, the following conditions can develop:  High blood pressure (hypertension).  Coronary artery disease.  Inability to achieve or maintain an erection (impotence).  Impairment of your thought process (cognitive dysfunction). There are three types of sleep apnea: 12. Obstructive sleep apnea--Pauses in breathing during sleep because of a blocked airway. 13. Central sleep apnea--Pauses in breathing during sleep because the area of the brain that controls your breathing does not send the correct signals to the muscles that control breathing. 14. Mixed sleep apnea--A combination of both obstructive and central sleep apnea. RISK FACTORS The following risk factors can increase your risk of developing sleep apnea:  Being overweight.  Smoking.  Having narrow passages in your nose and throat.  Being of older age.  Being female.  Alcohol use.  Sedative and tranquilizer use.  Ethnicity. Among individuals younger than 35 years, African Americans are at increased risk of sleep apnea. SYMPTOMS   Difficulty staying asleep.  Daytime sleepiness and fatigue.  Loss of energy.  Irritability.  Loud, heavy snoring.  Morning headaches.  Trouble concentrating.  Forgetfulness.  Decreased interest in sex.  Unexplained sleepiness. DIAGNOSIS  In order to diagnose sleep apnea, your caregiver will perform a physical examination. A sleep study done in the comfort of your own home may be appropriate if you are otherwise healthy. Your caregiver may also recommend that you spend the night in a sleep lab. In the sleep lab, several monitors record information about your heart, lungs, and brain while you sleep. Your leg and arm movements and blood oxygen level are  also recorded. TREATMENT The following actions may help to resolve mild sleep apnea:  Sleeping on your side.   Using a decongestant if you have nasal congestion.   Avoiding the use of depressants, including alcohol, sedatives, and narcotics.   Losing weight and modifying your diet if you are overweight. There also are devices and treatments to help open your airway:  Oral appliances. These are custom-made mouthpieces that shift your lower jaw forward and slightly open your bite. This opens your airway.  Devices that create positive airway pressure. This positive pressure "splints" your airway open to help you breathe better during sleep. The following devices create positive airway pressure:  Continuous positive airway pressure (CPAP) device. The CPAP device creates a continuous level of air pressure with an air pump. The air is delivered to your airway through a mask while you sleep. This continuous pressure keeps your airway open.  Nasal expiratory  positive airway pressure (EPAP) device. The EPAP device creates positive air pressure as you exhale. The device consists of single-use valves, which are inserted into each nostril and held in place by adhesive. The valves create very little resistance when you inhale but create much more resistance when you exhale. That increased resistance creates the positive airway pressure. This positive pressure while you exhale keeps your airway open, making it easier to breath when you inhale again.  Bilevel positive airway pressure (BPAP) device. The BPAP device is used mainly in patients with central sleep apnea. This device is similar to the CPAP device because it also uses an air pump to deliver continuous air pressure through a mask. However, with the BPAP machine, the pressure is set at two different levels. The pressure when you exhale is lower than the pressure when you inhale.  Surgery. Typically, surgery is only done if you cannot comply with  less invasive treatments or if the less invasive treatments do not improve your condition. Surgery involves removing excess tissue in your airway to create a wider passage way.   This information is not intended to replace advice given to you by your health care provider. Make sure you discuss any questions you have with your health care provider.   Document Released: 05/16/2002 Document Revised: 06/16/2014 Document Reviewed: 10/02/2011 Elsevier Interactive Patient Education [employer]

## 2015-07-14 LAB — PROLACTIN: PROLACTIN: 11.7 ng/mL (ref 4.8–23.3)

## 2016-03-15 ENCOUNTER — Emergency Department (HOSPITAL_COMMUNITY)
Admission: EM | Admit: 2016-03-15 | Discharge: 2016-03-15 | Disposition: A | Discharge status: Home / Self Care | Payer: BLUE CROSS/BLUE SHIELD | Attending: Emergency Medicine | Admitting: Emergency Medicine

## 2016-03-15 ENCOUNTER — Encounter (HOSPITAL_COMMUNITY): Payer: Self-pay | Admitting: Emergency Medicine

## 2016-03-15 DIAGNOSIS — R739 Hyperglycemia, unspecified: Secondary | ICD-10-CM

## 2016-03-15 DIAGNOSIS — Z794 Long term (current) use of insulin: Secondary | ICD-10-CM | POA: Insufficient documentation

## 2016-03-15 DIAGNOSIS — J45909 Unspecified asthma, uncomplicated: Secondary | ICD-10-CM | POA: Insufficient documentation

## 2016-03-15 DIAGNOSIS — Z7984 Long term (current) use of oral hypoglycemic drugs: Secondary | ICD-10-CM | POA: Insufficient documentation

## 2016-03-15 DIAGNOSIS — F17200 Nicotine dependence, unspecified, uncomplicated: Secondary | ICD-10-CM | POA: Insufficient documentation

## 2016-03-15 DIAGNOSIS — H01002 Unspecified blepharitis right lower eyelid: Secondary | ICD-10-CM | POA: Insufficient documentation

## 2016-03-15 DIAGNOSIS — E119 Type 2 diabetes mellitus without complications: Secondary | ICD-10-CM | POA: Insufficient documentation

## 2016-03-15 DIAGNOSIS — E1165 Type 2 diabetes mellitus with hyperglycemia: Secondary | ICD-10-CM | POA: Insufficient documentation

## 2016-03-15 HISTORY — DX: Type 2 diabetes mellitus without complications: E11.9

## 2016-03-15 LAB — CBG MONITORING, ED: Glucose-Capillary: 291 mg/dL — ABNORMAL HIGH (ref 65–99)

## 2016-03-15 MED ORDER — POLYMYXIN B-TRIMETHOPRIM 10000-0.1 UNIT/ML-% OP SOLN: 1.0000 [drp] | OPHTHALMIC | 0 refills | Status: DC

## 2016-03-15 MED ORDER — ACETAMINOPHEN 325 MG PO TABS
650.0000 mg | ORAL_TABLET | Freq: Once | ORAL | Status: AC
  Administered 2016-03-15: 650 mg via ORAL
  Filled 2016-03-15: qty 2

## 2016-03-15 MED ORDER — METFORMIN HCL 500 MG PO TABS: 500.0000 mg | ORAL_TABLET | Freq: Two times a day (BID) | ORAL | 0 refills | Status: DC

## 2016-03-15 NOTE — ED Provider Notes (Signed)
[facility]-EMERGENCY DEPT Provider Note   CSN: [id number] Arrival date & time: 03/15/16  1120     History   Chief Complaint Chief Complaint  Patient presents with  . Eye Pain    HPI Diana Joyce is a 25 y.o. female.  HPI Patient presents to the emergency room for evaluation of eye irritation.  Patient states that she noticed irritation and redness to the upper eyelid on the right eye. The last week. She's also had frequent crusting drainage. Patient denies any eye pain. The area is irritated and itchy. She works at [employer] and was told to come to the emergency room to make sure she was not infectious. She also history of diabetes and has had elevated blood sugars recently. She denies any other symptoms associated with that Past Medical History:  Diagnosis Date  . Asthma   . Borderline diabetes     There are no active problems to display for this patient.   Past Surgical History:  Procedure Laterality Date  . FRACTURE SURGERY      OB History    No data available       Home Medications    Prior to Admission medications   Medication Sig Start Date End Date Taking? Authorizing Provider  albuterol (PROVENTIL HFA;VENTOLIN HFA) 108 (90 Base) MCG/ACT inhaler Inhale 2 puffs into the lungs every 6 (six) hours as needed for wheezing or shortness of breath.    Historical Provider, MD  insulin NPH Human (HUMULIN N,NOVOLIN N) 100 UNIT/ML injection Inject 15 Units into the skin daily before breakfast. Reported on 07/13/2015    Historical Provider, MD  metFORMIN (GLUCOPHAGE) 500 MG tablet 1 tab po daily with the largest meal for the first week, then increase to 1 tab bid. Patient not taking: Reported on 07/13/2015 02/17/14   Hayden Rasmussenavid Mabe, NP  omeprazole (PRILOSEC) 20 MG capsule Take 1 capsule (20 mg total) by mouth daily. 07/13/15   Danelle BerryLeisa Tapia, PA-C  trimethoprim-polymyxin b (POLYTRIM) ophthalmic solution Place 1 drop into the right eye every 4 (four) hours. 03/15/16   Linwood DibblesJon Taleen Prosser, MD    Family  History No family history on file.  Social History Social History  Substance Use Topics  . Smoking status: Current Some Day Smoker  . Smokeless tobacco: Current User  . Alcohol use No     Allergies   Benadryl [diphenhydramine hcl]   Review of Systems Review of Systems  All other systems reviewed and are negative.    Physical Exam Updated Vital Signs BP 131/79 (BP Location: Right Arm)   Pulse 102   Temp 98.2 F (36.8 C) (Oral)   Resp 18   Ht 5\' 5"  (1.651 m)   Wt (!) 141.7 kg   LMP 02/16/2016   SpO2 97%   BMI 52.00 kg/m   Physical Exam  Constitutional: She appears well-developed and well-nourished. No distress.  HENT:  Head: Normocephalic and atraumatic.  Right Ear: External ear normal.  Left Ear: External ear normal.  Eyes: Conjunctivae are normal. Right eye exhibits hordeolum. Right eye exhibits no chemosis, no discharge and no exudate. Left eye exhibits no chemosis, no discharge and no exudate. No scleral icterus.    Neck: Neck supple. No tracheal deviation present.  Cardiovascular: Normal rate.   Pulmonary/Chest: Effort normal. No stridor. No respiratory distress.  Abdominal: She exhibits no distension.  Musculoskeletal: She exhibits no edema.  Neurological: She is alert. Cranial nerve deficit: no gross deficits.  Skin: Skin is warm and dry. No rash noted.  Psychiatric: She has a normal mood and affect.  Nursing note and vitals reviewed.    ED Treatments / Results  Labs (all labs ordered are listed, but only abnormal results are displayed) Labs Reviewed  CBG MONITORING, ED - Abnormal; Notable for the following:       Result Value   Glucose-Capillary 291 (*)    All other components within normal limits     Radiology No results found.  Procedures Procedures (including critical care time)  Medications Ordered in ED Medications - No data to display   Initial Impression / Assessment and Plan / ED Course  I have reviewed the triage vital  signs and the nursing notes.  Pertinent labs & imaging results that were available during my care of the patient were reviewed by me and considered in my medical decision making (see chart for details).  Clinical Course    Possible blepharitis versus stye formation. We'll start the patient on antibiotic eyedrops and recommend warm compresses. Follow up with an eye doctor next week if the symptoms persist  Patient is hyperglycemic but otherwise asymptomatic. Patient does not actually take insulin or metformin. Her parents are diabetics. Several years ago she checked her blood sugar was elevated above the 200s. She took some of her parents insulin.  She did follow up at health clinic once at school and was told she needed to lose weight. She was never formally told she was a diabetic.  I told the patient that she is a diabetic with a blood sugar of 291. She needs to follow up with primary care doctor. She can certainly continue with weight loss and diet monitoring. I will give her prescription for metformin  Final Clinical Impressions(s) / ED Diagnoses   Final diagnoses:  Blepharitis of right lower eyelid, unspecified type  Hyperglycemia    New Prescriptions New Prescriptions   METFORMIN (GLUCOPHAGE) 500 MG TABLET    Take 1 tablet (500 mg total) by mouth 2 (two) times daily with a meal.   TRIMETHOPRIM-POLYMYXIN B (POLYTRIM) OPHTHALMIC SOLUTION    Place 1 drop into the right eye every 4 (four) hours.     Linwood Dibbles, MD 03/15/16 [id number]

## 2016-03-15 NOTE — Discharge Instructions (Signed)
Follow-up with an eye doctor next week if the symptoms persist, use the eyedrops and apply warm compresses; make sure to follow up with her primary care doctor regarding your high blood sugar/diabetes

## 2016-03-15 NOTE — ED Notes (Signed)
Pt reports having Hx of CBG of 578 this week

## 2016-03-15 NOTE — ED Notes (Signed)
CBG 291; RN notified

## 2016-03-15 NOTE — ED Triage Notes (Signed)
Pt. Stated, I've had eye pain for the last week, It is crusty when you get up and yellow pus. Eye pain is the worse.

## 2016-09-02 ENCOUNTER — Emergency Department (HOSPITAL_COMMUNITY)
Admission: EM | Admit: 2016-09-02 | Discharge: 2016-09-02 | Disposition: A | Discharge status: Home / Self Care | Payer: BLUE CROSS/BLUE SHIELD | Attending: Emergency Medicine | Admitting: Emergency Medicine

## 2016-09-02 ENCOUNTER — Encounter (HOSPITAL_COMMUNITY): Payer: Self-pay

## 2016-09-02 DIAGNOSIS — Z7984 Long term (current) use of oral hypoglycemic drugs: Secondary | ICD-10-CM | POA: Insufficient documentation

## 2016-09-02 DIAGNOSIS — Z79899 Other long term (current) drug therapy: Secondary | ICD-10-CM | POA: Insufficient documentation

## 2016-09-02 DIAGNOSIS — J45909 Unspecified asthma, uncomplicated: Secondary | ICD-10-CM | POA: Insufficient documentation

## 2016-09-02 DIAGNOSIS — F17290 Nicotine dependence, other tobacco product, uncomplicated: Secondary | ICD-10-CM | POA: Insufficient documentation

## 2016-09-02 DIAGNOSIS — E119 Type 2 diabetes mellitus without complications: Secondary | ICD-10-CM | POA: Insufficient documentation

## 2016-09-02 DIAGNOSIS — K529 Noninfective gastroenteritis and colitis, unspecified: Secondary | ICD-10-CM | POA: Insufficient documentation

## 2016-09-02 LAB — COMPREHENSIVE METABOLIC PANEL
ALK PHOS: 113 U/L (ref 38–126)
ALT: 21 U/L (ref 14–54)
AST: 23 U/L (ref 15–41)
Albumin: 3.3 g/dL — ABNORMAL LOW (ref 3.5–5.0)
Anion gap: 5 (ref 5–15)
BILIRUBIN TOTAL: 0.4 mg/dL (ref 0.3–1.2)
BUN: 7 mg/dL (ref 6–20)
CO2: 25 mmol/L (ref 22–32)
CREATININE: 0.57 mg/dL (ref 0.44–1.00)
Calcium: 8.3 mg/dL — ABNORMAL LOW (ref 8.9–10.3)
Chloride: 106 mmol/L (ref 101–111)
GFR calc Af Amer: >60 mL/min (ref >60)
GFR calc non Af Amer: >60 mL/min (ref >60)
GLUCOSE: 292 mg/dL — AB (ref 65–99)
POTASSIUM: 4 mmol/L (ref 3.5–5.1)
Sodium: 136 mmol/L (ref 135–145)
TOTAL PROTEIN: 7.2 g/dL (ref 6.5–8.1)

## 2016-09-02 LAB — CBC
HEMATOCRIT: 36.7 % (ref 36.0–46.0)
Hemoglobin: 11.5 g/dL — ABNORMAL LOW (ref 12.0–15.0)
MCH: 22 pg — ABNORMAL LOW (ref 26.0–34.0)
MCHC: 31.3 g/dL (ref 30.0–36.0)
MCV: 70.3 fL — ABNORMAL LOW (ref 78.0–100.0)
PLATELETS: 356 10*3/uL (ref 150–400)
RBC: 5.22 MIL/uL — ABNORMAL HIGH (ref 3.87–5.11)
RDW: 15.8 % — AB (ref 11.5–15.5)
WBC: 7 10*3/uL (ref 4.0–10.5)

## 2016-09-02 LAB — CBG MONITORING, ED: GLUCOSE-CAPILLARY: 264 mg/dL — AB (ref 65–99)

## 2016-09-02 LAB — LIPASE, BLOOD: Lipase: 18 U/L (ref 11–51)

## 2016-09-02 LAB — URINALYSIS, ROUTINE W REFLEX MICROSCOPIC
BACTERIA UA: NONE SEEN
Bilirubin Urine: NEGATIVE
Glucose, UA: >=500 mg/dL — AB
Ketones, ur: NEGATIVE mg/dL
Leukocytes, UA: NEGATIVE
NITRITE: NEGATIVE
Protein, ur: NEGATIVE mg/dL
SPECIFIC GRAVITY, URINE: 1.029 (ref 1.005–1.030)
pH: 7 (ref 5.0–8.0)

## 2016-09-02 LAB — I-STAT BETA HCG BLOOD, ED (MC, WL, AP ONLY): I-stat hCG, quantitative: <5 m[IU]/mL (ref <5)

## 2016-09-02 MED ORDER — ONDANSETRON 4 MG PO TBDP
4.0000 mg | ORAL_TABLET | Freq: Once | ORAL | Status: AC
  Administered 2016-09-02: 4 mg via ORAL
  Filled 2016-09-02: qty 1

## 2016-09-02 MED ORDER — ONDANSETRON 4 MG PO TBDP: 4.0000 mg | ORAL_TABLET | ORAL | 0 refills | Status: DC | PRN

## 2016-09-02 MED ORDER — LOPERAMIDE HCL 2 MG PO CAPS: 2.0000 mg | ORAL_CAPSULE | Freq: Four times a day (QID) | ORAL | 0 refills | Status: DC | PRN

## 2016-09-02 MED ORDER — FAMOTIDINE 20 MG PO TABS: 20.0000 mg | ORAL_TABLET | Freq: Two times a day (BID) | ORAL | 0 refills | Status: DC

## 2016-09-02 MED ORDER — FAMOTIDINE 20 MG PO TABS
20.0000 mg | ORAL_TABLET | Freq: Once | ORAL | Status: AC
  Administered 2016-09-02: 20 mg via ORAL
  Filled 2016-09-02: qty 1

## 2016-09-02 NOTE — ED Provider Notes (Signed)
[facility]-EMERGENCY DEPT Provider Note   CSN: [id number] Arrival date & time: 09/02/16  1508     History   Chief Complaint Chief Complaint  Patient presents with  . Abdominal Pain  . Emesis  . Diarrhea  . Headache  . Hyperglycemia    HPI Diana Joyce is a 26 y.o. female.  HPI Patient reports 4 days ago she started developing both vomiting and diarrhea. She reports that she works at [employer] and she had had a steak and cheese sandwich. She is not sure if maybe she started getting sick from the food. She reports family members and contacts are developing similar symptoms as well. She states she's had episodes of cold chills and sweats. She has had some epigastric central abdominal pain cramping in nature. No documented fever. She reports no pain or burning with urination. She reports she is finishing her menstrual cycle. No abnormal vaginal discharge. Past Medical History:  Diagnosis Date  . Asthma   . Diabetes [facility])     Patient Active Problem List   Diagnosis Date Noted  . Diabetes [facility])     Past Surgical History:  Procedure Laterality Date  . FRACTURE SURGERY      OB History    No data available       Home Medications    Prior to Admission medications   Medication Sig Start Date End Date Taking? Authorizing Provider  acetaminophen (TYLENOL) 325 MG tablet Take 650 mg by mouth every 6 (six) hours as needed.   Yes Historical Provider, MD  albuterol (PROVENTIL HFA;VENTOLIN HFA) 108 (90 Base) MCG/ACT inhaler Inhale 2 puffs into the lungs every 6 (six) hours as needed for wheezing or shortness of breath.   Yes Historical Provider, MD  APAP-Pamabrom-Pyrilamine (PAMPRIN MULTI-SYMPTOM PO) Take 1 tablet by mouth daily.   Yes Historical Provider, MD  famotidine (PEPCID) 20 MG tablet Take 1 tablet (20 mg total) by mouth 2 (two) times daily. 09/02/16   Arby Barrette, MD  loperamide (IMODIUM) 2 MG capsule Take 1 capsule (2 mg total) by mouth 4 (four) times daily as needed for  diarrhea or loose stools. 09/02/16   Arby Barrette, MD  metFORMIN (GLUCOPHAGE) 500 MG tablet Take 1 tablet (500 mg total) by mouth 2 (two) times daily with a meal. Patient not taking: Reported on 09/02/2016 03/15/16   Linwood Dibbles, MD  omeprazole (PRILOSEC) 20 MG capsule Take 1 capsule (20 mg total) by mouth daily. Patient not taking: Reported on 09/02/2016 07/13/15   Danelle Berry, PA-C  ondansetron (ZOFRAN ODT) 4 MG disintegrating tablet Take 1 tablet (4 mg total) by mouth every 4 (four) hours as needed for nausea or vomiting. 09/02/16   Arby Barrette, MD    Family History Family History  Problem Relation Age of Onset  . Kidney failure Mother   . Hypertension Mother   . Diabetes Mother   . Heart failure Mother   . Diabetes Father     Social History Social History  Substance Use Topics  . Smoking status: Current Some Day Smoker    Types: Cigars  . Smokeless tobacco: Never Used  . Alcohol use No     Allergies   Benadryl [diphenhydramine hcl]   Review of Systems Review of Systems 10 Systems reviewed and are negative for acute change except as noted in the HPI.   Physical Exam Updated Vital Signs BP 138/67   Pulse 86   Temp 97.9 F (36.6 C) (Oral)   Resp 18   LMP 09/02/2016  SpO2 100%   Physical Exam  Constitutional: She is oriented to person, place, and time.  Patient alert and nontoxic. No respiratory distress. Morbid obesity.  HENT:  Nose: Nose normal.  Mouth/Throat: Oropharynx is clear and moist.  Eyes: Conjunctivae and EOM are normal.  Cardiovascular: Normal rate, regular rhythm, normal heart sounds and intact distal pulses.   Pulmonary/Chest: Effort normal and breath sounds normal.  Abdominal: Soft. Bowel sounds are normal. She exhibits no distension and no mass. There is tenderness. There is no guarding.  Mild epigastric tenderness without guarding.  Musculoskeletal: Normal range of motion. She exhibits no edema or tenderness.  Neurological: She is alert and  oriented to person, place, and time. No cranial nerve deficit. She exhibits normal muscle tone. Coordination normal.  Skin: Skin is warm and dry.  Psychiatric: She has a normal mood and affect.     ED Treatments / Results  Labs (all labs ordered are listed, but only abnormal results are displayed) Labs Reviewed  COMPREHENSIVE METABOLIC PANEL - Abnormal; Notable for the following:       Result Value   Glucose, Bld 292 (*)    Calcium 8.3 (*)    Albumin 3.3 (*)    All other components within normal limits  CBC - Abnormal; Notable for the following:    RBC 5.22 (*)    Hemoglobin 11.5 (*)    MCV 70.3 (*)    MCH 22.0 (*)    RDW 15.8 (*)    All other components within normal limits  URINALYSIS, ROUTINE W REFLEX MICROSCOPIC - Abnormal; Notable for the following:    APPearance HAZY (*)    Glucose, UA >=500 (*)    Hgb urine dipstick LARGE (*)    Squamous Epithelial / LPF 0-5 (*)    All other components within normal limits  CBG MONITORING, ED - Abnormal; Notable for the following:    Glucose-Capillary 264 (*)    All other components within normal limits  LIPASE, BLOOD  I-STAT BETA HCG BLOOD, ED ([facility], [facility], [facility] ONLY)    EKG  EKG Interpretation None       Radiology No results found.  Procedures Procedures (including critical care time)  Medications Ordered in ED Medications  famotidine (PEPCID) tablet 20 mg (not administered)  ondansetron (ZOFRAN-ODT) disintegrating tablet 4 mg (not administered)     Initial Impression / Assessment and Plan / ED Course  I have reviewed the triage vital signs and the nursing notes.  Pertinent labs & imaging results that were available during my care of the patient were reviewed by me and considered in my medical decision making (see chart for details).       Final Clinical Impressions(s) / ED Diagnoses   Final diagnoses:  Gastroenteritis   Patient's symptoms consistent with a viral gastroenteritis. Her close contacts are  developing the same symptoms. She has had 4 days of both vomiting and diarrheal illness with chills and sweats. Patient describes epigastric pain but has a nonsurgical abdomen. She is clinically well in appearance. Labs do not indicate significant dehydration. Patient will be given Zofran and Pepcid for symptoms and Imodium if needed. She is counseled on continued oral fluid intake. New Prescriptions New Prescriptions   FAMOTIDINE (PEPCID) 20 MG TABLET    Take 1 tablet (20 mg total) by mouth 2 (two) times daily.   LOPERAMIDE (IMODIUM) 2 MG CAPSULE    Take 1 capsule (2 mg total) by mouth 4 (four) times daily as needed for diarrhea or  loose stools.   ONDANSETRON (ZOFRAN ODT) 4 MG DISINTEGRATING TABLET    Take 1 tablet (4 mg total) by mouth every 4 (four) hours as needed for nausea or vomiting.     Arby Barrette, MD 09/02/16 [phone]

## 2016-09-02 NOTE — ED Triage Notes (Signed)
Patient c/o N/V/D, mid abdominal pain, and chills x 4 days.

## 2016-09-12 ENCOUNTER — Encounter (HOSPITAL_COMMUNITY): Payer: Self-pay | Admitting: Emergency Medicine

## 2016-09-12 ENCOUNTER — Emergency Department (HOSPITAL_COMMUNITY): Payer: BLUE CROSS/BLUE SHIELD

## 2016-09-12 ENCOUNTER — Emergency Department (HOSPITAL_COMMUNITY)
Admission: EM | Admit: 2016-09-12 | Discharge: 2016-09-13 | Disposition: A | Discharge status: Home / Self Care | Payer: BLUE CROSS/BLUE SHIELD | Attending: Emergency Medicine | Admitting: Emergency Medicine

## 2016-09-12 DIAGNOSIS — Z79899 Other long term (current) drug therapy: Secondary | ICD-10-CM | POA: Insufficient documentation

## 2016-09-12 DIAGNOSIS — F17290 Nicotine dependence, other tobacco product, uncomplicated: Secondary | ICD-10-CM | POA: Insufficient documentation

## 2016-09-12 DIAGNOSIS — J189 Pneumonia, unspecified organism: Secondary | ICD-10-CM

## 2016-09-12 DIAGNOSIS — Z7984 Long term (current) use of oral hypoglycemic drugs: Secondary | ICD-10-CM | POA: Insufficient documentation

## 2016-09-12 DIAGNOSIS — E119 Type 2 diabetes mellitus without complications: Secondary | ICD-10-CM | POA: Insufficient documentation

## 2016-09-12 DIAGNOSIS — J45909 Unspecified asthma, uncomplicated: Secondary | ICD-10-CM | POA: Insufficient documentation

## 2016-09-12 DIAGNOSIS — J181 Lobar pneumonia, unspecified organism: Secondary | ICD-10-CM | POA: Insufficient documentation

## 2016-09-12 LAB — CBG MONITORING, ED: GLUCOSE-CAPILLARY: 395 mg/dL — AB (ref 65–99)

## 2016-09-12 MED ORDER — HYDROCODONE-HOMATROPINE 5-1.5 MG/5ML PO SYRP: 5.0000 mL | ORAL_SOLUTION | Freq: Four times a day (QID) | ORAL | 0 refills | Status: DC | PRN

## 2016-09-12 MED ORDER — ALBUTEROL SULFATE HFA 108 (90 BASE) MCG/ACT IN AERS
1.0000 | INHALATION_SPRAY | Freq: Once | RESPIRATORY_TRACT | Status: AC
  Administered 2016-09-12: 2 via RESPIRATORY_TRACT
  Filled 2016-09-12: qty 6.7

## 2016-09-12 MED ORDER — AZITHROMYCIN 250 MG PO TABS
500.0000 mg | ORAL_TABLET | Freq: Once | ORAL | Status: AC
  Administered 2016-09-12: 500 mg via ORAL
  Filled 2016-09-12: qty 2

## 2016-09-12 MED ORDER — PREDNISONE 10 MG PO TABS: 20.0000 mg | ORAL_TABLET | Freq: Every day | ORAL | 0 refills | Status: DC

## 2016-09-12 MED ORDER — IPRATROPIUM-ALBUTEROL 0.5-2.5 (3) MG/3ML IN SOLN
3.0000 mL | Freq: Once | RESPIRATORY_TRACT | Status: AC
  Administered 2016-09-12: 3 mL via RESPIRATORY_TRACT
  Filled 2016-09-12: qty 3

## 2016-09-12 MED ORDER — AZITHROMYCIN 250 MG PO TABS: ORAL_TABLET | ORAL | 0 refills | Status: DC

## 2016-09-12 MED ORDER — HYDROCOD POLST-CPM POLST ER 10-8 MG/5ML PO SUER
5.0000 mL | Freq: Once | ORAL | Status: AC
  Administered 2016-09-12: 5 mL via ORAL
  Filled 2016-09-12: qty 5

## 2016-09-12 MED ORDER — PREDNISONE 20 MG PO TABS
40.0000 mg | ORAL_TABLET | Freq: Once | ORAL | Status: AC
  Administered 2016-09-12: 40 mg via ORAL
  Filled 2016-09-12: qty 2

## 2016-09-12 NOTE — ED Triage Notes (Signed)
Pt c/o headache, productive cough, generalized body aches. Pt was seen earlier this week and states "been sick for past 2 weeks and I keep coming back." Pt reports subjective fever this morning but did not check temp or take any medicine. Pt feels like she is wheezing, lung sounds clear.

## 2016-09-12 NOTE — Discharge Instructions (Signed)
You have a very small amount of pneumonia. Prescription for antibiotic, cough syrup, prednisone. Use your inhaler as needed. Increase fluids. Tylenol for fever. Rest.

## 2016-09-12 NOTE — ED Notes (Signed)
Made Respiratory aware of neb treatment ordered.  

## 2016-09-12 NOTE — ED Provider Notes (Signed)
[facility]-EMERGENCY DEPT Provider Note   CSN: [id number] Arrival date & time: 09/12/16  1617     History   Chief Complaint Chief Complaint  Patient presents with  . Cough    HPI Diana Joyce is a 26 y.o. female.  Cough for 24 hours. Prodromal intestinal flu with nausea, vomiting, diarrhea 2 weeks ago. Patient is obese and diabetic. Review of systems positive for wheezing and productive sputum. Severity is moderate.      Past Medical History:  Diagnosis Date  . Asthma   . Diabetes [facility])     Patient Active Problem List   Diagnosis Date Noted  . Diabetes [facility])     Past Surgical History:  Procedure Laterality Date  . FRACTURE SURGERY      OB History    No data available       Home Medications    Prior to Admission medications   Medication Sig Start Date End Date Taking? Authorizing Provider  pseudoephedrine (SUDAFED) 30 MG tablet Take 30 mg by mouth every 4 (four) hours as needed for congestion.   Yes Historical Provider, MD  azithromycin (ZITHROMAX) 250 MG tablet Take one daily starting Saturday evening 09/12/16   Donnetta Hutching, MD  famotidine (PEPCID) 20 MG tablet Take 1 tablet (20 mg total) by mouth 2 (two) times daily. Patient not taking: Reported on 09/12/2016 09/02/16   Arby Barrette, MD  HYDROcodone-homatropine [facility]) 5-1.5 MG/5ML syrup Take 5 mLs by mouth every 6 (six) hours as needed for cough. 09/12/16   Donnetta Hutching, MD  loperamide (IMODIUM) 2 MG capsule Take 1 capsule (2 mg total) by mouth 4 (four) times daily as needed for diarrhea or loose stools. Patient not taking: Reported on 09/12/2016 09/02/16   Arby Barrette, MD  metFORMIN (GLUCOPHAGE) 500 MG tablet Take 1 tablet (500 mg total) by mouth 2 (two) times daily with a meal. Patient not taking: Reported on 09/02/2016 03/15/16   Linwood Dibbles, MD  omeprazole (PRILOSEC) 20 MG capsule Take 1 capsule (20 mg total) by mouth daily. Patient not taking: Reported on 09/02/2016 07/13/15   Danelle Berry, PA-C  ondansetron (ZOFRAN  ODT) 4 MG disintegrating tablet Take 1 tablet (4 mg total) by mouth every 4 (four) hours as needed for nausea or vomiting. Patient not taking: Reported on 09/12/2016 09/02/16   Arby Barrette, MD  predniSONE (DELTASONE) 10 MG tablet Take 2 tablets (20 mg total) by mouth daily. 09/12/16   Donnetta Hutching, MD    Family History Family History  Problem Relation Age of Onset  . Kidney failure Mother   . Hypertension Mother   . Diabetes Mother   . Heart failure Mother   . Diabetes Father     Social History Social History  Substance Use Topics  . Smoking status: Current Some Day Smoker    Types: Cigars  . Smokeless tobacco: Never Used  . Alcohol use No     Allergies   Benadryl [diphenhydramine hcl]   Review of Systems Review of Systems  All other systems reviewed and are negative.    Physical Exam Updated Vital Signs BP (!) 144/62 (BP Location: Right Arm)   Pulse (!) 106   Temp 98.3 F (36.8 C) (Oral)   Resp 20   LMP 09/02/2016   SpO2 92%   Physical Exam  Constitutional: She is oriented to person, place, and time.  Obese  HENT:  Head: Normocephalic and atraumatic.  Eyes: Conjunctivae are normal.  Neck: Neck supple.  Cardiovascular: Normal rate and regular  rhythm.   Pulmonary/Chest: Effort normal.  Minimal expiratory wheeze  Abdominal: Soft. Bowel sounds are normal.  Musculoskeletal: Normal range of motion.  Neurological: She is alert and oriented to person, place, and time.  Skin: Skin is warm and dry.  Psychiatric: She has a normal mood and affect. Her behavior is normal.  Nursing note and vitals reviewed.    ED Treatments / Results  Labs (all labs ordered are listed, but only abnormal results are displayed) Labs Reviewed  CBG MONITORING, ED - Abnormal; Notable for the following:       Result Value   Glucose-Capillary 395 (*)    All other components within normal limits    EKG  EKG Interpretation None       Radiology Dg Chest 2 View  Result Date:  09/12/2016 CLINICAL DATA:  Productive cough. EXAM: CHEST  2 VIEW COMPARISON:  07/13/2015. FINDINGS: Mediastinum hilar structures are normal. Heart size stable. Low lung volumes with mild basilar atelectasis. Very mild infiltrate right lung base cannot be excluded. No pleural effusion or pneumothorax. No acute bony abnormality. IMPRESSION: Low lung volumes with mild basilar atelectasis. Very mild infiltrate right lung base cannot be excluded. Electronically Signed   By: Maisie Fus  Register   On: 09/12/2016 17:13    Procedures Procedures (including critical care time)  Medications Ordered in ED Medications  ipratropium-albuterol (DUONEB) 0.5-2.5 (3) MG/3ML nebulizer solution 3 mL (3 mLs Nebulization Given 09/12/16 1758)  chlorpheniramine-HYDROcodone (TUSSIONEX) 10-8 MG/5ML suspension 5 mL (5 mLs Oral Given 09/12/16 1758)  predniSONE (DELTASONE) tablet 40 mg (40 mg Oral Given 09/12/16 1758)  albuterol (PROVENTIL HFA;VENTOLIN HFA) 108 (90 Base) MCG/ACT inhaler 1-2 puff (2 puffs Inhalation Given 09/12/16 2122)  azithromycin (ZITHROMAX) tablet 500 mg (500 mg Oral Given 09/12/16 2122)     Initial Impression / Assessment and Plan / ED Course  I have reviewed the triage vital signs and the nursing notes.  Pertinent labs & imaging results that were available during my care of the patient were reviewed by me and considered in my medical decision making (see chart for details).     Patient is hemodynamically stable. Chest x-ray shows a possible right basilar infiltrate. Nebulizer treatment given. Antibiotics and steroids started in the ED along with Tussionex. Discharge medications Zithromax, prednisone, Hycodan.  Final Clinical Impressions(s) / ED Diagnoses   Final diagnoses:  Community acquired pneumonia of right lower lobe of lung (HCC)    New Prescriptions New Prescriptions   AZITHROMYCIN (ZITHROMAX) 250 MG TABLET    Take one daily starting Saturday evening   HYDROCODONE-HOMATROPINE (HYCODAN) 5-1.5  MG/5ML SYRUP    Take 5 mLs by mouth every 6 (six) hours as needed for cough.   PREDNISONE (DELTASONE) 10 MG TABLET    Take 2 tablets (20 mg total) by mouth daily.     Donnetta Hutching, MD 09/12/16 [phone]

## 2016-09-13 MED ORDER — BENZONATATE 100 MG PO CAPS: 100.0000 mg | ORAL_CAPSULE | Freq: Three times a day (TID) | ORAL | 0 refills | Status: DC

## 2016-09-13 NOTE — ED Provider Notes (Signed)
Pt returned with unsigned hycodan prescription. I will not fill controlled prescription without evaluation but Pt was given Rx for tessalon.   Lyndal Pulley, MD 09/13/16 [phone]

## 2016-10-23 ENCOUNTER — Encounter (HOSPITAL_COMMUNITY): Payer: Self-pay | Admitting: Emergency Medicine

## 2016-10-23 ENCOUNTER — Emergency Department (HOSPITAL_COMMUNITY)
Admission: EM | Admit: 2016-10-23 | Discharge: 2016-10-24 | Disposition: A | Discharge status: Home / Self Care | Payer: BLUE CROSS/BLUE SHIELD | Attending: Emergency Medicine | Admitting: Emergency Medicine

## 2016-10-23 ENCOUNTER — Emergency Department (HOSPITAL_COMMUNITY): Payer: BLUE CROSS/BLUE SHIELD

## 2016-10-23 DIAGNOSIS — J45909 Unspecified asthma, uncomplicated: Secondary | ICD-10-CM | POA: Insufficient documentation

## 2016-10-23 DIAGNOSIS — Z9114 Patient's other noncompliance with medication regimen: Secondary | ICD-10-CM

## 2016-10-23 DIAGNOSIS — F17290 Nicotine dependence, other tobacco product, uncomplicated: Secondary | ICD-10-CM | POA: Insufficient documentation

## 2016-10-23 DIAGNOSIS — B3731 Acute candidiasis of vulva and vagina: Secondary | ICD-10-CM

## 2016-10-23 DIAGNOSIS — B373 Candidiasis of vulva and vagina: Secondary | ICD-10-CM | POA: Insufficient documentation

## 2016-10-23 DIAGNOSIS — E1165 Type 2 diabetes mellitus with hyperglycemia: Secondary | ICD-10-CM | POA: Insufficient documentation

## 2016-10-23 DIAGNOSIS — Z7984 Long term (current) use of oral hypoglycemic drugs: Secondary | ICD-10-CM | POA: Insufficient documentation

## 2016-10-23 DIAGNOSIS — J45901 Unspecified asthma with (acute) exacerbation: Secondary | ICD-10-CM | POA: Insufficient documentation

## 2016-10-23 DIAGNOSIS — R739 Hyperglycemia, unspecified: Secondary | ICD-10-CM

## 2016-10-23 LAB — CBG MONITORING, ED: Glucose-Capillary: 365 mg/dL — ABNORMAL HIGH (ref 65–99)

## 2016-10-23 NOTE — ED Triage Notes (Addendum)
Patient here with complaints of cough, SOB. Hx of asthma. Also reports vaginal itching, thinks it may be a yeast infection. Also reports that she is a diabetic and hasnt been taking any medication.

## 2016-10-23 NOTE — ED Provider Notes (Signed)
[facility]-EMERGENCY DEPT Provider Note   CSN: [id number] Arrival date & time: 10/23/16  1729 By signing my name below, I, Levon HedgerElizabeth Hall, attest that this documentation has been prepared under the direction and in the presence of non-physician practitioner, Dierdre ForthHannah Merica Prell, PA-C. Electronically Signed: Levon HedgerElizabeth Hall, Scribe. 10/23/2016. 12:22 AM.   History   Chief Complaint Chief Complaint  Patient presents with  . Shortness of Breath  . Cough  . Vaginal Itching   HPI Diana Joyce is a 26 y.o. female with a hx of asthma who presents to the Emergency Department complaining of frequent nonproductive cough with associated shortness of breath onset today. She reports using her rescue inhaler without relief.  No OTC treatments tried for these symptoms PTA.  Pt has no other acute complaints or associated symptoms at this time. She has never been intubated or hospitalized for her asthma in the past. She does not use a nebulizer at home. Per pt, she was previously taking 500 mg Metformin 2x daily, but is currently looking for a PCP and is not on Metformin at this time. She states that her blood sugar has been in the 300's. The patient is currently on no regular medications.   Pt also reports gradually worsening vaginal irritation onset yesterday. Pt is currently employed at [employer] and often works outside. She states that the pants on her work uniform are hot, not breathable, and uncomfortable. Pt reports associated vaginal spotting which she noticed after wiping today.  Per pt, she has taken AZO with no relief.She also reports some mild dysuria which began tonight while in the ED, but expresses that this began after taking the AZO. She denies any vaginal discharge or hematuria. Pt has no other acute complaints or associated symptoms at this time.   The history is provided by the patient and medical records. No language interpreter was used.   Past Medical History:  Diagnosis Date  . Asthma   .  Diabetes [facility](HCC)     Patient Active Problem List   Diagnosis Date Noted  . Diabetes [facility](HCC)     Past Surgical History:  Procedure Laterality Date  . FRACTURE SURGERY      OB History    No data available      Home Medications    Prior to Admission medications   Medication Sig Start Date End Date Taking? Authorizing Provider  albuterol (PROVENTIL HFA;VENTOLIN HFA) 108 (90 Base) MCG/ACT inhaler Inhale 1-2 puffs into the lungs every 6 (six) hours as needed for wheezing or shortness of breath.   Yes [provider]  ibuprofen (ADVIL,MOTRIN) 200 MG tablet Take 400 mg by mouth every 6 (six) hours as needed.   Yes [provider]  pseudoephedrine (SUDAFED) 30 MG tablet Take 30 mg by mouth every 4 (four) hours as needed for congestion.   Yes [provider]  fluconazole (DIFLUCAN) 150 MG tablet Take 1 tablet (150 mg total) by mouth once. In 72 hours 10/24/16 10/24/16  Elai Vanwyk, Dahlia ClientHannah, PA-C  metFORMIN (GLUCOPHAGE) 500 MG tablet Take 1 tablet (500 mg total) by mouth 2 (two) times daily with a meal. 10/24/16   Marice Angelino, Dahlia ClientHannah, PA-C  predniSONE (DELTASONE) 20 MG tablet Take 2 tablets (40 mg total) by mouth daily. 10/24/16   Anamarie Hunn, Dahlia ClientHannah, PA-C    Family History Family History  Problem Relation Age of Onset  . Kidney failure Mother   . Hypertension Mother   . Diabetes Mother   . Heart failure Mother   . Diabetes Father  Social History Social History  Substance Use Topics  . Smoking status: Current Some Day Smoker    Types: Cigars  . Smokeless tobacco: Never Used  . Alcohol use No    Allergies   Benadryl [diphenhydramine hcl]  Review of Systems Review of Systems  Respiratory: Positive for cough and shortness of breath.   Genitourinary: Positive for dysuria, vaginal bleeding and vaginal pain. Negative for hematuria and vaginal discharge.  Allergic/Immunologic: Positive for immunocompromised state.  All other systems reviewed and are  negative.  Physical Exam Updated Vital Signs BP (!) 164/99 (BP Location: Left Arm)   Pulse (!) 111   Temp 98.7 F (37.1 C) (Oral)   Resp 20   SpO2 100%   Physical Exam  Constitutional: She appears well-developed and well-nourished. No distress.  Awake, alert, nontoxic appearance  HENT:  Head: Normocephalic and atraumatic.  Mouth/Throat: Oropharynx is clear and moist. No oropharyngeal exudate.  Eyes: Conjunctivae are normal. No scleral icterus.  Neck: Normal range of motion. Neck supple.  Cardiovascular: Regular rhythm, normal heart sounds and intact distal pulses.  Tachycardia present.   No murmur heard. Pulses:      Radial pulses are 2+ on the right side, and 2+ on the left side.  Pulmonary/Chest: Effort normal. No respiratory distress. She has decreased breath sounds. She has no wheezes.  Equal chest expansion  Abdominal: Soft. Bowel sounds are normal. She exhibits no mass. There is no tenderness. There is no rebound and no guarding. Hernia confirmed negative in the right inguinal area and confirmed negative in the left inguinal area.  Genitourinary: Uterus normal. No labial fusion. There is no rash, tenderness or lesion on the right labia. There is no rash, tenderness or lesion on the left labia. Uterus is not deviated, not enlarged, not fixed and not tender. Cervix exhibits no motion tenderness, no discharge and no friability. Right adnexum displays no mass, no tenderness and no fullness. Left adnexum displays no mass, no tenderness and no fullness. No erythema, tenderness or bleeding in the vagina. No foreign body in the vagina. No signs of injury around the vagina. Vaginal discharge (Thick, white, nonodorous, clinging to the vaginal walls) found.  Musculoskeletal: Normal range of motion. She exhibits no edema.  Lymphadenopathy:       Right: No inguinal adenopathy present.       Left: No inguinal adenopathy present.  Neurological: She is alert.  Speech is clear and goal  oriented Moves extremities without ataxia  Skin: Skin is warm and dry. She is not diaphoretic. No erythema.  Psychiatric: She has a normal mood and affect.  Nursing note and vitals reviewed.  ED Treatments / Results  DIAGNOSTIC STUDIES:  Oxygen Saturation is 100% on RA, normal by my interpretation.    COORDINATION OF CARE:  12:00 AM Discussed treatment plan with pt at bedside and pt agreed to plan.   Labs (all labs ordered are listed, but only abnormal results are displayed) Labs Reviewed  WET PREP, GENITAL - Abnormal; Notable for the following:       Result Value   Yeast Wet Prep HPF POC PRESENT (*)    WBC, Wet Prep HPF POC MODERATE (*)    All other components within normal limits  CBC WITH DIFFERENTIAL/PLATELET - Abnormal; Notable for the following:    RBC 5.53 (*)    MCV 71.1 (*)    MCH 22.4 (*)    RDW 16.0 (*)    Platelets 421 (*)    All other components  within normal limits  COMPREHENSIVE METABOLIC PANEL - Abnormal; Notable for the following:    Sodium 134 (*)    Chloride 98 (*)    Glucose, Bld 300 (*)    Calcium 8.8 (*)    All other components within normal limits  URINALYSIS, ROUTINE W REFLEX MICROSCOPIC - Abnormal; Notable for the following:    Color, Urine AMBER (*)    Specific Gravity, Urine 1.044 (*)    Glucose, UA >=500 (*)    Ketones, ur 5 (*)    Protein, ur 30 (*)    Leukocytes, UA TRACE (*)    Squamous Epithelial / LPF 0-5 (*)    All other components within normal limits  CBG MONITORING, ED - Abnormal; Notable for the following:    Glucose-Capillary 365 (*)    All other components within normal limits  CBG MONITORING, ED - Abnormal; Notable for the following:    Glucose-Capillary 326 (*)    All other components within normal limits  RPR  HIV ANTIBODY (ROUTINE TESTING)  POC URINE PREG, ED  CBG MONITORING, ED  CBG MONITORING, ED  GC/CHLAMYDIA PROBE AMP ([place]) NOT AT [facility]    Radiology Dg Chest 2 View  Result Date: 10/23/2016 CLINICAL  DATA:  Cough. EXAM: CHEST  2 VIEW COMPARISON:  September 12, 2016 FINDINGS: The heart size and mediastinal contours are within normal limits. Both lungs are clear. The visualized skeletal structures are unremarkable. IMPRESSION: No active cardiopulmonary disease. Electronically Signed   By: Gerome Sam III M.D   On: 10/23/2016 18:10    Procedures Procedures (including critical care time)  Medications Ordered in ED Medications  albuterol (PROVENTIL HFA;VENTOLIN HFA) 108 (90 Base) MCG/ACT inhaler 2 puff (not administered)  AEROCHAMBER PLUS FLO-VU MEDIUM MISC 1 each (not administered)  albuterol (PROVENTIL) (2.5 MG/3ML) 0.083% nebulizer solution 5 mg (5 mg Nebulization Given 10/24/16 0014)  ipratropium (ATROVENT) nebulizer solution 0.5 mg (0.5 mg Nebulization Given 10/24/16 0014)  sodium chloride 0.9 % bolus 1,000 mL (1,000 mLs Intravenous New Bag/Given 10/24/16 0120)  sodium chloride 0.9 % bolus 1,000 mL (1,000 mLs Intravenous New Bag/Given 10/24/16 0230)  fluconazole (DIFLUCAN) tablet 150 mg (150 mg Oral Given 10/24/16 0256)  predniSONE (DELTASONE) tablet 60 mg (60 mg Oral Given 10/24/16 0256)     Initial Impression / Assessment and Plan / ED Course  I have reviewed the triage vital signs and the nursing notes.  Pertinent labs & imaging results that were available during my care of the patient were reviewed by me and considered in my medical decision making (see chart for details).     Pt with multiple complaints.  Pt with hx of asthma and asthma exacerbation tonight.   Patient ambulated in ED with O2 saturations maintained >90, no current signs of respiratory distress. Lung exam improved after nebulizer treatment with significant improvement in tidal volume and cessation of cough. Prednisone given in the ED and pt will be discharged with 5 day burst. Pt states they are breathing at baseline. Pt has been instructed to continue using prescribed medications and to speak with PCP about today's  exacerbation.   Pt also with yesst vaginitis.  I suspect this is secondary to her hyperglycemia and sitting is wet underwear.  Long discussion about both with patient.  No CMT to suggest PID.  No UTI.  Pt will be given diflucan and d/c with OB/GYN f/u.    No significant improvement in CBG, but pt has been eating in the room.  No  evidence of DKA.  Fluid bolus given, tachycardia resolved.  Pt will be restarted on her Metformin.  Discussed that prednisone for asthma exacerbation will elevate her CBG.  Discussed the importance of medication adherence and diet control, especially during prednisone usage.  Pt states understanding.      BP (!) 150/63 (BP Location: Right Wrist)   Pulse 93   Temp 98.7 F (37.1 C) (Oral)   Resp 18   SpO2 100%    Final Clinical Impressions(s) / ED Diagnoses   Final diagnoses:  Exacerbation of asthma, unspecified asthma severity, unspecified whether persistent  Yeast vaginitis  Hyperglycemia  Noncompliance with medications    New Prescriptions New Prescriptions   FLUCONAZOLE (DIFLUCAN) 150 MG TABLET    Take 1 tablet (150 mg total) by mouth once. In 72 hours   METFORMIN (GLUCOPHAGE) 500 MG TABLET    Take 1 tablet (500 mg total) by mouth 2 (two) times daily with a meal.   PREDNISONE (DELTASONE) 20 MG TABLET    Take 2 tablets (40 mg total) by mouth daily.    I personally performed the services described in this documentation, which was scribed in my presence. The recorded information has been reviewed and is accurate.    Leanna Hamid, Boyd Kerbs 10/24/16 4540    Raeford Razor, MD 11/01/16 [phone]

## 2016-10-24 LAB — CBC WITH DIFFERENTIAL/PLATELET
BASOS ABS: 0 10*3/uL (ref 0.0–0.1)
Basophils Relative: 0 %
Eosinophils Absolute: 0.1 10*3/uL (ref 0.0–0.7)
Eosinophils Relative: 1 %
HCT: 39.3 % (ref 36.0–46.0)
HEMOGLOBIN: 12.4 g/dL (ref 12.0–15.0)
LYMPHS PCT: 44 %
Lymphs Abs: 4 10*3/uL (ref 0.7–4.0)
MCH: 22.4 pg — ABNORMAL LOW (ref 26.0–34.0)
MCHC: 31.6 g/dL (ref 30.0–36.0)
MCV: 71.1 fL — AB (ref 78.0–100.0)
Monocytes Absolute: 0.5 10*3/uL (ref 0.1–1.0)
Monocytes Relative: 5 %
NEUTROS ABS: 4.4 10*3/uL (ref 1.7–7.7)
NEUTROS PCT: 50 %
Platelets: 421 10*3/uL — ABNORMAL HIGH (ref 150–400)
RBC: 5.53 MIL/uL — AB (ref 3.87–5.11)
RDW: 16 % — ABNORMAL HIGH (ref 11.5–15.5)
WBC: 8.9 10*3/uL (ref 4.0–10.5)

## 2016-10-24 LAB — URINALYSIS, ROUTINE W REFLEX MICROSCOPIC
BACTERIA UA: NONE SEEN
BILIRUBIN URINE: NEGATIVE
Glucose, UA: >=500 mg/dL — AB
HGB URINE DIPSTICK: NEGATIVE
Ketones, ur: 5 mg/dL — AB
NITRITE: NEGATIVE
PH: 5 (ref 5.0–8.0)
Protein, ur: 30 mg/dL — AB
SPECIFIC GRAVITY, URINE: 1.044 — AB (ref 1.005–1.030)

## 2016-10-24 LAB — POC URINE PREG, ED: PREG TEST UR: NEGATIVE

## 2016-10-24 LAB — COMPREHENSIVE METABOLIC PANEL
ALT: 25 U/L (ref 14–54)
AST: 24 U/L (ref 15–41)
Albumin: 3.6 g/dL (ref 3.5–5.0)
Alkaline Phosphatase: 114 U/L (ref 38–126)
Anion gap: 9 (ref 5–15)
BUN: 7 mg/dL (ref 6–20)
CO2: 27 mmol/L (ref 22–32)
CREATININE: 0.57 mg/dL (ref 0.44–1.00)
Calcium: 8.8 mg/dL — ABNORMAL LOW (ref 8.9–10.3)
Chloride: 98 mmol/L — ABNORMAL LOW (ref 101–111)
GFR calc Af Amer: >60 mL/min (ref >60)
Glucose, Bld: 300 mg/dL — ABNORMAL HIGH (ref 65–99)
Potassium: 3.6 mmol/L (ref 3.5–5.1)
Sodium: 134 mmol/L — ABNORMAL LOW (ref 135–145)
Total Bilirubin: 0.5 mg/dL (ref 0.3–1.2)
Total Protein: 7.9 g/dL (ref 6.5–8.1)

## 2016-10-24 LAB — WET PREP, GENITAL
Clue Cells Wet Prep HPF POC: NONE SEEN
SPERM: NONE SEEN
Trich, Wet Prep: NONE SEEN

## 2016-10-24 LAB — GC/CHLAMYDIA PROBE AMP (~~LOC~~) NOT AT ARMC
Chlamydia: NEGATIVE
Neisseria Gonorrhea: NEGATIVE

## 2016-10-24 LAB — CBG MONITORING, ED: GLUCOSE-CAPILLARY: 326 mg/dL — AB (ref 65–99)

## 2016-10-24 MED ORDER — FLUCONAZOLE 150 MG PO TABS: 150.0000 mg | ORAL_TABLET | Freq: Once | ORAL | 0 refills | Status: AC

## 2016-10-24 MED ORDER — ALBUTEROL SULFATE (2.5 MG/3ML) 0.083% IN NEBU
5.0000 mg | INHALATION_SOLUTION | Freq: Once | RESPIRATORY_TRACT | Status: AC
  Administered 2016-10-24: 5 mg via RESPIRATORY_TRACT
  Filled 2016-10-24: qty 6

## 2016-10-24 MED ORDER — PREDNISONE 20 MG PO TABS: 40.0000 mg | ORAL_TABLET | Freq: Every day | ORAL | 0 refills | Status: DC

## 2016-10-24 MED ORDER — METFORMIN HCL 500 MG PO TABS: 500.0000 mg | ORAL_TABLET | Freq: Two times a day (BID) | ORAL | 0 refills | Status: DC

## 2016-10-24 MED ORDER — PREDNISONE 20 MG PO TABS
60.0000 mg | ORAL_TABLET | Freq: Once | ORAL | Status: AC
  Administered 2016-10-24: 60 mg via ORAL
  Filled 2016-10-24: qty 3

## 2016-10-24 MED ORDER — FLUCONAZOLE 150 MG PO TABS
150.0000 mg | ORAL_TABLET | Freq: Once | ORAL | Status: AC
  Administered 2016-10-24: 150 mg via ORAL
  Filled 2016-10-24: qty 1

## 2016-10-24 MED ORDER — ALBUTEROL SULFATE HFA 108 (90 BASE) MCG/ACT IN AERS
2.0000 | INHALATION_SPRAY | RESPIRATORY_TRACT | Status: DC | PRN
  Administered 2016-10-24: 2 via RESPIRATORY_TRACT
  Filled 2016-10-24: qty 6.7

## 2016-10-24 MED ORDER — SODIUM CHLORIDE 0.9 % IV BOLUS (SEPSIS)
1000.0000 mL | Freq: Once | INTRAVENOUS | Status: AC
  Administered 2016-10-24: 1000 mL via INTRAVENOUS

## 2016-10-24 MED ORDER — IPRATROPIUM BROMIDE 0.02 % IN SOLN
0.5000 mg | Freq: Once | RESPIRATORY_TRACT | Status: AC
  Administered 2016-10-24: 0.5 mg via RESPIRATORY_TRACT
  Filled 2016-10-24: qty 2.5

## 2016-10-24 MED ORDER — AEROCHAMBER PLUS FLO-VU MEDIUM MISC
1.0000 | Freq: Once | Status: AC
  Administered 2016-10-24: 1
  Filled 2016-10-24: qty 1

## 2016-10-24 NOTE — Discharge Instructions (Signed)
1. Medications: albuterol, prednisone, usual home medications °2. Treatment: rest, drink plenty of fluids, begin OTC antihistamine (Zyrtec or Claritin)  °3. Follow Up: Please followup with your primary doctor in 2-3 days for discussion of your diagnoses and further evaluation after today's visit; if you do not have a primary care doctor use the resource guide provided to find one; Please return to the ER for difficulty breathing, high fevers or worsening symptoms. ° °

## 2016-10-25 LAB — RPR: RPR Ser Ql: NONREACTIVE

## 2016-10-25 LAB — HIV ANTIBODY (ROUTINE TESTING W REFLEX): HIV Screen 4th Generation wRfx: NONREACTIVE

## 2016-10-29 ENCOUNTER — Emergency Department (HOSPITAL_COMMUNITY)
Admission: EM | Admit: 2016-10-29 | Discharge: 2016-10-30 | Disposition: A | Discharge status: Home / Self Care | Payer: BLUE CROSS/BLUE SHIELD | Attending: Emergency Medicine | Admitting: Emergency Medicine

## 2016-10-29 ENCOUNTER — Encounter (HOSPITAL_COMMUNITY): Payer: Self-pay

## 2016-10-29 DIAGNOSIS — Z7984 Long term (current) use of oral hypoglycemic drugs: Secondary | ICD-10-CM | POA: Insufficient documentation

## 2016-10-29 DIAGNOSIS — E1165 Type 2 diabetes mellitus with hyperglycemia: Secondary | ICD-10-CM | POA: Insufficient documentation

## 2016-10-29 DIAGNOSIS — F17290 Nicotine dependence, other tobacco product, uncomplicated: Secondary | ICD-10-CM | POA: Insufficient documentation

## 2016-10-29 DIAGNOSIS — J45909 Unspecified asthma, uncomplicated: Secondary | ICD-10-CM | POA: Insufficient documentation

## 2016-10-29 DIAGNOSIS — R739 Hyperglycemia, unspecified: Secondary | ICD-10-CM

## 2016-10-29 LAB — CBG MONITORING, ED: Glucose-Capillary: 443 mg/dL — ABNORMAL HIGH (ref 65–99)

## 2016-10-29 NOTE — ED Provider Notes (Signed)
[facility]-EMERGENCY DEPT Provider Note   CSN: [id number] Arrival date & time: 10/29/16  2132  By signing my name below, I, Orpah CobbMaurice Copeland, attest that this documentation has been prepared under the direction and in the presence of Twylla Arceneaux, PA-C. Electronically Signed: Orpah CobbMaurice Copeland , ED Scribe. 10/30/16. 6:19 AM.   History   Chief Complaint Chief Complaint  Patient presents with  . Headache  . Hypertension    HPI Diana Joyce is a 26 y.o. female with hx of diabetes mellitus who presents to the Emergency Department complaining of headache with onset x7 hours. She reportedly took her BP prior to coming into the ED and her bottom number was above 100. She describes the pain as an aching pressure that she rates 8/10 currently, 9/10 at worst. The location of the headache is L sided frontal that is non-radiating.  Pt reports photophobia, nausea, and x2 episodes of emesis today; increased thirst over the last few months. She denies any modifying factors.  Pt states that she has had similar headaches in the past.  Pt was diagnosed with diabetes x1 month ago and was prescribed Metformin 500mg  twice daily. Patient states she had to wait until she was recently paid to fill this medication. She has not taken any doses of it yet. Pt denies fever, chest pain, SOB, urinary frequency, neuro deficits, or any other complaints.  The history is provided by the patient. No language interpreter was used.    Past Medical History:  Diagnosis Date  . Asthma   . Diabetes [facility](HCC)     Patient Active Problem List   Diagnosis Date Noted  . Diabetes [facility](HCC)     Past Surgical History:  Procedure Laterality Date  . FRACTURE SURGERY      OB History    No data available       Home Medications    Prior to Admission medications   Medication Sig Start Date End Date Taking? Authorizing Provider  albuterol (PROVENTIL HFA;VENTOLIN HFA) 108 (90 Base) MCG/ACT inhaler Inhale 1-2 puffs into the lungs every 6  (six) hours as needed for wheezing or shortness of breath.   Yes [provider]  metFORMIN (GLUCOPHAGE) 500 MG tablet Take 1 tablet (500 mg total) by mouth 2 (two) times daily with a meal. 10/24/16   Muthersbaugh, Dahlia ClientHannah, PA-C  ondansetron (ZOFRAN ODT) 4 MG disintegrating tablet Take 1 tablet (4 mg total) by mouth every 8 (eight) hours as needed for nausea or vomiting. 10/30/16   Garyson Stelly C, PA-C  predniSONE (DELTASONE) 20 MG tablet Take 2 tablets (40 mg total) by mouth daily. 10/24/16   Muthersbaugh, Dahlia ClientHannah, PA-C  pseudoephedrine (SUDAFED) 30 MG tablet Take 30 mg by mouth every 4 (four) hours as needed for congestion.    [provider]    Family History Family History  Problem Relation Age of Onset  . Kidney failure Mother   . Hypertension Mother   . Diabetes Mother   . Heart failure Mother   . Diabetes Father     Social History Social History  Substance Use Topics  . Smoking status: Current Some Day Smoker    Types: Cigars  . Smokeless tobacco: Never Used  . Alcohol use No     Allergies   Benadryl [diphenhydramine hcl]   Review of Systems Review of Systems  Constitutional: Negative for chills and fever.  Eyes: Positive for photophobia.  Respiratory: Negative for shortness of breath.   Cardiovascular: Negative for chest pain.  Gastrointestinal: Positive for  nausea and vomiting. Negative for abdominal pain.  Genitourinary: Negative for dysuria, frequency and hematuria.  Musculoskeletal: Negative for neck pain and neck stiffness.  Skin: Negative for rash.  Neurological: Positive for headaches. Negative for dizziness and light-headedness.  All other systems reviewed and are negative.    Physical Exam Updated Vital Signs BP (!) 166/77 (BP Location: Right Arm)   Pulse (!) 109   Temp 97.8 F (36.6 C) (Oral)   Resp 18   LMP 09/26/2016   SpO2 100%   Physical Exam  Constitutional: She is oriented to person, place, and time. She appears  well-developed and well-nourished. No distress.  HENT:  Head: Normocephalic and atraumatic.  Mouth/Throat: Oropharynx is clear and moist.  Eyes: Conjunctivae and EOM are normal. Pupils are equal, round, and reactive to light.  Neck: Normal range of motion. Neck supple.  Cardiovascular: Normal rate, regular rhythm, normal heart sounds and intact distal pulses.   Pulmonary/Chest: Effort normal and breath sounds normal. No respiratory distress.  Abdominal: Soft. There is no tenderness. There is no guarding.  Musculoskeletal: She exhibits no edema.  Normal motor function intact in all extremities and spine. No midline spinal tenderness.   Lymphadenopathy:    She has no cervical adenopathy.  Neurological: She is alert and oriented to person, place, and time.  No sensory deficits. Strength 5/5 in all extremities. No gait disturbance. Coordination intact including heel to shin and finger to nose. Cranial nerves III-XII grossly intact. No facial droop.    Skin: Skin is warm and dry. She is not diaphoretic.  Psychiatric: She has a normal mood and affect. Her behavior is normal.  Nursing note and vitals reviewed.    ED Treatments / Results   DIAGNOSTIC STUDIES: Oxygen Saturation is 100% on RA, normal by my interpretation.   COORDINATION OF CARE: 6:19 AM-Discussed next steps with pt. Pt verbalized understanding and is agreeable with the plan.    Labs (all labs ordered are listed, but only abnormal results are displayed) Labs Reviewed  COMPREHENSIVE METABOLIC PANEL - Abnormal; Notable for the following:       Result Value   Sodium 132 (*)    Chloride 99 (*)    Glucose, Bld 411 (*)    All other components within normal limits  CBC WITH DIFFERENTIAL/PLATELET - Abnormal; Notable for the following:    RBC 5.33 (*)    MCV 71.3 (*)    MCH 22.5 (*)    RDW 16.2 (*)    All other components within normal limits  CBG MONITORING, ED - Abnormal; Notable for the following:    Glucose-Capillary  443 (*)    All other components within normal limits  CBG MONITORING, ED - Abnormal; Notable for the following:    Glucose-Capillary 317 (*)    All other components within normal limits  BLOOD GAS, VENOUS    EKG  EKG Interpretation None       Radiology No results found.  Procedures Procedures (including critical care time)  Medications Ordered in ED Medications  sodium chloride 0.9 % bolus 1,000 mL (0 mLs Intravenous Stopped 10/30/16 0133)    Followed by  sodium chloride 0.9 % bolus 1,000 mL (0 mLs Intravenous Stopped 10/30/16 0133)  ketorolac (TORADOL) 30 MG/ML injection 30 mg (30 mg Intravenous Given 10/30/16 0012)  metFORMIN (GLUCOPHAGE) tablet 500 mg (500 mg Oral Given 10/30/16 0136)     Initial Impression / Assessment and Plan / ED Course  I have reviewed the triage vital signs and  the nursing notes.  Pertinent labs & imaging results that were available during my care of the patient were reviewed by me and considered in my medical decision making (see chart for details).  Clinical Course as of Oct 30 617  Thu Oct 30, 2016  0129 Patient sleeping and resting comfortably on the bed. Easily arousable. Patient's headache has resolved. Patient now symptom-free. No additional complaints.  [SJ]    Clinical Course User Index [SJ] Pellegrino Kennard C, PA-C    Patient presents with headache and hyperglycemia. Suspect the 2 are related. Low suspicion for DKA or hypertensive emergency. No neuro or functional deficits. No red flag symptoms. Patient encouraged to comply with her medication regimen. Close PCP follow-up. Resources given. Case management consult placed. The patient was given instructions for home care as well as return precautions. Patient voices understanding of these instructions, accepts the plan, and is comfortable with discharge.    Vitals:   10/29/16 2141 10/29/16 2202 10/29/16 2343 10/30/16 0223  BP: (!) 136/95 (!) 166/77 (!) 160/77 (!) 145/98  Pulse: (!) 109 (!)  109 88 93  Resp: 18 18 18 16   Temp: 97.8 F (36.6 C)     TempSrc: Oral     SpO2: 97% 100% 96% 99%     Final Clinical Impressions(s) / ED Diagnoses   Final diagnoses:  Hyperglycemia    New Prescriptions Discharge Medication List as of 10/30/2016  2:25 AM    START taking these medications   Details  ondansetron (ZOFRAN ODT) 4 MG disintegrating tablet Take 1 tablet (4 mg total) by mouth every 8 (eight) hours as needed for nausea or vomiting., Starting Thu 10/30/2016, Print       I personally performed the services described in this documentation, which was scribed in my presence. The recorded information has been reviewed and is accurate.    Concepcion Living 10/30/16 1610    Azalia Bilis, MD 10/30/16 [phone]

## 2016-10-29 NOTE — ED Triage Notes (Signed)
Pt complains of a severe headache and hypertension Pt states she vomited when she got to the ED

## 2016-10-30 LAB — COMPREHENSIVE METABOLIC PANEL
ALBUMIN: 3.5 g/dL (ref 3.5–5.0)
ALT: 21 U/L (ref 14–54)
ANION GAP: 7 (ref 5–15)
AST: 23 U/L (ref 15–41)
Alkaline Phosphatase: 105 U/L (ref 38–126)
BILIRUBIN TOTAL: 0.5 mg/dL (ref 0.3–1.2)
BUN: 10 mg/dL (ref 6–20)
CALCIUM: 8.9 mg/dL (ref 8.9–10.3)
CO2: 26 mmol/L (ref 22–32)
Chloride: 99 mmol/L — ABNORMAL LOW (ref 101–111)
Creatinine, Ser: 0.68 mg/dL (ref 0.44–1.00)
GFR calc Af Amer: >60 mL/min (ref >60)
GLUCOSE: 411 mg/dL — AB (ref 65–99)
POTASSIUM: 4 mmol/L (ref 3.5–5.1)
Sodium: 132 mmol/L — ABNORMAL LOW (ref 135–145)
TOTAL PROTEIN: 7.6 g/dL (ref 6.5–8.1)

## 2016-10-30 LAB — CBC WITH DIFFERENTIAL/PLATELET
BASOS ABS: 0 10*3/uL (ref 0.0–0.1)
BASOS PCT: 0 %
EOS ABS: 0.1 10*3/uL (ref 0.0–0.7)
EOS PCT: 1 %
HCT: 38 % (ref 36.0–46.0)
Hemoglobin: 12 g/dL (ref 12.0–15.0)
Lymphocytes Relative: 38 %
Lymphs Abs: 3.9 10*3/uL (ref 0.7–4.0)
MCH: 22.5 pg — ABNORMAL LOW (ref 26.0–34.0)
MCHC: 31.6 g/dL (ref 30.0–36.0)
MCV: 71.3 fL — ABNORMAL LOW (ref 78.0–100.0)
MONO ABS: 0.5 10*3/uL (ref 0.1–1.0)
MONOS PCT: 5 %
NEUTROS ABS: 5.8 10*3/uL (ref 1.7–7.7)
Neutrophils Relative %: 56 %
PLATELETS: 371 10*3/uL (ref 150–400)
RBC: 5.33 MIL/uL — ABNORMAL HIGH (ref 3.87–5.11)
RDW: 16.2 % — AB (ref 11.5–15.5)
WBC: 10.3 10*3/uL (ref 4.0–10.5)

## 2016-10-30 LAB — BLOOD GAS, VENOUS
Acid-Base Excess: 1.2 mmol/L (ref 0.0–2.0)
Bicarbonate: 26.7 mmol/L (ref 20.0–28.0)
FIO2: 21
O2 SAT: 60.7 %
PATIENT TEMPERATURE: 97.8
PO2 VEN: 34.3 mmHg (ref 32.0–45.0)
pCO2, Ven: 47.3 mmHg (ref 44.0–60.0)
pH, Ven: 7.367 (ref 7.250–7.430)

## 2016-10-30 LAB — CBG MONITORING, ED: Glucose-Capillary: 317 mg/dL — ABNORMAL HIGH (ref 65–99)

## 2016-10-30 MED ORDER — SODIUM CHLORIDE 0.9 % IV BOLUS (SEPSIS)
1000.0000 mL | Freq: Once | INTRAVENOUS | Status: AC
  Administered 2016-10-30: 1000 mL via INTRAVENOUS

## 2016-10-30 MED ORDER — METOCLOPRAMIDE HCL 5 MG/ML IJ SOLN
10.0000 mg | Freq: Once | INTRAMUSCULAR | Status: DC
  Filled 2016-10-30: qty 2

## 2016-10-30 MED ORDER — METFORMIN HCL 500 MG PO TABS
500.0000 mg | ORAL_TABLET | Freq: Once | ORAL | Status: AC
  Administered 2016-10-30: 500 mg via ORAL
  Filled 2016-10-30: qty 1

## 2016-10-30 MED ORDER — KETOROLAC TROMETHAMINE 30 MG/ML IJ SOLN
30.0000 mg | Freq: Once | INTRAMUSCULAR | Status: AC
  Administered 2016-10-30: 30 mg via INTRAVENOUS
  Filled 2016-10-30: qty 1

## 2016-10-30 MED ORDER — ONDANSETRON 4 MG PO TBDP: 4.0000 mg | ORAL_TABLET | Freq: Three times a day (TID) | ORAL | 0 refills | Status: DC | PRN

## 2016-10-30 NOTE — ED Notes (Signed)
Pt reports HA pain has resolved.

## 2016-10-30 NOTE — Discharge Instructions (Signed)
Please follow up with a primary care provider as soon as possible. You should receive a call from a case manager who can offer some assistance in this matter, but it is recommended that you [facility] for an appointment. Until then, please take the Metformin as prescribed. Stay well hydrated by drinking plenty of water with a goal of about 0.5 Liters an hour. Zofran as needed for nausea/vomiting. Return to the ED should symptoms worsen.

## 2016-11-19 ENCOUNTER — Encounter (HOSPITAL_COMMUNITY): Payer: Self-pay | Admitting: Emergency Medicine

## 2016-11-19 ENCOUNTER — Emergency Department (HOSPITAL_COMMUNITY)
Admission: EM | Admit: 2016-11-19 | Discharge: 2016-11-19 | Disposition: A | Discharge status: Home / Self Care | Payer: BLUE CROSS/BLUE SHIELD | Attending: Emergency Medicine | Admitting: Emergency Medicine

## 2016-11-19 DIAGNOSIS — Z7984 Long term (current) use of oral hypoglycemic drugs: Secondary | ICD-10-CM | POA: Insufficient documentation

## 2016-11-19 DIAGNOSIS — E1165 Type 2 diabetes mellitus with hyperglycemia: Secondary | ICD-10-CM | POA: Insufficient documentation

## 2016-11-19 DIAGNOSIS — B373 Candidiasis of vulva and vagina: Secondary | ICD-10-CM | POA: Insufficient documentation

## 2016-11-19 DIAGNOSIS — R42 Dizziness and giddiness: Secondary | ICD-10-CM

## 2016-11-19 DIAGNOSIS — B3731 Acute candidiasis of vulva and vagina: Secondary | ICD-10-CM

## 2016-11-19 DIAGNOSIS — Z79899 Other long term (current) drug therapy: Secondary | ICD-10-CM | POA: Insufficient documentation

## 2016-11-19 DIAGNOSIS — J45909 Unspecified asthma, uncomplicated: Secondary | ICD-10-CM | POA: Insufficient documentation

## 2016-11-19 DIAGNOSIS — R739 Hyperglycemia, unspecified: Secondary | ICD-10-CM

## 2016-11-19 DIAGNOSIS — F17290 Nicotine dependence, other tobacco product, uncomplicated: Secondary | ICD-10-CM | POA: Insufficient documentation

## 2016-11-19 LAB — BASIC METABOLIC PANEL
Anion gap: 8 (ref 5–15)
BUN: 8 mg/dL (ref 6–20)
CHLORIDE: 103 mmol/L (ref 101–111)
CO2: 26 mmol/L (ref 22–32)
CREATININE: 0.57 mg/dL (ref 0.44–1.00)
Calcium: 8.6 mg/dL — ABNORMAL LOW (ref 8.9–10.3)
GFR calc Af Amer: >60 mL/min (ref >60)
GFR calc non Af Amer: >60 mL/min (ref >60)
GLUCOSE: 346 mg/dL — AB (ref 65–99)
POTASSIUM: 3.8 mmol/L (ref 3.5–5.1)
SODIUM: 137 mmol/L (ref 135–145)

## 2016-11-19 LAB — WET PREP, GENITAL
CLUE CELLS WET PREP: NONE SEEN
Sperm: NONE SEEN
TRICH WET PREP: NONE SEEN
Yeast Wet Prep HPF POC: NONE SEEN

## 2016-11-19 LAB — CBC
HEMATOCRIT: 37.6 % (ref 36.0–46.0)
Hemoglobin: 12 g/dL (ref 12.0–15.0)
MCH: 23.3 pg — AB (ref 26.0–34.0)
MCHC: 31.9 g/dL (ref 30.0–36.0)
MCV: 73 fL — AB (ref 78.0–100.0)
PLATELETS: 367 10*3/uL (ref 150–400)
RBC: 5.15 MIL/uL — ABNORMAL HIGH (ref 3.87–5.11)
RDW: 16.4 % — AB (ref 11.5–15.5)
WBC: 10.4 10*3/uL (ref 4.0–10.5)

## 2016-11-19 LAB — URINALYSIS, ROUTINE W REFLEX MICROSCOPIC
Bilirubin Urine: NEGATIVE
Glucose, UA: >=500 mg/dL — AB
Hgb urine dipstick: NEGATIVE
KETONES UR: NEGATIVE mg/dL
Nitrite: NEGATIVE
PH: 6 (ref 5.0–8.0)
PROTEIN: NEGATIVE mg/dL
Specific Gravity, Urine: 1.038 — ABNORMAL HIGH (ref 1.005–1.030)

## 2016-11-19 LAB — CBG MONITORING, ED: GLUCOSE-CAPILLARY: 292 mg/dL — AB (ref 65–99)

## 2016-11-19 MED ORDER — ALBUTEROL SULFATE HFA 108 (90 BASE) MCG/ACT IN AERS
2.0000 | INHALATION_SPRAY | Freq: Once | RESPIRATORY_TRACT | Status: AC
  Administered 2016-11-19: 2 via RESPIRATORY_TRACT
  Filled 2016-11-19: qty 6.7

## 2016-11-19 MED ORDER — NYSTATIN 100000 UNIT/GM EX POWD: Freq: Three times a day (TID) | CUTANEOUS | 0 refills | Status: AC

## 2016-11-19 MED ORDER — HYDROXYZINE HCL 25 MG PO TABS: 25.0000 mg | ORAL_TABLET | Freq: Four times a day (QID) | ORAL | 0 refills | Status: DC | PRN

## 2016-11-19 MED ORDER — BLOOD GLUCOSE MONITOR KIT: PACK | 0 refills | Status: DC

## 2016-11-19 MED ORDER — METFORMIN HCL 500 MG PO TABS: 1000.0000 mg | ORAL_TABLET | Freq: Two times a day (BID) | ORAL | 0 refills | Status: DC

## 2016-11-19 MED ORDER — HYDROXYZINE HCL 25 MG PO TABS
50.0000 mg | ORAL_TABLET | Freq: Once | ORAL | Status: AC
  Administered 2016-11-19: 50 mg via ORAL
  Filled 2016-11-19: qty 2

## 2016-11-19 MED ORDER — SODIUM CHLORIDE 0.9 % IV BOLUS (SEPSIS)
1000.0000 mL | Freq: Once | INTRAVENOUS | Status: AC
  Administered 2016-11-19: 1000 mL via INTRAVENOUS

## 2016-11-19 MED ORDER — FLUCONAZOLE 150 MG PO TABS
150.0000 mg | ORAL_TABLET | Freq: Once | ORAL | Status: AC
Start: 2016-11-19 — End: 2016-11-19
  Administered 2016-11-19: 150 mg via ORAL
  Filled 2016-11-19: qty 1

## 2016-11-19 NOTE — ED Notes (Signed)
Pelvic cart ready. 

## 2016-11-19 NOTE — ED Triage Notes (Addendum)
Pt from home with complaints of headache that began yesterday. Pt had her apartment building condemned and she has not had access to her kit to check her sugar. Pt states on Monday she checked her sugar and her cbg was 400 and she did not get treatment because she had to go to work. Pt stated she also has complaints of recurrent vaginal itching and discharge secondary to an unresolved yeast infection. Pt states her cbg is normally in 300s.

## 2016-11-19 NOTE — ED Provider Notes (Signed)
[facility] DEPT Provider Note   CSN: [id number] Arrival date & time: 11/19/16  1618     History   Chief Complaint Chief Complaint  Patient presents with  . Dizziness  . Hyperglycemia  . Headache    HPI Diana Joyce is a 26 y.o. female.  HPI   26 yo F with PMHx asthma, DM here with multiple complaints. Pt's primary concern is hyperglycemia. Her housing compelx was recently condemned. She left her metformin and glucose monitor at home and has been unable to check her sugars at home. She checked it on her father's machine and it was in the 400s. She has had mild fatigue but no polyuria or polydispia. No fevers. She also left her metformin and inhaler and is concerned that she is out of these meds. She is o/w well. She does report, however, persistent vaginal itching and discharge x several weeks. She has a h/o recurrent yeast infections with similar sx. No dyspareunia and she is sexually active with women only. No fevers or pelvic pain. No dysuria or frequency.  Past Medical History:  Diagnosis Date  . Asthma   . Diabetes [facility])     Patient Active Problem List   Diagnosis Date Noted  . Diabetes [facility])     Past Surgical History:  Procedure Laterality Date  . FRACTURE SURGERY      OB History    No data available       Home Medications    Prior to Admission medications   Medication Sig Start Date End Date Taking? Authorizing Provider  acetaminophen (TYLENOL) 500 MG tablet Take 1,000-1,500 mg by mouth every 6 (six) hours as needed.   Yes [provider]  blood glucose meter kit and supplies KIT Dispense based on patient and insurance preference. Use up to four times daily as directed. (FOR ICD-9 250.00, 250.01). 11/19/16   Duffy Bruce, MD  hydrOXYzine (ATARAX/VISTARIL) 25 MG tablet Take 1-2 tablets (25-50 mg total) by mouth every 6 (six) hours as needed for itching. 11/19/16   Duffy Bruce, MD  metFORMIN (GLUCOPHAGE) 500 MG tablet Take 2 tablets (1,000 mg  total) by mouth 2 (two) times daily with a meal. 11/19/16   Duffy Bruce, MD  nystatin (MYCOSTATIN/NYSTOP) powder Apply topically 3 (three) times daily. Groin area 11/19/16 11/26/16  Duffy Bruce, MD    Family History Family History  Problem Relation Age of Onset  . Kidney failure Mother   . Hypertension Mother   . Diabetes Mother   . Heart failure Mother   . Diabetes Father     Social History Social History  Substance Use Topics  . Smoking status: Current Some Day Smoker    Types: Cigars  . Smokeless tobacco: Never Used  . Alcohol use No     Allergies   Benadryl [diphenhydramine hcl]   Review of Systems Review of Systems  Constitutional: Positive for fatigue. Negative for chills and fever.  HENT: Negative for congestion, rhinorrhea and sore throat.   Eyes: Negative for visual disturbance.  Respiratory: Negative for cough, shortness of breath and wheezing.   Cardiovascular: Negative for chest pain and leg swelling.  Gastrointestinal: Negative for abdominal pain, diarrhea, nausea and vomiting.  Genitourinary: Positive for vaginal discharge (mild, white, with vaginal itching). Negative for dysuria, flank pain and vaginal bleeding.  Musculoskeletal: Negative for neck pain.  Skin: Negative for rash.  Allergic/Immunologic: Negative for immunocompromised state.  Neurological: Negative for syncope and headaches.  Hematological: Does not bruise/bleed easily.  All other systems  reviewed and are negative.    Physical Exam Updated Vital Signs BP 137/89   Pulse 86   Temp 97.8 F (36.6 C) (Oral)   Resp (!) 22   SpO2 100%   Physical Exam  Constitutional: She is oriented to person, place, and time. She appears well-developed and well-nourished. No distress.  HENT:  Head: Normocephalic and atraumatic.  Eyes: Conjunctivae are normal.  Neck: Neck supple.  Cardiovascular: Normal rate, regular rhythm and normal heart sounds.  Exam reveals no friction rub.   No murmur  heard. Pulmonary/Chest: Effort normal and breath sounds normal. No respiratory distress. She has no wheezes. She has no rales.  Abdominal: She exhibits no distension.  Genitourinary:  Genitourinary Comments: Mildly erythematous, well circumscribed lesions to inguinal creases bilaterally of perineum without induration or fluctuance. On vaginal exam, scant amount of thick white discharge. No CMT. No adnexal TTP.  Musculoskeletal: She exhibits no edema.  Neurological: She is alert and oriented to person, place, and time. She exhibits normal muscle tone.  Skin: Skin is warm. Capillary refill takes less than 2 seconds.  Psychiatric: She has a normal mood and affect.  Nursing note and vitals reviewed.    ED Treatments / Results  Labs (all labs ordered are listed, but only abnormal results are displayed) Labs Reviewed  WET PREP, GENITAL - Abnormal; Notable for the following:       Result Value   WBC, Wet Prep HPF POC FEW (*)    All other components within normal limits  BASIC METABOLIC PANEL - Abnormal; Notable for the following:    Glucose, Bld 346 (*)    Calcium 8.6 (*)    All other components within normal limits  CBC - Abnormal; Notable for the following:    RBC 5.15 (*)    MCV 73.0 (*)    MCH 23.3 (*)    RDW 16.4 (*)    All other components within normal limits  URINALYSIS, ROUTINE W REFLEX MICROSCOPIC - Abnormal; Notable for the following:    Specific Gravity, Urine 1.038 (*)    Glucose, UA >=500 (*)    Leukocytes, UA TRACE (*)    Bacteria, UA RARE (*)    Squamous Epithelial / LPF 0-5 (*)    All other components within normal limits  CBG MONITORING, ED - Abnormal; Notable for the following:    Glucose-Capillary 292 (*)    All other components within normal limits  CBG MONITORING, ED  GC/CHLAMYDIA PROBE AMP ([place]) NOT AT [facility]    EKG  EKG Interpretation  Date/Time:  Wednesday November 19 2016 20:09:58 EDT Ventricular Rate:  83 PR Interval:    QRS Duration: 83 QT  Interval:  366 QTC Calculation: 430 R Axis:   65 Text Interpretation:  Sinus rhythm Abnormal Q suggests anterior infarct No significant change since last tracing Confirmed by Duffy Bruce [phone] on 11/19/2016 10:28:50 PM Also confirmed by Duffy Bruce [phone]), editor Hattie Perch ([id number])  on 11/20/2016 7:35:36 AM       Radiology No results found.  Procedures Procedures (including critical care time)  Medications Ordered in ED Medications  albuterol (PROVENTIL HFA;VENTOLIN HFA) 108 (90 Base) MCG/ACT inhaler 2 puff (2 puffs Inhalation Given 11/19/16 2026)  sodium chloride 0.9 % bolus 1,000 mL (0 mLs Intravenous Stopped 11/19/16 2200)  hydrOXYzine (ATARAX/VISTARIL) tablet 50 mg (50 mg Oral Given 11/19/16 2343)  fluconazole (DIFLUCAN) tablet 150 mg (150 mg Oral Given 11/19/16 2343)     Initial Impression / Assessment and Plan /  ED Course  I have reviewed the triage vital signs and the nursing notes.  Pertinent labs & imaging results that were available during my care of the patient were reviewed by me and considered in my medical decision making (see chart for details).   26 yo F here with multiple complaints and request for refill of diabetic meds.  Hyperglycemia: Likely 2/2 med nonadherence. Normal CO2, normal AG, no ketones in urine - no signs of DKA. She was given 1L IVF with improvement in blood sugar. Discussed dietary changes, will increase metformin, prescribe blood gluse monitor, and advise PCP follow-up. No infectious triggers. Pt o/w well  Med refill: will prescribe albuterol and MD meds as above, until she is able to get them from her condemned apartment. No signs of resp distress. No other meds needed.  Vaginal itching: Exam c/w vaginal candidiasis, with intertrigo. No signs of bacterial superinfection. No evidence of PID - normal WBC, no CMT. Will tx with diflucan and topical cream. Atarax for itching.   Final Clinical Impressions(s) / ED Diagnoses   Final  diagnoses:  Dizziness  Hyperglycemia  Vaginal candidiasis    New Prescriptions Discharge Medication List as of 11/19/2016 11:19 PM    START taking these medications   Details  hydrOXYzine (ATARAX/VISTARIL) 25 MG tablet Take 1-2 tablets (25-50 mg total) by mouth every 6 (six) hours as needed for itching., Starting Wed 11/19/2016, Print    nystatin (MYCOSTATIN/NYSTOP) powder Apply topically 3 (three) times daily. Groin area, Starting Wed 11/19/2016, Until Wed 11/26/2016, Print         Duffy Bruce, MD 11/20/16 1137

## 2016-11-19 NOTE — ED Notes (Signed)
Patient is aware of the importance of obtaining a PCP given her medical conditions. She verbalized she [facility] but never called. Advised patient she needs to get in contact with them first thing tomorrow to establish some form of care to manage her diabetes. She verbalized understanding of instructions and medications.

## 2016-11-21 LAB — GC/CHLAMYDIA PROBE AMP (~~LOC~~) NOT AT ARMC
CHLAMYDIA, DNA PROBE: NEGATIVE
Neisseria Gonorrhea: NEGATIVE

## 2017-03-08 ENCOUNTER — Emergency Department (HOSPITAL_COMMUNITY)
Admission: EM | Admit: 2017-03-08 | Discharge: 2017-03-08 | Disposition: A | Discharge status: Home / Self Care | Payer: BLUE CROSS/BLUE SHIELD | Attending: Emergency Medicine | Admitting: Emergency Medicine

## 2017-03-08 ENCOUNTER — Encounter (HOSPITAL_COMMUNITY): Payer: Self-pay

## 2017-03-08 ENCOUNTER — Emergency Department (HOSPITAL_COMMUNITY): Payer: BLUE CROSS/BLUE SHIELD

## 2017-03-08 DIAGNOSIS — B9789 Other viral agents as the cause of diseases classified elsewhere: Secondary | ICD-10-CM | POA: Insufficient documentation

## 2017-03-08 DIAGNOSIS — J069 Acute upper respiratory infection, unspecified: Secondary | ICD-10-CM | POA: Insufficient documentation

## 2017-03-08 DIAGNOSIS — F17290 Nicotine dependence, other tobacco product, uncomplicated: Secondary | ICD-10-CM | POA: Insufficient documentation

## 2017-03-08 DIAGNOSIS — E1165 Type 2 diabetes mellitus with hyperglycemia: Secondary | ICD-10-CM | POA: Insufficient documentation

## 2017-03-08 LAB — COMPREHENSIVE METABOLIC PANEL
ALBUMIN: 3.1 g/dL — AB (ref 3.5–5.0)
ALK PHOS: 99 U/L (ref 38–126)
ALT: 17 U/L (ref 14–54)
ANION GAP: 8 (ref 5–15)
AST: 20 U/L (ref 15–41)
BILIRUBIN TOTAL: 0.4 mg/dL (ref 0.3–1.2)
BUN: 9 mg/dL (ref 6–20)
CALCIUM: 8.4 mg/dL — AB (ref 8.9–10.3)
CO2: 25 mmol/L (ref 22–32)
Chloride: 99 mmol/L — ABNORMAL LOW (ref 101–111)
Creatinine, Ser: 0.65 mg/dL (ref 0.44–1.00)
GFR calc non Af Amer: >60 mL/min (ref >60)
GLUCOSE: 421 mg/dL — AB (ref 65–99)
POTASSIUM: 4.1 mmol/L (ref 3.5–5.1)
Sodium: 132 mmol/L — ABNORMAL LOW (ref 135–145)
TOTAL PROTEIN: 6.9 g/dL (ref 6.5–8.1)

## 2017-03-08 LAB — CBC
HEMATOCRIT: 35.4 % — AB (ref 36.0–46.0)
HEMOGLOBIN: 11.2 g/dL — AB (ref 12.0–15.0)
MCH: 23.2 pg — ABNORMAL LOW (ref 26.0–34.0)
MCHC: 31.6 g/dL (ref 30.0–36.0)
MCV: 73.4 fL — ABNORMAL LOW (ref 78.0–100.0)
Platelets: 320 10*3/uL (ref 150–400)
RBC: 4.82 MIL/uL (ref 3.87–5.11)
RDW: 14.7 % (ref 11.5–15.5)
WBC: 9 10*3/uL (ref 4.0–10.5)

## 2017-03-08 LAB — RAPID STREP SCREEN (MED CTR MEBANE ONLY): STREPTOCOCCUS, GROUP A SCREEN (DIRECT): NEGATIVE

## 2017-03-08 LAB — URINALYSIS, ROUTINE W REFLEX MICROSCOPIC
BACTERIA UA: NONE SEEN
BILIRUBIN URINE: NEGATIVE
Glucose, UA: >=500 mg/dL — AB
HGB URINE DIPSTICK: NEGATIVE
KETONES UR: NEGATIVE mg/dL
LEUKOCYTES UA: NEGATIVE
NITRITE: NEGATIVE
Protein, ur: NEGATIVE mg/dL
RBC / HPF: NONE SEEN RBC/hpf (ref 0–5)
SPECIFIC GRAVITY, URINE: 1.032 — AB (ref 1.005–1.030)
pH: 6 (ref 5.0–8.0)

## 2017-03-08 LAB — LIPASE, BLOOD: Lipase: 25 U/L (ref 11–51)

## 2017-03-08 MED ORDER — OXYMETAZOLINE HCL 0.05 % NA SOLN
1.0000 | Freq: Once | NASAL | Status: AC
  Administered 2017-03-08: 1 via NASAL
  Filled 2017-03-08: qty 15

## 2017-03-08 MED ORDER — SODIUM CHLORIDE 0.9 % IV BOLUS (SEPSIS)
1000.0000 mL | Freq: Once | INTRAVENOUS | Status: AC
  Administered 2017-03-08: 1000 mL via INTRAVENOUS

## 2017-03-08 MED ORDER — IBUPROFEN 600 MG PO TABS: 600.0000 mg | ORAL_TABLET | Freq: Four times a day (QID) | ORAL | 0 refills | Status: DC | PRN

## 2017-03-08 MED ORDER — KETOROLAC TROMETHAMINE 30 MG/ML IJ SOLN
30.0000 mg | Freq: Once | INTRAMUSCULAR | Status: AC
  Administered 2017-03-08: 30 mg via INTRAVENOUS
  Filled 2017-03-08: qty 1

## 2017-03-08 MED ORDER — INSULIN ASPART 100 UNIT/ML ~~LOC~~ SOLN
10.0000 [IU] | Freq: Once | SUBCUTANEOUS | Status: AC
  Administered 2017-03-08: 10 [IU] via INTRAVENOUS
  Filled 2017-03-08: qty 1

## 2017-03-08 NOTE — ED Provider Notes (Signed)
[place] DEPT Provider Note   CSN: [id number] Arrival date & time: 03/08/17  1403     History   Chief Complaint Chief Complaint  Patient presents with  . Cough    HPI Diana Joyce is a 26 y.o. female.  Pt presents to the ED today with cough, sinus congestion, sore throat, and diarrhea.  Pt said sx started last night.  She tried to go to work, but was unable to make it through her day.  She came straight here.  She did not take her metformin today.  She did not take any tylenol/ibuprofen today.       Past Medical History:  Diagnosis Date  . Asthma   . Diabetes [facility])     Patient Active Problem List   Diagnosis Date Noted  . Diabetes [facility])     Past Surgical History:  Procedure Laterality Date  . FRACTURE SURGERY      OB History    No data available       Home Medications    Prior to Admission medications   Medication Sig Start Date End Date Taking? Authorizing Provider  blood glucose meter kit and supplies KIT Dispense based on patient and insurance preference. Use up to four times daily as directed. (FOR ICD-9 250.00, 250.01). 11/19/16  Yes Duffy Bruce, MD  metFORMIN (GLUCOPHAGE) 500 MG tablet Take 2 tablets (1,000 mg total) by mouth 2 (two) times daily with a meal. 11/19/16  Yes Duffy Bruce, MD  Pheniramine-PE-APAP [facility] COLD & SORE THROAT) 20-10-325 MG PACK Take 1 packet by mouth daily as needed (cold symptoms).   Yes [provider]  hydrOXYzine (ATARAX/VISTARIL) 25 MG tablet Take 1-2 tablets (25-50 mg total) by mouth every 6 (six) hours as needed for itching. Patient not taking: Reported on 03/08/2017 11/19/16   Duffy Bruce, MD  ibuprofen (ADVIL,MOTRIN) 600 MG tablet Take 1 tablet (600 mg total) by mouth every 6 (six) hours as needed. 03/08/17   Isla Pence, MD    Family History Family History  Problem Relation Age of Onset  . Kidney failure Mother   . Hypertension Mother   . Diabetes Mother   . Heart failure Mother   .  Diabetes Father     Social History Social History  Substance Use Topics  . Smoking status: Current Some Day Smoker    Types: Cigars  . Smokeless tobacco: Never Used  . Alcohol use No     Allergies   Benadryl [diphenhydramine hcl]   Review of Systems Review of Systems  Constitutional: Positive for fatigue.  HENT: Positive for sinus pain, sinus pressure and sore throat.   Respiratory: Positive for cough.   Gastrointestinal: Positive for diarrhea.  All other systems reviewed and are negative.    Physical Exam Updated Vital Signs BP 125/72 (BP Location: Left Arm)   Pulse 82   Temp 98.2 F (36.8 C) (Oral)   Resp 16   LMP 01/10/2017   SpO2 96%   Physical Exam  Constitutional: She is oriented to person, place, and time. She appears well-developed and well-nourished.  HENT:  Head: Normocephalic and atraumatic.  Right Ear: External ear normal.  Left Ear: External ear normal.  Nose: Rhinorrhea present.  Mouth/Throat: Posterior oropharyngeal erythema present.  Eyes: Pupils are equal, round, and reactive to light. Conjunctivae and EOM are normal.  Neck: Normal range of motion. Neck supple.  Cardiovascular: Normal rate, regular rhythm, normal heart sounds and intact distal pulses.   Pulmonary/Chest: Effort normal and breath sounds  normal.  Abdominal: Soft. Bowel sounds are normal.  Musculoskeletal: Normal range of motion.  Neurological: She is alert and oriented to person, place, and time.  Skin: Skin is warm and dry.  Psychiatric: She has a normal mood and affect. Her behavior is normal. Judgment and thought content normal.  Nursing note and vitals reviewed.    ED Treatments / Results  Labs (all labs ordered are listed, but only abnormal results are displayed) Labs Reviewed  COMPREHENSIVE METABOLIC PANEL - Abnormal; Notable for the following:       Result Value   Sodium 132 (*)    Chloride 99 (*)    Glucose, Bld 421 (*)    Calcium 8.4 (*)    Albumin 3.1 (*)     All other components within normal limits  CBC - Abnormal; Notable for the following:    Hemoglobin 11.2 (*)    HCT 35.4 (*)    MCV 73.4 (*)    MCH 23.2 (*)    All other components within normal limits  URINALYSIS, ROUTINE W REFLEX MICROSCOPIC - Abnormal; Notable for the following:    Color, Urine STRAW (*)    Specific Gravity, Urine 1.032 (*)    Glucose, UA >=500 (*)    Squamous Epithelial / LPF 0-5 (*)    All other components within normal limits  RAPID STREP SCREEN (NOT AT [facility])  CULTURE, GROUP A STREP ([place])  LIPASE, BLOOD    EKG  EKG Interpretation None       Radiology Dg Chest 2 View  Result Date: 03/08/2017 CLINICAL DATA:  Chest pain, shortness of breath. EXAM: CHEST  2 VIEW COMPARISON:  Radiographs of Oct 23, 2016 FINDINGS: The heart size and mediastinal contours are within normal limits. Both lungs are clear. No pneumothorax or pleural effusion is noted. The visualized skeletal structures are unremarkable. IMPRESSION: No active cardiopulmonary disease. Electronically Signed   By: Marijo Conception, M.D.   On: 03/08/2017 18:05    Procedures Procedures (including critical care time)  Medications Ordered in ED Medications  sodium chloride 0.9 % bolus 1,000 mL (0 mLs Intravenous Stopped 03/08/17 2111)  ketorolac (TORADOL) 30 MG/ML injection 30 mg (30 mg Intravenous Given 03/08/17 1953)  insulin aspart (novoLOG) injection 10 Units (10 Units Intravenous Given 03/08/17 1955)  oxymetazoline (AFRIN) 0.05 % nasal spray 1 spray (1 spray Each Nare Given 03/08/17 2047)     Initial Impression / Assessment and Plan / ED Course  I have reviewed the triage vital signs and the nursing notes.  Pertinent labs & imaging results that were available during my care of the patient were reviewed by me and considered in my medical decision making (see chart for details).     Pt is feeling much better.  She is encouraged to take her diabetic medications.  She knows to return if worse.  She  is given a note for work.  Final Clinical Impressions(s) / ED Diagnoses   Final diagnoses:  Viral upper respiratory tract infection  Poorly controlled type 2 diabetes mellitus (HCC)    New Prescriptions New Prescriptions   IBUPROFEN (ADVIL,MOTRIN) 600 MG TABLET    Take 1 tablet (600 mg total) by mouth every 6 (six) hours as needed.     Isla Pence, MD 03/08/17 2113

## 2017-03-08 NOTE — ED Triage Notes (Signed)
Pt c/o cough, congestion, headcache,  yellow mucus, body aches ,weakness, chills, nausea, diarrhea x5 since last night. Denies shortness of breath/chest pain. Took theraflu last night, has not tried anything else today.

## 2017-03-08 NOTE — ED Notes (Signed)
Pt ambulatory and independent at discharge.  Verbalized understanding of discharge instructions 

## 2017-03-11 LAB — CULTURE, GROUP A STREP (THRC)

## 2017-04-16 ENCOUNTER — Emergency Department (HOSPITAL_COMMUNITY)
Admission: EM | Admit: 2017-04-16 | Discharge: 2017-04-16 | Disposition: A | Discharge status: Home / Self Care | Payer: BLUE CROSS/BLUE SHIELD | Attending: Emergency Medicine | Admitting: Emergency Medicine

## 2017-04-16 ENCOUNTER — Encounter (HOSPITAL_COMMUNITY): Payer: Self-pay

## 2017-04-16 DIAGNOSIS — Z7984 Long term (current) use of oral hypoglycemic drugs: Secondary | ICD-10-CM | POA: Insufficient documentation

## 2017-04-16 DIAGNOSIS — F17290 Nicotine dependence, other tobacco product, uncomplicated: Secondary | ICD-10-CM | POA: Insufficient documentation

## 2017-04-16 DIAGNOSIS — K0889 Other specified disorders of teeth and supporting structures: Secondary | ICD-10-CM | POA: Insufficient documentation

## 2017-04-16 DIAGNOSIS — J45909 Unspecified asthma, uncomplicated: Secondary | ICD-10-CM | POA: Insufficient documentation

## 2017-04-16 DIAGNOSIS — M25562 Pain in left knee: Secondary | ICD-10-CM | POA: Insufficient documentation

## 2017-04-16 DIAGNOSIS — G8929 Other chronic pain: Secondary | ICD-10-CM | POA: Insufficient documentation

## 2017-04-16 DIAGNOSIS — E119 Type 2 diabetes mellitus without complications: Secondary | ICD-10-CM | POA: Insufficient documentation

## 2017-04-16 MED ORDER — PENICILLIN V POTASSIUM 500 MG PO TABS
500.0000 mg | ORAL_TABLET | Freq: Once | ORAL | Status: AC
  Administered 2017-04-16: 500 mg via ORAL
  Filled 2017-04-16: qty 1

## 2017-04-16 MED ORDER — PENICILLIN V POTASSIUM 500 MG PO TABS: 500.0000 mg | ORAL_TABLET | Freq: Four times a day (QID) | ORAL | 0 refills | Status: AC

## 2017-04-16 MED ORDER — NAPROXEN 500 MG PO TABS: 500.0000 mg | ORAL_TABLET | Freq: Two times a day (BID) | ORAL | 0 refills | Status: DC

## 2017-04-16 MED ORDER — NAPROXEN 500 MG PO TABS
500.0000 mg | ORAL_TABLET | Freq: Once | ORAL | Status: AC
Start: 2017-04-16 — End: 2017-04-16
  Administered 2017-04-16: 500 mg via ORAL
  Filled 2017-04-16: qty 1

## 2017-04-16 NOTE — ED Triage Notes (Signed)
Pt complains of wisdom teeth pain on the upper and lower left side She also complains of left knee pain for several weeks, sometimes she says its swollen and sore to walk

## 2017-04-23 NOTE — ED Provider Notes (Signed)
[place] DEPT Provider Note   CSN: [id number] Arrival date & time: 04/16/17  7782    History   Chief Complaint Chief Complaint  Patient presents with  . Dental Pain  . Knee Pain    HPI Diana Joyce is a 26 y.o. female.  The history is provided by the patient. No language interpreter was used.  Dental Pain   This is a recurrent problem. Episode onset: today while at work. The problem occurs hourly. The problem has been gradually worsening. The pain is moderate. She has tried nothing for the symptoms. The treatment provided no relief.  Knee Pain   This is a chronic problem. The problem occurs every several days. The problem has been gradually worsening. The pain is present in the left knee. The quality of the pain is described as aching. The pain is moderate. Pertinent negatives include no numbness and full range of motion. The symptoms are aggravated by activity and standing. She has tried OTC pain medications for the symptoms. The treatment provided mild relief. There has been no history of extremity trauma.    Past Medical History:  Diagnosis Date  . Asthma   . Diabetes [facility])     Patient Active Problem List   Diagnosis Date Noted  . Diabetes [facility])     Past Surgical History:  Procedure Laterality Date  . FRACTURE SURGERY      OB History    No data available       Home Medications    Prior to Admission medications   Medication Sig Start Date End Date Taking? Authorizing Provider  blood glucose meter kit and supplies KIT Dispense based on patient and insurance preference. Use up to four times daily as directed. (FOR ICD-9 250.00, 250.01). 11/19/16   Duffy Bruce, MD  hydrOXYzine (ATARAX/VISTARIL) 25 MG tablet Take 1-2 tablets (25-50 mg total) by mouth every 6 (six) hours as needed for itching. Patient not taking: Reported on 03/08/2017 11/19/16   Duffy Bruce, MD  ibuprofen (ADVIL,MOTRIN) 600 MG tablet Take 1 tablet (600 mg  total) by mouth every 6 (six) hours as needed. 03/08/17   Isla Pence, MD  metFORMIN (GLUCOPHAGE) 500 MG tablet Take 2 tablets (1,000 mg total) by mouth 2 (two) times daily with a meal. 11/19/16   Duffy Bruce, MD  naproxen (NAPROSYN) 500 MG tablet Take 1 tablet (500 mg total) 2 (two) times daily by mouth. 04/16/17   Antonietta Breach, PA-C  penicillin v potassium (VEETID) 500 MG tablet Take 1 tablet (500 mg total) 4 (four) times daily for 7 days by mouth. 04/16/17 04/23/17  Antonietta Breach, PA-C  Pheniramine-PE-APAP (THERAFLU COLD & SORE THROAT) 20-10-325 MG PACK Take 1 packet by mouth daily as needed (cold symptoms).    [provider]    Family History Family History  Problem Relation Age of Onset  . Kidney failure Mother   . Hypertension Mother   . Diabetes Mother   . Heart failure Mother   . Diabetes Father     Social History Social History   Tobacco Use  . Smoking status: Current Some Day Smoker    Types: Cigars  . Smokeless tobacco: Never Used  Substance Use Topics  . Alcohol use: No  . Drug use: No     Allergies   Benadryl [diphenhydramine hcl]   Review of Systems Review of Systems  Neurological: Negative for numbness.   Ten systems reviewed and are negative for acute change, except as noted in the  HPI.    Physical Exam Updated Vital Signs BP 115/66 (BP Location: Right Arm)   Pulse 77   Temp 98 F (36.7 C) (Oral)   Resp 16   LMP 03/18/2017   SpO2 99%   Physical Exam  Constitutional: She is oriented to person, place, and time. She appears well-developed and well-nourished. No distress.  HENT:  Head: Normocephalic and atraumatic.  Mouth/Throat: Oropharynx is clear and moist.  TTP to wisdom teeth on left. No gingival fluctuance or drainage in the mouth.  No trismus or significant facial swelling.  Patient tolerating secretions without difficulty.  Eyes: Conjunctivae and EOM are normal. No scleral icterus.  Neck: Normal range of motion.  No nuchal  rigidity or meningismus   Cardiovascular: Normal rate, regular rhythm and intact distal pulses.  Pulmonary/Chest: Effort normal. No respiratory distress.  Respirations even and unlabored  Musculoskeletal: Normal range of motion.  Neurological: She is alert and oriented to person, place, and time. She exhibits normal muscle tone. Coordination normal.  GCS 15. Patient moving all extremities.  Skin: Skin is warm and dry. No rash noted. She is not diaphoretic. No erythema. No pallor.  Psychiatric: She has a normal mood and affect. Her behavior is normal.  Nursing note and vitals reviewed.    ED Treatments / Results  Labs (all labs ordered are listed, but only abnormal results are displayed) Labs Reviewed - No data to display  EKG  EKG Interpretation None       Radiology No results found.  Procedures Procedures (including critical care time)  Medications Ordered in ED Medications  penicillin v potassium (VEETID) tablet 500 mg (500 mg Oral Given 04/16/17 0603)  naproxen (NAPROSYN) tablet 500 mg (500 mg Oral Given 04/16/17 0603)     Initial Impression / Assessment and Plan / ED Course  I have reviewed the triage vital signs and the nursing notes.  Pertinent labs & imaging results that were available during my care of the patient were reviewed by me and considered in my medical decision making (see chart for details).     Patient with toothache. No gross abscess. Exam unconcerning for Ludwig's angina or spread of infection. Will treat with penicillin and pain medicine. Urged patient to follow-up with dentist.    Patient also with c/o chronic L knee pain. Patient neurovascularly intact on exam. No hx of recent trauma to suggest need for emergent imaging. Plan for supportive management including RICE and NSAIDs; orthopedic follow up as needed. Return precautions discussed and provided. Patient discharged in stable condition with no unaddressed concerns.   Final Clinical  Impressions(s) / ED Diagnoses   Final diagnoses:  Dentalgia  Chronic pain of left knee    ED Discharge Orders        Ordered    penicillin v potassium (VEETID) 500 MG tablet  4 times daily     04/16/17 0614    naproxen (NAPROSYN) 500 MG tablet  2 times daily     04/16/17 0614       Antonietta Breach, PA-C 04/23/17 0403    Palumbo, April, MD 04/23/17 0430

## 2017-04-27 ENCOUNTER — Other Ambulatory Visit: Payer: Self-pay

## 2017-04-27 ENCOUNTER — Emergency Department (HOSPITAL_COMMUNITY)
Admission: EM | Admit: 2017-04-27 | Discharge: 2017-04-27 | Disposition: A | Discharge status: Home / Self Care | Payer: Self-pay | Attending: Emergency Medicine | Admitting: Emergency Medicine

## 2017-04-27 ENCOUNTER — Encounter (HOSPITAL_COMMUNITY): Payer: Self-pay | Admitting: Emergency Medicine

## 2017-04-27 DIAGNOSIS — G4726 Circadian rhythm sleep disorder, shift work type: Secondary | ICD-10-CM | POA: Insufficient documentation

## 2017-04-27 DIAGNOSIS — F17210 Nicotine dependence, cigarettes, uncomplicated: Secondary | ICD-10-CM | POA: Insufficient documentation

## 2017-04-27 DIAGNOSIS — Z7984 Long term (current) use of oral hypoglycemic drugs: Secondary | ICD-10-CM | POA: Insufficient documentation

## 2017-04-27 DIAGNOSIS — R21 Rash and other nonspecific skin eruption: Secondary | ICD-10-CM | POA: Insufficient documentation

## 2017-04-27 DIAGNOSIS — J45909 Unspecified asthma, uncomplicated: Secondary | ICD-10-CM | POA: Insufficient documentation

## 2017-04-27 DIAGNOSIS — Z791 Long term (current) use of non-steroidal anti-inflammatories (NSAID): Secondary | ICD-10-CM | POA: Insufficient documentation

## 2017-04-27 DIAGNOSIS — E119 Type 2 diabetes mellitus without complications: Secondary | ICD-10-CM | POA: Insufficient documentation

## 2017-04-27 DIAGNOSIS — Z79899 Other long term (current) drug therapy: Secondary | ICD-10-CM | POA: Insufficient documentation

## 2017-04-27 DIAGNOSIS — K0889 Other specified disorders of teeth and supporting structures: Secondary | ICD-10-CM | POA: Insufficient documentation

## 2017-04-27 NOTE — ED Notes (Signed)
Called pt to re-assess vitals, pt did not answer.  

## 2017-04-27 NOTE — Discharge Instructions (Signed)
Please take Ibuprofen (Advil, motrin) and Tylenol (acetaminophen) to relieve your pain.  You may take up to 600 MG (3 pills) of normal strength ibuprofen every 8 hours as needed.  In between doses of ibuprofen you make take tylenol, up to 1,000 mg (two extra strength pills).  Do not take more than 3,000 mg tylenol in a 24 hour period.  Please check all medication labels as many medications such as pain and cold medications may contain tylenol.  Do not drink alcohol while taking these medications.  Do not take other NSAID'S while taking ibuprofen (such as aleve or naproxen).  Please take ibuprofen with food to decrease stomach upset.  As you report that you have been falling asleep while driving and is not safe for you to drive.  Please do not drive, operate heavy machinery, or perform any other tasks that may cause a danger to you or someone else if you were to fall asleep while driving.  Please follow good sleepy hygiene steps.

## 2017-04-27 NOTE — ED Provider Notes (Signed)
[facility] DEPARTMENT Provider Note   CSN: [id number] Arrival date & time: 04/27/17  0122     History   Chief Complaint Chief Complaint  Patient presents with  . Dental Pain  . sleeping issues    HPI Diana Joyce is a 26 y.o. female with a history of diabetes who presents today for pain from her wisdom teeth coming in on the left side and falling asleep frequently during the day.  She reports that her wisdom teeth have been causing her pain for the past few months and now that they are erupting it has been gradually worsening.    She also reports that over the past few months she has had increased difficulty sleeping.  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  She reports that she has almost fallen asleep while driving.  Her girlfriend reports that she stops breathing for a couple seconds in her sleep and that she snores.  She also reports that she has a rash in her right underarm.  She reports that she tried ax deodorant about 2 weeks ago and developed an itchy rash after that.  She reports it has been gradually getting better.  HPI  Past Medical History:  Diagnosis Date  . Asthma   . Diabetes [facility])     Patient Active Problem List   Diagnosis Date Noted  . Diabetes [facility])     Past Surgical History:  Procedure Laterality Date  . FRACTURE SURGERY      OB History    No data available       Home Medications    Prior to Admission medications   Medication Sig Start Date End Date Taking? Authorizing Provider  blood glucose meter kit and supplies KIT Dispense based on patient and insurance preference. Use up to four times daily as directed. (FOR ICD-9 250.00, 250.01). 11/19/16   Duffy Bruce, MD  hydrOXYzine (ATARAX/VISTARIL) 25 MG tablet Take 1-2 tablets (25-50 mg total) by mouth every 6 (six) hours as needed for itching. Patient  not taking: Reported on 03/08/2017 11/19/16   Duffy Bruce, MD  ibuprofen (ADVIL,MOTRIN) 600 MG tablet Take 1 tablet (600 mg total) by mouth every 6 (six) hours as needed. 03/08/17   Isla Pence, MD  metFORMIN (GLUCOPHAGE) 500 MG tablet Take 2 tablets (1,000 mg total) by mouth 2 (two) times daily with a meal. 11/19/16   Duffy Bruce, MD  naproxen (NAPROSYN) 500 MG tablet Take 1 tablet (500 mg total) 2 (two) times daily by mouth. 04/16/17   Antonietta Breach, PA-C  Pheniramine-PE-APAP [facility] COLD & SORE THROAT) 20-10-325 MG PACK Take 1 packet by mouth daily as needed (cold symptoms).    [provider]    Family History Family History  Problem Relation Age of Onset  . Kidney failure Mother   . Hypertension Mother   . Diabetes Mother   . Heart failure Mother   . Diabetes Father     Social History Social History   Tobacco Use  . Smoking status: Current Some Day Smoker    Types: Cigars  . Smokeless tobacco: Never Used  Substance Use Topics  . Alcohol use: No  . Drug use: No     Allergies   Benadryl [diphenhydramine hcl]   Review of Systems Review of Systems  Constitutional: Positive for fatigue. Negative for chills, fever and unexpected  weight change.  HENT: Positive for dental problem. Negative for facial swelling, sore throat and trouble swallowing.   Skin: Positive for rash.  Psychiatric/Behavioral:       Sleepy during the daytime     Physical Exam Updated Vital Signs BP 128/60 (BP Location: Right Arm)   Pulse 82   Temp 98.4 F (36.9 C) (Oral)   Resp 16   Ht 5' 5" (1.651 m)   Wt (!) 138.5 kg (305 lb 6 oz)   SpO2 100%   BMI 50.82 kg/m   Physical Exam  Constitutional: She is oriented to person, place, and time. She appears well-developed and well-nourished.  HENT:  Head: Normocephalic and atraumatic.  Mouth/Throat: Oropharynx is clear and moist.  Left upper and lower third molars are erupting.  There is no obvious abnormal erythema, edema.  Very  mild tenderness to palpation.  No obvious drainage or pus.  There are no obvious cavities to these teeth or obvious dental calculus or decay.  Overall dentition appears good.  Neck: Normal range of motion. Neck supple. No tracheal deviation present.  Pulmonary/Chest: No respiratory distress.  Lymphadenopathy:    She has no cervical adenopathy.  Neurological: She is alert and oriented to person, place, and time.  Skin: Skin is warm and dry. She is not diaphoretic.  There is a darkened colored irregularly-shaped macule to the right axilla.  There is no obvious scaling or desquamation.  There are no satellite lesions.  There is no abnormal excoriation.  Psychiatric: She has a normal mood and affect. Her behavior is normal.  Nursing note and vitals reviewed.    ED Treatments / Results  Labs (all labs ordered are listed, but only abnormal results are displayed) Labs Reviewed - No data to display  EKG  EKG Interpretation None       Radiology No results found.  Procedures Procedures (including critical care time)  Medications Ordered in ED Medications - No data to display   Initial Impression / Assessment and Plan / ED Course  I have reviewed the triage vital signs and the nursing notes.  Pertinent labs & imaging results that were available during my care of the patient were reviewed by me and considered in my medical decision making (see chart for details).    Diana Joyce presents today for evaluation of multiple complaints.  1.  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  Patient is morbidly obese which may be contributing to her very brief periods of apnea.  She was advised to establish care with a primary care provider for further evaluation.  She  reports daytime sleepiness she was strictly advised not to drive, operate heavy machinery, or perform any other tasks where if she were to fall asleep it would cause a danger or potential for harm to herself or anyone else.  Advised that she may use over-the-counter melatonin and see if that helps her sleep better at night.  Given handout on sleep hygiene.  2.  Dental pain: No obvious calculus or caries.  Suspect that pain is secondary to abruption as it has been present for multiple months.  She does not have any obvious infectious symptoms, however antibiotics were discussed with the patient who agreed that she does not think she has an infection.  Instructed on over-the-counter pain management and given follow-up with oral surgeon.  3.  Rash: Instructed to stop using the deodorant that she has associated with this rash.  As it is an intertriginous area she was instructed on keeping the area clean and dry.  Instructed to follow-up with PCP.  She was given return precautions for all of her various complaints and states her understanding.   Final Clinical Impressions(s) / ED Diagnoses   Final diagnoses:  Shifting sleep-work schedule, affecting sleep  Pain, dental  Rash and other nonspecific skin eruption    ED Discharge Orders    None       Ollen Gross 04/27/17 4008    Jola Schmidt, MD 04/27/17 1106

## 2017-04-27 NOTE — ED Triage Notes (Signed)
Reports wisdom teeth are coming in on left side causing pain.  Also reports that she is falling asleep frequently during the day and that significant other noticed that she stops breathing when sleeping.

## 2017-04-27 NOTE — ED Notes (Signed)
No answer lobby when called for VS

## 2017-05-06 NOTE — Progress Notes (Signed)
Patient ID: Diana Joyce, female   DOB: 04-Dec-1990, 26 y.o.   MRN: [id number]     Diana Joyce, is a 26 y.o. female  TMA:[id number]  YBW:[id number]  DOB - [birth year]  Subjective:  Chief Complaint and HPI: Diana Joyce is a 26 y.o. female here today to establish care and for a follow up visit After being seen in the ED 04/27/2017 for dental pain, rash,  and sleeping issues.   She also has untreated diabetes.  She can't tolerate metformin due to GI issues and doesn't want to take it.  She wants to go on insulin.  Her mom died from complications of diabetes.  Her dad also had DM.  SHe admits to poor diet.  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  She wants to get more aggressive with taking care of herself.    Diagnosed "prediabetes" in 2015.  Diabetes diagnosis was about 1 year ago.  She took metformin for about 1 month.  She does not have a glucometer.    A/P from ED: 1.  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  Patient is morbidly obese which may be contributing to her very brief periods of apnea.  She was advised to establish care with a primary care provider for further evaluation.  She reports daytime sleepiness she was strictly advised not to drive, operate heavy machinery, or perform any other tasks where if she were to fall asleep it would cause a danger or potential for harm to herself or anyone else.  Advised that she may use over-the-counter melatonin and see if that helps her sleep better at night.  Given handout on sleep hygiene.  2.  Dental pain: No obvious calculus or caries.  Suspect that pain is secondary to abruption as it has been present for multiple months.  She does not have any obvious infectious symptoms, however antibiotics were discussed with the patient who  agreed that she does not think she has an infection.  Instructed on over-the-counter pain management and given follow-up with oral surgeon.  3.  Rash: Instructed to stop using the deodorant that she has associated with this rash.  As it is an intertriginous area she was instructed on keeping the area clean and dry.  Instructed to follow-up with PCP.  ED/Hospital notes reviewed.   Social History: 3rd shift worker Family history:  A lot of DM and htn in family  ROS:   Constitutional:  No f/c, No night sweats, No unexplained weight loss. EENT:  No vision changes, No blurry vision, No hearing changes. No mouth, throat, or ear problems.  Respiratory: No cough, No SOB Cardiac: No CP, no palpitations GI:  No abd pain, No N/V/D. GU: No Urinary s/sx Musculoskeletal: No joint pain Neuro: No headache, no dizziness, no motor weakness.  Skin: No rash Endocrine:  + polydipsia. + polyuria.  Psych: Denies SI/HI  No problems updated.  ALLERGIES: Allergies  Allergen Reactions  . Benadryl [Diphenhydramine Hcl] Other (See Comments)    Hives, coughing, itching.     PAST MEDICAL HISTORY: Past Medical History:  Diagnosis Date  . Asthma   . Diabetes ([place])     MEDICATIONS AT HOME: Prior to Admission medications   Medication Sig Start Date End Date Taking? Authorizing Provider  blood glucose meter kit  and supplies KIT Dispense based on patient and insurance preference. Use up to four times daily as directed. (FOR ICD-9 250.00, 250.01). 11/19/16   Duffy Bruce, MD  Blood Glucose Monitoring Suppl (TRUE METRIX METER) w/Device KIT Check blood sugars fasting and at bedtime daily 05/07/17   Argentina Donovan, PA-C  glucose blood (TRUE METRIX BLOOD GLUCOSE TEST) test strip Use as instructed 05/07/17   Argentina Donovan, PA-C  hydrOXYzine (ATARAX/VISTARIL) 25 MG tablet Take 1-2 tablets (25-50 mg total) by mouth every 6 (six) hours as needed for itching. Patient not taking: Reported on 03/08/2017 11/19/16    Duffy Bruce, MD  ibuprofen (ADVIL,MOTRIN) 600 MG tablet Take 1 tablet (600 mg total) by mouth every 6 (six) hours as needed. 03/08/17   Isla Pence, MD  insulin glargine (LANTUS) 100 unit/mL SOPN Inject 0.2 mLs (20 Units total) into the skin at bedtime. 05/07/17   Argentina Donovan, PA-C  Insulin Pen Needle (PEN NEEDLES) 32G X 4 MM MISC 1 Dose by Does not apply route daily. 05/07/17   Argentina Donovan, PA-C  naproxen (NAPROSYN) 500 MG tablet Take 1 tablet (500 mg total) 2 (two) times daily by mouth. 04/16/17   Antonietta Breach, PA-C  Pheniramine-PE-APAP [facility] COLD & SORE THROAT) 20-10-325 MG PACK Take 1 packet by mouth daily as needed (cold symptoms).    [provider]  TRUEPLUS LANCETS 28G MISC Check blood sugars fasting and at bedtime 05/07/17   Argentina Donovan, PA-C     Objective:  EXAM:   Vitals:   05/07/17 1001  BP: (!) 143/84  Pulse: 94  Resp: 16  Temp: 98 F (36.7 C)  TempSrc: Oral  SpO2: 98%  Weight: (!) 306 lb 12.8 oz (139.2 kg)    General appearance : A&OX3. NAD. Non-toxic-appearing, obese HEENT: Atraumatic and Normocephalic.  PERRLA. EOM intact.   Neck: supple, no JVD. No cervical lymphadenopathy. No thyromegaly Chest/Lungs:  Breathing-non-labored, Good air entry bilaterally, breath sounds normal without rales, rhonchi, or wheezing  CVS: S1 S2 regular, no murmurs, gallops, rubs  Extremities: Bilateral Lower Ext shows no edema, both legs are warm to touch with = pulse throughout Neurology:  CN II-XII grossly intact, Non focal.   Psych:  TP linear. J/I about DM poor. Normal speech. Appropriate eye contact and affect.  Skin:  No Rash  Data Review Lab Results  Component Value Date   HGBA1C 12.5 05/07/2017     Assessment & Plan   1. Type 2 diabetes mellitus with hyperglycemia, without long-term current use of insulin (HCC) Uncontrolled-hasn't been on meds in a while - Glucose (CBG) - HgB A1c - insulin aspart (novoLOG) injection 20 Units -  Blood Glucose Monitoring Suppl (TRUE METRIX METER) w/Device KIT; Check blood sugars fasting and at bedtime daily  Dispense: 1 kit; Refill: 1 - glucose blood (TRUE METRIX BLOOD GLUCOSE TEST) test strip; Use as instructed  Dispense: 100 each; Refill: 12 - TRUEPLUS LANCETS 28G MISC; Check blood sugars fasting and at bedtime  Dispense: 100 each; Refill: 1 start- insulin glargine (LANTUS) 100 unit/mL SOPN; Inject 0.2 mLs (20 Units total) into the skin at bedtime.  Dispense: 15 mL; Refill: 11 - Insulin Pen Needle (PEN NEEDLES) 32G X 4 MM MISC; 1 Dose by Does not apply route daily.  Dispense: 100 each; Refill: 1 Check labs at follow-up  2. Class 2 severe obesity with serious comorbidity in adult, unspecified BMI, unspecified obesity type ([place]) Dietary changes, exercsise discussed at length  Apply for orange card  Patient have been counseled extensively about nutrition and exercise  Return in about 3 weeks (around 05/28/2017) for Theda Sers to titrate DM meds then assign PCP in 6 weeks.  The patient was given clear instructions to go to ER or return to medical center if symptoms don't improve, worsen or new problems develop. The patient verbalized understanding. The patient was told to call to get lab results if they haven't heard anything in the next week.     Freeman Caldron, PA-C [facility] and [facility] [place], [place]   05/07/2017, 10:48 AM

## 2017-05-07 ENCOUNTER — Ambulatory Visit: Payer: Self-pay | Attending: Internal Medicine | Admitting: Physician Assistant

## 2017-05-07 VITALS — BP 143/84 | HR 94 | Temp 98.0°F | Resp 16 | Wt 306.8 lb

## 2017-05-07 DIAGNOSIS — Z888 Allergy status to other drugs, medicaments and biological substances status: Secondary | ICD-10-CM | POA: Insufficient documentation

## 2017-05-07 DIAGNOSIS — Z791 Long term (current) use of non-steroidal anti-inflammatories (NSAID): Secondary | ICD-10-CM | POA: Insufficient documentation

## 2017-05-07 DIAGNOSIS — Z833 Family history of diabetes mellitus: Secondary | ICD-10-CM | POA: Insufficient documentation

## 2017-05-07 DIAGNOSIS — E1165 Type 2 diabetes mellitus with hyperglycemia: Secondary | ICD-10-CM

## 2017-05-07 DIAGNOSIS — R21 Rash and other nonspecific skin eruption: Secondary | ICD-10-CM | POA: Insufficient documentation

## 2017-05-07 DIAGNOSIS — Z79899 Other long term (current) drug therapy: Secondary | ICD-10-CM | POA: Insufficient documentation

## 2017-05-07 DIAGNOSIS — J45909 Unspecified asthma, uncomplicated: Secondary | ICD-10-CM | POA: Insufficient documentation

## 2017-05-07 LAB — POCT GLYCOSYLATED HEMOGLOBIN (HGB A1C): HEMOGLOBIN A1C: 12.5

## 2017-05-07 LAB — GLUCOSE, POCT (MANUAL RESULT ENTRY)
POC GLUCOSE: 395 mg/dL — AB (ref 70–99)
POC Glucose: 397 mg/dl — AB (ref 70–99)

## 2017-05-07 MED ORDER — TRUE METRIX METER W/DEVICE KIT: PACK | 1 refills | Status: DC

## 2017-05-07 MED ORDER — PEN NEEDLES 32G X 4 MM MISC: 1.0000 | Freq: Every day | 1 refills | Status: DC

## 2017-05-07 MED ORDER — GLUCOSE BLOOD VI STRP: ORAL_STRIP | 12 refills | Status: DC

## 2017-05-07 MED ORDER — INSULIN ASPART 100 UNIT/ML ~~LOC~~ SOLN
20.0000 [IU] | Freq: Once | SUBCUTANEOUS | Status: AC
  Administered 2017-05-07: 20 [IU] via SUBCUTANEOUS

## 2017-05-07 MED ORDER — TRUEPLUS LANCETS 28G MISC: 1 refills | Status: DC

## 2017-05-07 MED ORDER — INSULIN GLARGINE 100 UNIT/ML SOLOSTAR PEN: 20.0000 [IU] | PEN_INJECTOR | Freq: Every day | SUBCUTANEOUS | 99 refills | Status: DC

## 2017-05-07 MED ORDER — INSULIN GLARGINE 100 UNITS/ML SOLOSTAR PEN: 20.0000 [IU] | PEN_INJECTOR | Freq: Every day | SUBCUTANEOUS | 11 refills | Status: DC

## 2017-05-07 MED FILL — !TRUE METRIX BLOOD GLUCOSE: 1 days supply | Qty: 1 | Fill #0

## 2017-05-07 MED FILL — TRUE METRIX TEST STRIP: 30 days supply | Qty: 100 | Fill #0

## 2017-05-07 MED FILL — TRUEplus LANCETS 28G MISC: 30 days supply | Qty: 100 | Fill #0

## 2017-05-07 MED FILL — TRUEPLUS PEN NDL 32GX5/32": 32G X 4 MM | 30 days supply | Qty: 100 | Fill #0

## 2017-05-07 MED FILL — !LANTUS SOLOSTAR 100UNITS/M: 100 | 30 days supply | Qty: 6 | Fill #0

## 2017-05-07 MED FILL — TRUEPLUS PEN NDL 32GX5/32: 32G X 4 MM | 30 days supply | Qty: 100 | Fill #0

## 2017-05-07 NOTE — Patient Instructions (Addendum)
Check blood sugars fasting and at bedtime and record  Diabetes Mellitus and Food It is important for you to manage your blood sugar (glucose) level. Your blood glucose level can be greatly affected by what you eat. Eating healthier foods in the appropriate amounts throughout the day at about the same time each day will help you control your blood glucose level. It can also help slow or prevent worsening of your diabetes mellitus. Healthy eating may even help you improve the level of your blood pressure and reach or maintain a healthy weight. General recommendations for healthful eating and cooking habits include:  Eating meals and snacks regularly. Avoid going long periods of time without eating to lose weight.  Eating a diet that consists mainly of plant-based foods, such as fruits, vegetables, nuts, legumes, and whole grains.  Using low-heat cooking methods, such as baking, instead of high-heat cooking methods, such as deep frying.  Work with your dietitian to make sure you understand how to use the Nutrition Facts information on food labels. How can food affect me? Carbohydrates Carbohydrates affect your blood glucose level more than any other type of food. Your dietitian will help you determine how many carbohydrates to eat at each meal and teach you how to count carbohydrates. Counting carbohydrates is important to keep your blood glucose at a healthy level, especially if you are using insulin or taking certain medicines for diabetes mellitus. Alcohol Alcohol can cause sudden decreases in blood glucose (hypoglycemia), especially if you use insulin or take certain medicines for diabetes mellitus. Hypoglycemia can be a life-threatening condition. Symptoms of hypoglycemia (sleepiness, dizziness, and disorientation) are similar to symptoms of having too much alcohol. If your health care provider has given you approval to drink alcohol, do so in moderation and use the following guidelines:  Women  should not have more than one drink per day, and men should not have more than two drinks per day. One drink is equal to: ? 12 oz of beer. ? 5 oz of wine. ? 1 oz of hard liquor.  Do not drink on an empty stomach.  Keep yourself hydrated. Have water, diet soda, or unsweetened iced tea.  Regular soda, juice, and other mixers might contain a lot of carbohydrates and should be counted.  What foods are not recommended? As you make food choices, it is important to remember that all foods are not the same. Some foods have fewer nutrients per serving than other foods, even though they might have the same number of calories or carbohydrates. It is difficult to get your body what it needs when you eat foods with fewer nutrients. Examples of foods that you should avoid that are high in calories and carbohydrates but low in nutrients include:  Trans fats (most processed foods list trans fats on the Nutrition Facts label).  Regular soda.  Juice.  Candy.  Sweets, such as cake, pie, doughnuts, and cookies.  Fried foods.  What foods can I eat? Eat nutrient-rich foods, which will nourish your body and keep you healthy. The food you should eat also will depend on several factors, including:  The calories you need.  The medicines you take.  Your weight.  Your blood glucose level.  Your blood pressure level.  Your cholesterol level.  You should eat a variety of foods, including:  Protein. ? Lean cuts of meat. ? Proteins low in saturated fats, such as fish, egg whites, and beans. Avoid processed meats.  Fruits and vegetables. ? Fruits and vegetables  that may help control blood glucose levels, such as apples, mangoes, and yams.  Dairy products. ? Choose fat-free or low-fat dairy products, such as milk, yogurt, and cheese.  Grains, bread, pasta, and rice. ? Choose whole grain products, such as multigrain bread, whole oats, and brown rice. These foods may help control blood  pressure.  Fats. ? Foods containing healthful fats, such as nuts, avocado, olive oil, canola oil, and fish.  Does everyone with diabetes mellitus have the same meal plan? Because every person with diabetes mellitus is different, there is not one meal plan that works for everyone. It is very important that you meet with a dietitian who will help you create a meal plan that is just right for you. This information is not intended to replace advice given to you by your health care provider. Make sure you discuss any questions you have with your health care provider. Document Released: 02/20/2005 Document Revised: 11/01/2015 Document Reviewed: 04/22/2013 Elsevier Interactive Patient Education  2017 [employer]  Diabetes Mellitus and Exercise Exercising regularly is important for your overall health, especially when you have diabetes (diabetes mellitus). Exercising is not only about losing weight. It has many health benefits, such as increasing muscle strength and bone density and reducing body fat and stress. This leads to improved fitness, flexibility, and endurance, all of which result in better overall health. Exercise has additional benefits for people with diabetes, including:  Reducing appetite.  Helping to lower and control blood glucose.  Lowering blood pressure.  Helping to control amounts of fatty substances (lipids) in the blood, such as cholesterol and triglycerides.  Helping the body to respond better to insulin (improving insulin sensitivity).  Reducing how much insulin the body needs.  Decreasing the risk for heart disease by: ? Lowering cholesterol and triglyceride levels. ? Increasing the levels of good cholesterol. ? Lowering blood glucose levels.  What is my activity plan? Your health care provider or certified diabetes educator can help you make a plan for the type and frequency of exercise (activity plan) that works for you. Make sure that you:  Do at least 150  minutes of moderate-intensity or vigorous-intensity exercise each week. This could be brisk walking, biking, or water aerobics. ? Do stretching and strength exercises, such as yoga or weightlifting, at least 2 times a week. ? Spread out your activity over at least 3 days of the week.  Get some form of physical activity every day. ? Do not go more than 2 days in a row without some kind of physical activity. ? Avoid being inactive for more than 90 minutes at a time. Take frequent breaks to walk or stretch.  Choose a type of exercise or activity that you enjoy, and set realistic goals.  Start slowly, and gradually increase the intensity of your exercise over time.  What do I need to know about managing my diabetes?  Check your blood glucose before and after exercising. ? If your blood glucose is higher than 240 mg/dL (16.113.3 mmol/L) before you exercise, check your urine for ketones. If you have ketones in your urine, do not exercise until your blood glucose returns to normal.  Know the symptoms of low blood glucose (hypoglycemia) and how to treat it. Your risk for hypoglycemia increases during and after exercise. Common symptoms of hypoglycemia can include: ? Hunger. ? Anxiety. ? Sweating and feeling clammy. ? Confusion. ? Dizziness or feeling light-headed. ? Increased heart rate or palpitations. ? Blurry vision. ? Tingling or numbness  around the mouth, lips, or tongue. ? Tremors or shakes. ? Irritability.  Keep a rapid-acting carbohydrate snack available before, during, and after exercise to help prevent or treat hypoglycemia.  Avoid injecting insulin into areas of the body that are going to be exercised. For example, avoid injecting insulin into: ? The arms, when playing tennis. ? The legs, when jogging.  Keep records of your exercise habits. Doing this can help you and your health care provider adjust your diabetes management plan as needed. Write down: ? Food that you eat before  and after you exercise. ? Blood glucose levels before and after you exercise. ? The type and amount of exercise you have done. ? When your insulin is expected to peak, if you use insulin. Avoid exercising at times when your insulin is peaking.  When you start a new exercise or activity, work with your health care provider to make sure the activity is safe for you, and to adjust your insulin, medicines, or food intake as needed.  Drink plenty of water while you exercise to prevent dehydration or heat stroke. Drink enough fluid to keep your urine clear or pale yellow. This information is not intended to replace advice given to you by your health care provider. Make sure you discuss any questions you have with your health care provider. Document Released: 08/16/2003 Document Revised: 12/14/2015 Document Reviewed: 11/05/2015 Elsevier Interactive Patient Education  2018 [employer].

## 2017-05-28 ENCOUNTER — Encounter: Payer: Self-pay | Admitting: Pharmacist

## 2017-05-28 ENCOUNTER — Ambulatory Visit: Payer: Self-pay | Attending: Internal Medicine | Admitting: Pharmacist

## 2017-05-28 DIAGNOSIS — E1165 Type 2 diabetes mellitus with hyperglycemia: Secondary | ICD-10-CM | POA: Insufficient documentation

## 2017-05-28 MED ORDER — INSULIN GLARGINE 100 UNIT/ML SOLOSTAR PEN: 25.0000 [IU] | PEN_INJECTOR | Freq: Every day | SUBCUTANEOUS | 2 refills | Status: DC

## 2017-05-28 NOTE — Progress Notes (Signed)
    S:     Chief Complaint  Patient presents with  . Medication Management    Patient arrives in good spirits.  Presents for diabetes evaluation, education, and management.  Patient reports Diabetes was diagnosed about a year ago.   Patient reports adherence with medications.  Current diabetes medications include: Lantus 20 units daily. Was on metformin but it caused GI upset and she does not want to take it.   Patient denies hypoglycemic events.  Patient reported dietary habits: trying to eat better. Drank some orange juice today.  Planning on transitioning to female soon. Patient would like to start testosterone shots through [facility] and wanted to know if this would impact blood sugar.  Works third shift so it is hard for her to get fasting blood sugars because she only sleeps a few hours and eats snacks throughout the day. Sleeps at most 4 hours.   O:  Physical Exam   ROS   Lab Results  Component Value Date   HGBA1C 12.5 05/07/2017   There were no vitals filed for this visit.  Home fasting CBG: 190s-280s 2 hour post-prandial/random CBG: 200-290s   A/P: Diabetes longstanding currently uncontrolled based on A1c of 12.5. Patient denies hypoglycemic events and is able to verbalize appropriate hypoglycemia management plan. Patient reports adherence with medication. Control is suboptimal due to dietary indiscretion and sedentary lifestyle.  Increase Lantus to 25 units daily. Discussed how hormones and steroids can impact blood sugar but that we can adjust the insulin as needed. Congratulated patient on progress, the blood sugars have come down from the 400s to the 200s so good progress.  Next A1C anticipated March 2019.    Written patient instructions provided.  Total time in face to face counseling 15 minutes.   Follow up with new PCP in January as scheduled.

## 2017-05-28 NOTE — Patient Instructions (Signed)
Thanks for coming to see me!  You are definitely making progress - way to go!  Increase the insulin to 25 units every day.   Follow up with Diana Joyce in January   Diabetes Mellitus and Nutrition When you have diabetes (diabetes mellitus), it is very important to have healthy eating habits because your blood sugar (glucose) levels are greatly affected by what you eat and drink. Eating healthy foods in the appropriate amounts, at about the same times every day, can help you:  Control your blood glucose.  Lower your risk of heart disease.  Improve your blood pressure.  Reach or maintain a healthy weight.  Every person with diabetes is different, and each person has different needs for a meal plan. Your health care provider may recommend that you work with a diet and nutrition specialist (dietitian) to make a meal plan that is best for you. Your meal plan may vary depending on factors such as:  The calories you need.  The medicines you take.  Your weight.  Your blood glucose, blood pressure, and cholesterol levels.  Your activity level.  Other health conditions you have, such as heart or kidney disease.  How do carbohydrates affect me? Carbohydrates affect your blood glucose level more than any other type of food. Eating carbohydrates naturally increases the amount of glucose in your blood. Carbohydrate counting is a method for keeping track of how many carbohydrates you eat. Counting carbohydrates is important to keep your blood glucose at a healthy level, especially if you use insulin or take certain oral diabetes medicines. It is important to know how many carbohydrates you can safely have in each meal. This is different for every person. Your dietitian can help you calculate how many carbohydrates you should have at each meal and for snack. Foods that contain carbohydrates include:  Bread, cereal, rice, pasta, and crackers.  Potatoes and corn.  Peas, beans, and  lentils.  Milk and yogurt.  Fruit and juice.  Desserts, such as cakes, cookies, ice cream, and candy.  How does alcohol affect me? Alcohol can cause a sudden decrease in blood glucose (hypoglycemia), especially if you use insulin or take certain oral diabetes medicines. Hypoglycemia can be a life-threatening condition. Symptoms of hypoglycemia (sleepiness, dizziness, and confusion) are similar to symptoms of having too much alcohol. If your health care provider says that alcohol is safe for you, follow these guidelines:  Limit alcohol intake to no more than 1 drink per day for nonpregnant women and 2 drinks per day for men. One drink equals 12 oz of beer, 5 oz of wine, or 1 oz of hard liquor.  Do not drink on an empty stomach.  Keep yourself hydrated with water, diet soda, or unsweetened iced tea.  Keep in mind that regular soda, juice, and other mixers may contain a lot of sugar and must be counted as carbohydrates.  What are tips for following this plan? Reading food labels  Start by checking the serving size on the label. The amount of calories, carbohydrates, fats, and other nutrients listed on the label are based on one serving of the food. Many foods contain more than one serving per package.  Check the total grams (g) of carbohydrates in one serving. You can calculate the number of servings of carbohydrates in one serving by dividing the total carbohydrates by 15. For example, if a food has 30 g of total carbohydrates, it would be equal to 2 servings of carbohydrates.  Check the number  of grams (g) of saturated and trans fats in one serving. Choose foods that have low or no amount of these fats.  Check the number of milligrams (mg) of sodium in one serving. Most people should limit total sodium intake to less than 2,300 mg per day.  Always check the nutrition information of foods labeled as "low-fat" or "nonfat". These foods may be higher in added sugar or refined carbohydrates  and should be avoided.  Talk to your dietitian to identify your daily goals for nutrients listed on the label. Shopping  Avoid buying canned, premade, or processed foods. These foods tend to be high in fat, sodium, and added sugar.  Shop around the outside edge of the grocery store. This includes fresh fruits and vegetables, bulk grains, fresh meats, and fresh dairy. Cooking  Use low-heat cooking methods, such as baking, instead of high-heat cooking methods like deep frying.  Cook using healthy oils, such as olive, canola, or sunflower oil.  Avoid cooking with butter, cream, or high-fat meats. Meal planning  Eat meals and snacks regularly, preferably at the same times every day. Avoid going long periods of time without eating.  Eat foods high in fiber, such as fresh fruits, vegetables, beans, and whole grains. Talk to your dietitian about how many servings of carbohydrates you can eat at each meal.  Eat 4-6 ounces of lean protein each day, such as lean meat, chicken, fish, eggs, or tofu. 1 ounce is equal to 1 ounce of meat, chicken, or fish, 1 egg, or 1/4 cup of tofu.  Eat some foods each day that contain healthy fats, such as avocado, nuts, seeds, and fish. Lifestyle   Check your blood glucose regularly.  Exercise at least 30 minutes 5 or more days each week, or as told by your health care provider.  Take medicines as told by your health care provider.  Do not use any products that contain nicotine or tobacco, such as cigarettes and e-cigarettes. If you need help quitting, ask your health care provider.  Work with a Veterinary surgeon or diabetes educator to identify strategies to manage stress and any emotional and social challenges. What are some questions to ask my health care provider?  Do I need to meet with a diabetes educator?  Do I need to meet with a dietitian?  What number can I call if I have questions?  When are the best times to check my blood glucose? Where to find  more information:  American Diabetes Association: diabetes.org/food-and-fitness/food  Academy of Nutrition and Dietetics: https://www.vargas.com/  [employer] of Diabetes and Digestive and Kidney Diseases (NIH): FindJewelers.cz Summary  A healthy meal plan will help you control your blood glucose and maintain a healthy lifestyle.  Working with a diet and nutrition specialist (dietitian) can help you make a meal plan that is best for you.  Keep in mind that carbohydrates and alcohol have immediate effects on your blood glucose levels. It is important to count carbohydrates and to use alcohol carefully. This information is not intended to replace advice given to you by your health care provider. Make sure you discuss any questions you have with your health care provider. Document Released: 02/20/2005 Document Revised: 06/30/2016 Document Reviewed: 06/30/2016 Elsevier Interactive Patient Education  2018 [employer].   Carbohydrate Counting for Diabetes Mellitus, Adult Carbohydrate counting is a method for keeping track of how many carbohydrates you eat. Eating carbohydrates naturally increases the amount of sugar (glucose) in the blood. Counting how many carbohydrates you eat helps  keep your blood glucose within normal limits, which helps you manage your diabetes (diabetes mellitus). It is important to know how many carbohydrates you can safely have in each meal. This is different for every person. A diet and nutrition specialist (registered dietitian) can help you make a meal plan and calculate how many carbohydrates you should have at each meal and snack. Carbohydrates are found in the following foods:  Grains, such as breads and cereals.  Dried beans and soy products.  Starchy vegetables, such as potatoes, peas, and corn.  Fruit and fruit juices.  Milk and  yogurt.  Sweets and snack foods, such as cake, cookies, candy, chips, and soft drinks.  How do I count carbohydrates? There are two ways to count carbohydrates in food. You can use either of the methods or a combination of both. Reading "Nutrition Facts" on packaged food The "Nutrition Facts" list is included on the labels of almost all packaged foods and beverages in the [place] It includes:  The serving size.  Information about nutrients in each serving, including the grams (g) of carbohydrate per serving.  To use the "Nutrition Facts":  Decide how many servings you will have.  Multiply the number of servings by the number of carbohydrates per serving.  The resulting number is the total amount of carbohydrates that you will be having.  Learning standard serving sizes of other foods When you eat foods containing carbohydrates that are not packaged or do not include "Nutrition Facts" on the label, you need to measure the servings in order to count the amount of carbohydrates:  Measure the foods that you will eat with a food scale or measuring cup, if needed.  Decide how many standard-size servings you will eat.  Multiply the number of servings by 15. Most carbohydrate-rich foods have about 15 g of carbohydrates per serving. ? For example, if you eat 8 oz (170 g) of strawberries, you will have eaten 2 servings and 30 g of carbohydrates (2 servings x 15 g = 30 g).  For foods that have more than one food mixed, such as soups and casseroles, you must count the carbohydrates in each food that is included.  The following list contains standard serving sizes of common carbohydrate-rich foods. Each of these servings has about 15 g of carbohydrates:   hamburger bun or  English muffin.   oz (15 mL) syrup.   oz (14 g) jelly.  1 slice of bread.  1 six-inch tortilla.  3 oz (85 g) cooked rice or pasta.  4 oz (113 g) cooked dried beans.  4 oz (113 g) starchy vegetable, such as  peas, corn, or potatoes.  4 oz (113 g) hot cereal.  4 oz (113 g) mashed potatoes or  of a large baked potato.  4 oz (113 g) canned or frozen fruit.  4 oz (120 mL) fruit juice.  4-6 crackers.  6 chicken nuggets.  6 oz (170 g) unsweetened dry cereal.  6 oz (170 g) plain fat-free yogurt or yogurt sweetened with artificial sweeteners.  8 oz (240 mL) milk.  8 oz (170 g) fresh fruit or one small piece of fruit.  24 oz (680 g) popped popcorn.  Example of carbohydrate counting Sample meal  3 oz (85 g) chicken breast.  6 oz (170 g) brown rice.  4 oz (113 g) corn.  8 oz (240 mL) milk.  8 oz (170 g) strawberries with sugar-free whipped topping. Carbohydrate calculation 1. Identify the foods that contain carbohydrates: ?  Rice. ? Corn. ? Milk. ? Strawberries. 2. Calculate how many servings you have of each food: ? 2 servings rice. ? 1 serving corn. ? 1 serving milk. ? 1 serving strawberries. 3. Multiply each number of servings by 15 g: ? 2 servings rice x 15 g = 30 g. ? 1 serving corn x 15 g = 15 g. ? 1 serving milk x 15 g = 15 g. ? 1 serving strawberries x 15 g = 15 g. 4. Add together all of the amounts to find the total grams of carbohydrates eaten: ? 30 g + 15 g + 15 g + 15 g = 75 g of carbohydrates total. This information is not intended to replace advice given to you by your health care provider. Make sure you discuss any questions you have with your health care provider. Document Released: 05/26/2005 Document Revised: 12/14/2015 Document Reviewed: 11/07/2015 Elsevier Interactive Patient Education  2018 [employer].   Tips for Eating Away From Home If You Have Diabetes Controlling your level of blood glucose, also known as blood sugar, can be challenging. It can be even more difficult when you do not prepare your own meals. The following tips can help you manage your diabetes when you eat away from home. Planning ahead Plan ahead if you know you will be eating  away from home:  Ask your health care provider how to time meals and medicine if you are taking insulin.  Make a list of restaurants near you that offer healthy choices. If they have a carry-out menu, take it home and plan what you will order ahead of time.  Look up the restaurant you want to eat at online. Many chain and fast-food restaurants list nutritional information online. Use this information to choose the healthiest options and to calculate how many carbohydrates will be in your meal.  Use a carbohydrate-counting book or mobile app to look up the carbohydrate content and serving size of the foods you want to eat.  Become familiar with serving sizes and learn to recognize how many servings are in a portion. This will allow you to estimate how many carbohydrates you can eat.  Free foods A "free food" is any food or drink that has less than 5 g of carbohydrates per serving. Free foods include:  Many vegetables.  Hard boiled eggs.  Nuts or seeds.  Olives.  Cheeses.  Meats.  These types of foods make good appetizer choices and are often available at salad bars. Lemon juice, vinegar, or a low-calorie salad dressing of fewer than 20 calories per serving can be used as a "free" salad dressing. Choices to reduce carbohydrates  Substitute nonfat sweetened yogurt with a sugar-free yogurt. Yogurt made from soy milk may also be used, but you will still want a sugar-free or plain option to choose a lower carbohydrate amount.  Ask your server to take away the bread basket or chips from your table.  Order fresh fruit. A salad bar often offers fresh fruit choices. Avoid canned fruit because it is usually packed in sugar or syrup.  Order a salad, and eat it without dressing. Or, create a "free" salad dressing.  Ask for substitutions. For example, instead of [place] fries, request an order of a vegetable such as salad, green beans, or broccoli. Other tips  If you take insulin, take the  insulin once your food arrives to your table. This will ensure your insulin and food are timed correctly.  Ask your server about the portion  size before your order, and ask for a take-out box if the portion has more servings than you should have. When your food comes, leave the amount you should have on the plate, and put the rest in the take-out box.  Consider splitting an entree with someone and ordering a side salad. This information is not intended to replace advice given to you by your health care provider. Make sure you discuss any questions you have with your health care provider. Document Released: 05/26/2005 Document Revised: 11/01/2015 Document Reviewed: 08/23/2013 Elsevier Interactive Patient Education  [employer]2018 [employer]

## 2017-06-19 ENCOUNTER — Encounter: Payer: Self-pay | Admitting: Nurse Practitioner

## 2017-06-19 ENCOUNTER — Ambulatory Visit: Payer: Self-pay | Attending: Nurse Practitioner | Admitting: Nurse Practitioner

## 2017-06-19 VITALS — BP 121/77 | HR 90 | Temp 98.4°F | Resp 12 | Ht 65.0 in | Wt 315.6 lb

## 2017-06-19 DIAGNOSIS — Z6841 Body Mass Index (BMI) 40.0 and over, adult: Secondary | ICD-10-CM

## 2017-06-19 DIAGNOSIS — Z0189 Encounter for other specified special examinations: Secondary | ICD-10-CM | POA: Insufficient documentation

## 2017-06-19 DIAGNOSIS — E1169 Type 2 diabetes mellitus with other specified complication: Secondary | ICD-10-CM | POA: Insufficient documentation

## 2017-06-19 DIAGNOSIS — E118 Type 2 diabetes mellitus with unspecified complications: Secondary | ICD-10-CM

## 2017-06-19 DIAGNOSIS — E1165 Type 2 diabetes mellitus with hyperglycemia: Secondary | ICD-10-CM | POA: Insufficient documentation

## 2017-06-19 DIAGNOSIS — J45909 Unspecified asthma, uncomplicated: Secondary | ICD-10-CM | POA: Insufficient documentation

## 2017-06-19 DIAGNOSIS — Z794 Long term (current) use of insulin: Secondary | ICD-10-CM | POA: Insufficient documentation

## 2017-06-19 DIAGNOSIS — E1140 Type 2 diabetes mellitus with diabetic neuropathy, unspecified: Secondary | ICD-10-CM

## 2017-06-19 LAB — GLUCOSE, POCT (MANUAL RESULT ENTRY): POC Glucose: 263 mg/dl — AB (ref 70–99)

## 2017-06-19 MED ORDER — SITAGLIPTIN PHOSPHATE 100 MG PO TABS: 100.0000 mg | ORAL_TABLET | Freq: Every day | ORAL | 3 refills | Status: DC

## 2017-06-19 MED ORDER — INSULIN GLARGINE 100 UNIT/ML ~~LOC~~ SOLN: 25.0000 [IU] | Freq: Every day | SUBCUTANEOUS | 11 refills | Status: DC

## 2017-06-19 MED ORDER — GABAPENTIN 300 MG PO CAPS: 300.0000 mg | ORAL_CAPSULE | Freq: Two times a day (BID) | ORAL | 1 refills | Status: DC

## 2017-06-19 MED FILL — JANUVIA 100 MG TABLET: 100 | 30 days supply | Qty: 30 | Fill #0

## 2017-06-19 MED FILL — GABAPENTIN 300 MG CAPSULE: 300 | 30 days supply | Qty: 60 | Fill #0

## 2017-06-19 MED FILL — !LANTUS 100 UNITS/ML VIAL: 100 | 28 days supply | Qty: 10 | Fill #0

## 2017-06-19 NOTE — Progress Notes (Signed)
Assessment & Plan:  Diana Joyce was seen today for establish care.  Diagnoses and all orders for this visit:  Type 2 diabetes mellitus with other specified complication, without long-term current use of insulin (HCC) -     Glucose (CBG) -     Referral to Nutrition and Diabetes Services -     Lipid panel -     Microalbumin / creatinine urine ratio -     insulin glargine (LANTUS) 100 UNIT/ML injection; Inject 0.25 mLs (25 Units total) into the skin at bedtime. -     Basic metabolic panel -     sitaGLIPtin (JANUVIA) 100 MG tablet; Take 1 tablet (100 mg total) by mouth daily.  Diabetes is poorly controlled. Advised patient to keep a fasting blood sugar log fast, 2 hours post lunch and bedtime which will be reviewed at the next office visit.  Severe obesity (BMI >= 40) (HCC) -     TSH Discussed diet and exercise for person with BMI >25. Instructed: You must burn more calories than you eat. Losing 5 percent of your body weight should be considered a success. In the longer term, losing more than 15 percent of your body weight and staying at this weight is an extremely good result. However, keep in mind that even losing 5 percent of your body weight leads to important health benefits, so try not to get discouraged if you're not able to lose more than this. Will recheck weight in 3-6 months.  Type 2 diabetes mellitus with diabetic neuropathy, unspecified whether long term insulin use (HCC) -     gabapentin (NEURONTIN) 300 MG capsule; Take 1 capsule (300 mg total) by mouth 2 (two) times daily.   Patient has been counseled on age-appropriate routine health concerns for screening and prevention. These are reviewed and up-to-date. Referrals have been placed accordingly. Immunizations are up-to-date or declined.    Subjective:   Chief Complaint  Patient presents with  . Establish Care    Patient is here to establish care. Patient stated she is here for diabetes and would like to talk about  testosterone. Patient also would like to know if she can get her insulin in a vial. Patient need insulin refills.    HPI Diana Joyce 27 y.o. female presents to office today to establish care as a new patient. She was diagnosed with DM Type 2 a little over a year ago.   DMII  Checking her blood sugars sporadically during the day with lows-highs 180-300s. She works from 7pm-7am and monitors her blood sugars a few hours after sleeping (aorund 10am).  Denies hypoglycemic symptoms. She would like to switch from the lantus pen to the vial.She keeps forgetting to place the needles in her lantus pen. She feels more comfortable with the vials and syringes. She was increased to 25 units of lantus from 20 units in 05-28-2017. She could not tolerate metformin due to GI issues. She is endorsing neuropathy of right foot. Will start gabapentin. She needs diabetes education. She is morbidly obese. Will also start Januvia. Blood pressure well controlled today. [facility]. Has questions today regarding testosterone and diabetes. We discussed possible effects of testosterone in regards to hypoglycemia. She verbalized understanding.  Lab Results  Component Value Date   HGBA1C 12.5 05/07/2017    Morbid Obesity She does not eat on a schedule. She states "I don't eat a lot". She does not exercise.  She needs to speak with a nutritionist. Her eating habits are not healthy and conducive to managing diabetes appropriately.      Review of Systems  Constitutional: Negative for fever, malaise/fatigue and weight loss.  HENT: Negative.  Negative for nosebleeds.   Eyes: Negative.  Negative for blurred vision, double vision and photophobia.  Respiratory: Negative.  Negative for cough and shortness of breath.   Cardiovascular: Negative.  Negative for chest pain, palpitations and leg swelling.  Gastrointestinal: Negative.  Negative for  abdominal pain, constipation, diarrhea, heartburn, nausea and vomiting.  Musculoskeletal: Negative.  Negative for myalgias.  Neurological: Positive for tingling and sensory change. Negative for dizziness, focal weakness, seizures and headaches.  Endo/Heme/Allergies: Negative for environmental allergies.  Psychiatric/Behavioral: Negative.  Negative for suicidal ideas.    Past Medical History:  Diagnosis Date  . Asthma   . Diabetes [facility])     Past Surgical History:  Procedure Laterality Date  . FRACTURE SURGERY      Family History  Problem Relation Age of Onset  . Kidney failure Mother   . Hypertension Mother   . Diabetes Mother   . Heart failure Mother   . Diabetes Father     Social History Reviewed with no changes to be made today.   Outpatient Medications Prior to Visit  Medication Sig Dispense Refill  . blood glucose meter kit and supplies KIT Dispense based on patient and insurance preference. Use up to four times daily as directed. (FOR ICD-9 250.00, 250.01). 1 each 0  . Blood Glucose Monitoring Suppl (TRUE METRIX METER) w/Device KIT Check blood sugars fasting and at bedtime daily 1 kit 1  . glucose blood (TRUE METRIX BLOOD GLUCOSE TEST) test strip Use as instructed 100 each 12  . TRUEPLUS LANCETS 28G MISC Check blood sugars fasting and at bedtime 100 each 1  . hydrOXYzine (ATARAX/VISTARIL) 25 MG tablet Take 1-2 tablets (25-50 mg total) by mouth every 6 (six) hours as needed for itching. (Patient not taking: Reported on 03/08/2017) 20 tablet 0  . ibuprofen (ADVIL,MOTRIN) 600 MG tablet Take 1 tablet (600 mg total) by mouth every 6 (six) hours as needed. (Patient not taking: Reported on 06/19/2017) 30 tablet 0  . Insulin Pen Needle (PEN NEEDLES) 32G X 4 MM MISC 1 Dose by Does not apply route daily. (Patient not taking: Reported on 06/19/2017) 100 each 1  . naproxen (NAPROSYN) 500 MG tablet Take 1 tablet (500 mg total) 2 (two) times daily by mouth. (Patient not taking: Reported on  06/19/2017) 30 tablet 0  . Pheniramine-PE-APAP (THERAFLU COLD & SORE THROAT) 20-10-325 MG PACK Take 1 packet by mouth daily as needed (cold symptoms).    . Insulin Glargine (LANTUS SOLOSTAR) 100 UNIT/ML Solostar Pen Inject 25 Units into the skin daily at 10 pm. (Patient not taking: Reported on 06/19/2017) 5 pen 2   No facility-administered medications prior to visit.     Allergies  Allergen Reactions  . Benadryl [Diphenhydramine Hcl] Other (See Comments)    Hives, coughing, itching.        Objective:    BP 121/77 (BP Location: Left Arm, Patient Position: Sitting, Cuff Size: Normal)   Pulse 90   Temp 98.4 F (36.9 C) (Oral)   Resp 12   Ht _0  (1.651 m)   Wt (!) 315 lb 9.6 oz (143.2 kg)   SpO2 97%   BMI 52.52 kg/m  Wt Readings from Last 3 Encounters:  06/19/17 (!) 315 lb 9.6 oz (143.2 kg)  05/07/17 Marland Kitchen)  306 lb 12.8 oz (139.2 kg)  04/27/17 (!) 305 lb 6 oz (138.5 kg)    Physical Exam  Constitutional: She is oriented to person, place, and time. She appears well-developed and well-nourished. She is cooperative.  HENT:  Head: Normocephalic and atraumatic.  Eyes: EOM are normal.  Neck: Normal range of motion.  Cardiovascular: Normal rate, regular rhythm, normal heart sounds and intact distal pulses. Exam reveals no gallop and no friction rub.  No murmur heard. Pulmonary/Chest: Effort normal and breath sounds normal. No tachypnea. No respiratory distress. She has no decreased breath sounds. She has no wheezes. She has no rhonchi. She has no rales. She exhibits no tenderness.  Abdominal: Soft. Bowel sounds are normal.  Musculoskeletal: Normal range of motion. She exhibits no edema.       Right foot: There is normal range of motion, no tenderness, no bony tenderness and no swelling.  Neurological: She is alert and oriented to person, place, and time. She has normal strength. Coordination and gait normal.  Skin: Skin is warm and dry.  Psychiatric: She has a normal mood and affect.  Her behavior is normal. Judgment and thought content normal.  Nursing note and vitals reviewed.      Patient has been counseled extensively about nutrition and exercise as well as the importance of adherence with medications and regular follow-up. The patient was given clear instructions to go to ER or return to medical center if symptoms don't improve, worsen or new problems develop. The patient verbalized understanding.   Follow-up: Return in about 7 weeks (around 08/07/2017) for DM; Needs appointment with financial representative.Gildardo Pounds, FNP-BC [facility] and [facility] [place], [place]   06/19/2017, 10:14 AM

## 2017-06-19 NOTE — Patient Instructions (Addendum)
Diabetes Mellitus and Nutrition When you have diabetes (diabetes mellitus), it is very important to have healthy eating habits because your blood sugar (glucose) levels are greatly affected by what you eat and drink. Eating healthy foods in the appropriate amounts, at about the same times every day, can help you:  Control your blood glucose.  Lower your risk of heart disease.  Improve your blood pressure.  Reach or maintain a healthy weight.  Every person with diabetes is different, and each person has different needs for a meal plan. Your health care provider may recommend that you work with a diet and nutrition specialist (dietitian) to make a meal plan that is best for you. Your meal plan may vary depending on factors such as:  The calories you need.  The medicines you take.  Your weight.  Your blood glucose, blood pressure, and cholesterol levels.  Your activity level.  Other health conditions you have, such as heart or kidney disease.  How do carbohydrates affect me? Carbohydrates affect your blood glucose level more than any other type of food. Eating carbohydrates naturally increases the amount of glucose in your blood. Carbohydrate counting is a method for keeping track of how many carbohydrates you eat. Counting carbohydrates is important to keep your blood glucose at a healthy level, especially if you use insulin or take certain oral diabetes medicines. It is important to know how many carbohydrates you can safely have in each meal. This is different for every person. Your dietitian can help you calculate how many carbohydrates you should have at each meal and for snack. Foods that contain carbohydrates include:  Bread, cereal, rice, pasta, and crackers.  Potatoes and corn.  Peas, beans, and lentils.  Milk and yogurt.  Fruit and juice.  Desserts, such as cakes, cookies, ice cream, and candy.  How does alcohol affect me? Alcohol can cause a sudden decrease in blood  glucose (hypoglycemia), especially if you use insulin or take certain oral diabetes medicines. Hypoglycemia can be a life-threatening condition. Symptoms of hypoglycemia (sleepiness, dizziness, and confusion) are similar to symptoms of having too much alcohol. If your health care provider says that alcohol is safe for you, follow these guidelines:  Limit alcohol intake to no more than 1 drink per day for nonpregnant women and 2 drinks per day for men. One drink equals 12 oz of beer, 5 oz of wine, or 1 oz of hard liquor.  Do not drink on an empty stomach.  Keep yourself hydrated with water, diet soda, or unsweetened iced tea.  Keep in mind that regular soda, juice, and other mixers may contain a lot of sugar and must be counted as carbohydrates.  What are tips for following this plan? Reading food labels  Start by checking the serving size on the label. The amount of calories, carbohydrates, fats, and other nutrients listed on the label are based on one serving of the food. Many foods contain more than one serving per package.  Check the total grams (g) of carbohydrates in one serving. You can calculate the number of servings of carbohydrates in one serving by dividing the total carbohydrates by 15. For example, if a food has 30 g of total carbohydrates, it would be equal to 2 servings of carbohydrates.  Check the number of grams (g) of saturated and trans fats in one serving. Choose foods that have low or no amount of these fats.  Check the number of milligrams (mg) of sodium in one serving. Most people   should limit total sodium intake to less than 2,300 mg per day.  Always check the nutrition information of foods labeled as "low-fat" or "nonfat". These foods may be higher in added sugar or refined carbohydrates and should be avoided.  Talk to your dietitian to identify your daily goals for nutrients listed on the label. Shopping  Avoid buying canned, premade, or processed foods. These  foods tend to be high in fat, sodium, and added sugar.  Shop around the outside edge of the grocery store. This includes fresh fruits and vegetables, bulk grains, fresh meats, and fresh dairy. Cooking  Use low-heat cooking methods, such as baking, instead of high-heat cooking methods like deep frying.  Cook using healthy oils, such as olive, canola, or sunflower oil.  Avoid cooking with butter, cream, or high-fat meats. Meal planning  Eat meals and snacks regularly, preferably at the same times every day. Avoid going long periods of time without eating.  Eat foods high in fiber, such as fresh fruits, vegetables, beans, and whole grains. Talk to your dietitian about how many servings of carbohydrates you can eat at each meal.  Eat 4-6 ounces of lean protein each day, such as lean meat, chicken, fish, eggs, or tofu. 1 ounce is equal to 1 ounce of meat, chicken, or fish, 1 egg, or 1/4 cup of tofu.  Eat some foods each day that contain healthy fats, such as avocado, nuts, seeds, and fish. Lifestyle   Check your blood glucose regularly.  Exercise at least 30 minutes 5 or more days each week, or as told by your health care provider.  Take medicines as told by your health care provider.  Do not use any products that contain nicotine or tobacco, such as cigarettes and e-cigarettes. If you need help quitting, ask your health care provider.  Work with a Veterinary surgeoncounselor or diabetes educator to identify strategies to manage stress and any emotional and social challenges. What are some questions to ask my health care provider?  Do I need to meet with a diabetes educator?  Do I need to meet with a dietitian?  What number can I call if I have questions?  When are the best times to check my blood glucose? Where to find more information:  American Diabetes Association: diabetes.org/food-and-fitness/food  Academy of Nutrition and Dietetics:  https://www.vargas.com/www.eatright.org/resources/health/diseases-and-conditions/diabetes  [employer] of Diabetes and Digestive and Kidney Diseases (NIH): FindJewelers.czwww.niddk.nih.gov/health-information/diabetes/overview/diet-eating-physical-activity Summary  A healthy meal plan will help you control your blood glucose and maintain a healthy lifestyle.  Working with a diet and nutrition specialist (dietitian) can help you make a meal plan that is best for you.  Keep in mind that carbohydrates and alcohol have immediate effects on your blood glucose levels. It is important to count carbohydrates and to use alcohol carefully. This information is not intended to replace advice given to you by your health care provider. Make sure you discuss any questions you have with your health care provider. Document Released: 02/20/2005 Document Revised: 06/30/2016 Document Reviewed: 06/30/2016 Elsevier Interactive Patient Education  2018 [employer]  Diabetes Mellitus and Nutrition When you have diabetes (diabetes mellitus), it is very important to have healthy eating habits because your blood sugar (glucose) levels are greatly affected by what you eat and drink. Eating healthy foods in the appropriate amounts, at about the same times every day, can help you:  Control your blood glucose.  Lower your risk of heart disease.  Improve your blood pressure.  Reach or maintain a healthy weight.  Every person with diabetes is different, and each person has different needs for a meal plan. Your health care provider may recommend that you work with a diet and nutrition specialist (dietitian) to make a meal plan that is best for you. Your meal plan may vary depending on factors such as:  The calories you need.  The medicines you take.  Your weight.  Your blood glucose, blood pressure, and cholesterol levels.  Your activity level.  Other health conditions you have, such as heart or kidney disease.  How do carbohydrates affect  me? Carbohydrates affect your blood glucose level more than any other type of food. Eating carbohydrates naturally increases the amount of glucose in your blood. Carbohydrate counting is a method for keeping track of how many carbohydrates you eat. Counting carbohydrates is important to keep your blood glucose at a healthy level, especially if you use insulin or take certain oral diabetes medicines. It is important to know how many carbohydrates you can safely have in each meal. This is different for every person. Your dietitian can help you calculate how many carbohydrates you should have at each meal and for snack. Foods that contain carbohydrates include:  Bread, cereal, rice, pasta, and crackers.  Potatoes and corn.  Peas, beans, and lentils.  Milk and yogurt.  Fruit and juice.  Desserts, such as cakes, cookies, ice cream, and candy.  How does alcohol affect me? Alcohol can cause a sudden decrease in blood glucose (hypoglycemia), especially if you use insulin or take certain oral diabetes medicines. Hypoglycemia can be a life-threatening condition. Symptoms of hypoglycemia (sleepiness, dizziness, and confusion) are similar to symptoms of having too much alcohol. If your health care provider says that alcohol is safe for you, follow these guidelines:  Limit alcohol intake to no more than 1 drink per day for nonpregnant women and 2 drinks per day for men. One drink equals 12 oz of beer, 5 oz of wine, or 1 oz of hard liquor.  Do not drink on an empty stomach.  Keep yourself hydrated with water, diet soda, or unsweetened iced tea.  Keep in mind that regular soda, juice, and other mixers may contain a lot of sugar and must be counted as carbohydrates.  What are tips for following this plan? Reading food labels  Start by checking the serving size on the label. The amount of calories, carbohydrates, fats, and other nutrients listed on the label are based on one serving of the food. Many  foods contain more than one serving per package.  Check the total grams (g) of carbohydrates in one serving. You can calculate the number of servings of carbohydrates in one serving by dividing the total carbohydrates by 15. For example, if a food has 30 g of total carbohydrates, it would be equal to 2 servings of carbohydrates.  Check the number of grams (g) of saturated and trans fats in one serving. Choose foods that have low or no amount of these fats.  Check the number of milligrams (mg) of sodium in one serving. Most people should limit total sodium intake to less than 2,300 mg per day.  Always check the nutrition information of foods labeled as "low-fat" or "nonfat". These foods may be higher in added sugar or refined carbohydrates and should be avoided.  Talk to your dietitian to identify your daily goals for nutrients listed on the label. Shopping  Avoid buying canned, premade, or processed foods. These foods tend to be high in fat, sodium, and  added sugar.  Shop around the outside edge of the grocery store. This includes fresh fruits and vegetables, bulk grains, fresh meats, and fresh dairy. Cooking  Use low-heat cooking methods, such as baking, instead of high-heat cooking methods like deep frying.  Cook using healthy oils, such as olive, canola, or sunflower oil.  Avoid cooking with butter, cream, or high-fat meats. Meal planning  Eat meals and snacks regularly, preferably at the same times every day. Avoid going long periods of time without eating.  Eat foods high in fiber, such as fresh fruits, vegetables, beans, and whole grains. Talk to your dietitian about how many servings of carbohydrates you can eat at each meal.  Eat 4-6 ounces of lean protein each day, such as lean meat, chicken, fish, eggs, or tofu. 1 ounce is equal to 1 ounce of meat, chicken, or fish, 1 egg, or 1/4 cup of tofu.  Eat some foods each day that contain healthy fats, such as avocado, nuts, seeds,  and fish. Lifestyle   Check your blood glucose regularly.  Exercise at least 30 minutes 5 or more days each week, or as told by your health care provider.  Take medicines as told by your health care provider.  Do not use any products that contain nicotine or tobacco, such as cigarettes and e-cigarettes. If you need help quitting, ask your health care provider.  Work with a Veterinary surgeon or diabetes educator to identify strategies to manage stress and any emotional and social challenges. What are some questions to ask my health care provider?  Do I need to meet with a diabetes educator?  Do I need to meet with a dietitian?  What number can I call if I have questions?  When are the best times to check my blood glucose? Where to find more information:  American Diabetes Association: diabetes.org/food-and-fitness/food  Academy of Nutrition and Dietetics: https://www.vargas.com/  [employer] of Diabetes and Digestive and Kidney Diseases (NIH): FindJewelers.cz Summary  A healthy meal plan will help you control your blood glucose and maintain a healthy lifestyle.  Working with a diet and nutrition specialist (dietitian) can help you make a meal plan that is best for you.  Keep in mind that carbohydrates and alcohol have immediate effects on your blood glucose levels. It is important to count carbohydrates and to use alcohol carefully. This information is not intended to replace advice given to you by your health care provider. Make sure you discuss any questions you have with your health care provider. Document Released: 02/20/2005 Document Revised: 06/30/2016 Document Reviewed: 06/30/2016 Elsevier Interactive Patient Education  [employer].

## 2017-06-20 LAB — BASIC METABOLIC PANEL
BUN / CREAT RATIO: 12 (ref 9–23)
BUN: 7 mg/dL (ref 6–20)
CHLORIDE: 100 mmol/L (ref 96–106)
CO2: 23 mmol/L (ref 20–29)
Calcium: 9.1 mg/dL (ref 8.7–10.2)
Creatinine, Ser: 0.58 mg/dL (ref 0.57–1.00)
GFR calc non Af Amer: 129 mL/min/{1.73_m2} (ref >59)
GFR, EST AFRICAN AMERICAN: 148 mL/min/{1.73_m2} (ref >59)
GLUCOSE: 320 mg/dL — AB (ref 65–99)
POTASSIUM: 4.4 mmol/L (ref 3.5–5.2)
Sodium: 136 mmol/L (ref 134–144)

## 2017-06-20 LAB — MICROALBUMIN / CREATININE URINE RATIO
CREATININE, UR: 252.2 mg/dL
MICROALB/CREAT RATIO: 5.6 mg/g{creat} (ref 0.0–30.0)
Microalbumin, Urine: 14 ug/mL

## 2017-06-20 LAB — TSH: TSH: 3.17 u[IU]/mL (ref 0.450–4.500)

## 2017-06-20 LAB — LIPID PANEL
CHOL/HDL RATIO: 5 ratio — AB (ref 0.0–4.4)
Cholesterol, Total: 169 mg/dL (ref 100–199)
HDL: 34 mg/dL — AB (ref >39)
LDL Calculated: 95 mg/dL (ref 0–99)
TRIGLYCERIDES: 198 mg/dL — AB (ref 0–149)
VLDL Cholesterol Cal: 40 mg/dL (ref 5–40)

## 2017-07-06 ENCOUNTER — Encounter: Payer: Self-pay | Attending: Nurse Practitioner | Admitting: Registered"

## 2017-07-06 ENCOUNTER — Encounter: Payer: Self-pay | Admitting: Registered"

## 2017-07-06 DIAGNOSIS — Z713 Dietary counseling and surveillance: Secondary | ICD-10-CM | POA: Insufficient documentation

## 2017-07-06 DIAGNOSIS — E118 Type 2 diabetes mellitus with unspecified complications: Secondary | ICD-10-CM

## 2017-07-06 DIAGNOSIS — E1169 Type 2 diabetes mellitus with other specified complication: Secondary | ICD-10-CM | POA: Insufficient documentation

## 2017-07-06 NOTE — Patient Instructions (Addendum)
Consider asking your primary care doctor for a referral for a sleep study. Consider asking for a referral to a podiatrist to get recommendation for shoes Great job and staying active! This helps control blood sugar. Tell your doctor if you start having low blood sugar Aim for 3-4 Carb Choices per meal (45-60 grams)   Aim for 0-2 Carbs (0-30 g) per snack if hungry  Include protein with your meals and snacks Consider reading food labels for Total Carbohydrate and saturated Fat Grams of foods Consider checking blood sugar at alternate times per day Consider checking with insurance company on their coverage of the [employer] Libre continuous glucose monitor Continue taking medication as directed by MD

## 2017-07-06 NOTE — Progress Notes (Signed)
Diabetes Self-Management Education  Visit Type: First/Initial  Appt. Start Time: 0800 Appt. End Time: 4270  07/06/2017  Diana Joyce, identified by name and date of birth, is a 27 y.o. female with a diagnosis of Diabetes: Type 2.   ASSESSMENT Patient states she has had pre-diabetes since 2013, diagnosed with T2DM last year with an A1c of 12.5%. Patient states both parents had diabetes and she has decided she needs to take managing her DM more seriously so she doesn't end up like her mother who has passed away.  Patient states she has been working her current shift of 7 pm - 7 am since August and her eating habits are not structured and usually only eats one meal on the days she is working. Patient states before starting on the Januvia her BG was running as high as 530 mg/dL without symptoms, but starts feeling bad when gets down to 122, Patient states she understands that is typical when one is used to having high BG.  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  Patient states she sometimes has snacks out of the vending machine and usually gets trail mix. Patient states the meat she usually eats is either fish (talapia & salmon) or chicken. Patient states she is concerned about her triglycerides.   Patient states she has some neuropathy in her right foot and has not been checking her feet.  Diabetes Self-Management Education - 07/06/17 0809      Visit Information   Visit Type  First/Initial      Initial Visit   Diabetes Type  Type 2    Are you currently following a meal plan?  No    Are you taking your medications as prescribed?  Yes    Date Diagnosed  1 year ago      Health Coping   How would you rate your overall health?  Very Poor      Psychosocial Assessment   Patient Belief/Attitude about Diabetes  Other (comment) getting out of denial stage    What is the last grade level you completed  in school?  sophomore of college       Complications   Last HgB A1C per patient/outside source  12.5 %    How often do you check your blood sugar?  1-2 times/day    Fasting Blood glucose range (mg/dL)  130-179    Number of hypoglycemic episodes per month  0    Have you had a dilated eye exam in the past 12 months?  No    Have you had a dental exam in the past 12 months?  No    Are you checking your feet?  No      Dietary Intake   Breakfast  bbq chicken, mac & cheese OR chicken patties OR bacon OR fastfood 4/$4 3:30 pm    Snack (morning)  vending machine trailmix    Lunch  tried fruit & kale smoothies, may not continue    Beverage(s)  juice, water used to drink 1 gal,       Exercise   Exercise Type  Light (walking / raking leaves) walks most of 12 hr shift    How many days per week to you exercise?  2    How many minutes per day do you exercise?  30    Total minutes per week of exercise  60  Patient Education   Nutrition management   Role of diet in the treatment of diabetes and the relationship between the three main macronutrients and blood glucose level;Carbohydrate counting;Food label reading, portion sizes and measuring food.    Physical activity and exercise   Role of exercise on diabetes management, blood pressure control and cardiac health.    Monitoring  Identified appropriate SMBG and/or A1C goals.    Acute complications  Taught treatment of hypoglycemia - the 15 rule.    Chronic complications  Assessed and discussed foot care and prevention of foot problems    Personal strategies to promote health  Other (comment) test for OSA and treat if applicable      Individualized Goals (developed by patient)   Nutrition  General guidelines for healthy choices and portions discussed    Physical Activity  Exercise 5-7 days per week    Monitoring   test my blood glucose as discussed      Outcomes   Expected Outcomes  Demonstrated interest in learning. Expect positive outcomes     Future DMSE  4-6 wks    Program Status  Not Completed     Individualized Plan for Diabetes Self-Management Training:   Learning Objective:  Patient will have a greater understanding of diabetes self-management. Patient education plan is to attend individual and/or group sessions per assessed needs and concerns.  Patient Instructions  Consider asking your primary care doctor for a referral for a sleep study. Consider asking for a referral to a podiatrist to get recommendation for shoes Great job and staying active! This helps control blood sugar. Tell your doctor if you start having low blood sugar Aim for 3-4 Carb Choices per meal (45-60 grams)   Aim for 0-2 Carbs (0-30 g) per snack if hungry  Include protein with your meals and snacks Consider reading food labels for Total Carbohydrate and saturated Fat Grams of foods Consider checking blood sugar at alternate times per day Consider checking with insurance company on their coverage of the [employer] continuous glucose monitor Continue taking medication as directed by MD  Expected Outcomes:  Demonstrated interest in learning. Expect positive outcomes  Education material provided: Living Well with Diabetes, A1C conversion sheet, My Plate, Snack sheet and Carbohydrate counting sheet, Types of Fat, Cholesterol & Triglycerides, FreeStyle Libre brochure, sleep hygiene  If problems or questions, patient to contact team via:  Phone and MyChart  Future DSME appointment: 4-6 wks

## 2017-07-16 ENCOUNTER — Telehealth: Payer: Self-pay | Admitting: Nurse Practitioner

## 2017-07-16 NOTE — Telephone Encounter (Signed)
Dorene Grebeatalie called from planned parent hood regarding patient Diabetes and initiating hormone therapy. Please fu asap.

## 2017-07-17 ENCOUNTER — Telehealth: Payer: Self-pay | Admitting: General Practice

## 2017-07-17 NOTE — Telephone Encounter (Signed)
S/W Diana GrebeNatalie. Discussed Testosterone administration in regards to female to female transition and Shadonna's current A1c. Unless her A1c becomes more controlled (<8) I will not give approval for testosterone to be administered. She needs to have better control of her diabetes. She was a no show for one appointment and canceled another appointment with me. Her next A1c is due in March.

## 2017-07-17 NOTE — Telephone Encounter (Signed)
Will route to PCP 

## 2017-07-17 NOTE — Telephone Encounter (Signed)
Please call Naralie back at [phone]. Needs to talk about DM before starting hormone therapy. Please fu asap

## 2017-07-20 NOTE — Telephone Encounter (Signed)
Call has been placed. See notes section

## 2017-07-24 ENCOUNTER — Ambulatory Visit: Payer: Self-pay | Attending: Nurse Practitioner | Admitting: Licensed Clinical Social Worker

## 2017-07-24 ENCOUNTER — Ambulatory Visit: Payer: Self-pay | Attending: Nurse Practitioner | Admitting: Nurse Practitioner

## 2017-07-24 ENCOUNTER — Encounter: Payer: Self-pay | Admitting: Nurse Practitioner

## 2017-07-24 VITALS — BP 111/74 | HR 91 | Temp 98.9°F | Ht 65.0 in | Wt 305.4 lb

## 2017-07-24 DIAGNOSIS — Z79899 Other long term (current) drug therapy: Secondary | ICD-10-CM | POA: Insufficient documentation

## 2017-07-24 DIAGNOSIS — F419 Anxiety disorder, unspecified: Secondary | ICD-10-CM | POA: Insufficient documentation

## 2017-07-24 DIAGNOSIS — M546 Pain in thoracic spine: Secondary | ICD-10-CM | POA: Insufficient documentation

## 2017-07-24 DIAGNOSIS — G4733 Obstructive sleep apnea (adult) (pediatric): Secondary | ICD-10-CM | POA: Insufficient documentation

## 2017-07-24 DIAGNOSIS — E1169 Type 2 diabetes mellitus with other specified complication: Secondary | ICD-10-CM | POA: Insufficient documentation

## 2017-07-24 DIAGNOSIS — J45909 Unspecified asthma, uncomplicated: Secondary | ICD-10-CM | POA: Insufficient documentation

## 2017-07-24 DIAGNOSIS — Z794 Long term (current) use of insulin: Secondary | ICD-10-CM | POA: Insufficient documentation

## 2017-07-24 DIAGNOSIS — G4700 Insomnia, unspecified: Secondary | ICD-10-CM | POA: Insufficient documentation

## 2017-07-24 DIAGNOSIS — Z888 Allergy status to other drugs, medicaments and biological substances status: Secondary | ICD-10-CM | POA: Insufficient documentation

## 2017-07-24 DIAGNOSIS — Z23 Encounter for immunization: Secondary | ICD-10-CM | POA: Insufficient documentation

## 2017-07-24 LAB — GLUCOSE, POCT (MANUAL RESULT ENTRY): POC GLUCOSE: 174 mg/dL — AB (ref 70–99)

## 2017-07-24 MED ORDER — INSULIN GLARGINE 100 UNIT/ML ~~LOC~~ SOLN: 25.0000 [IU] | Freq: Every day | SUBCUTANEOUS | 11 refills | Status: DC

## 2017-07-24 MED ORDER — IBUPROFEN 800 MG PO TABS: 800.0000 mg | ORAL_TABLET | Freq: Four times a day (QID) | ORAL | 1 refills | Status: AC | PRN

## 2017-07-24 MED ORDER — ALBUTEROL SULFATE HFA 108 (90 BASE) MCG/ACT IN AERS: 2.0000 | INHALATION_SPRAY | Freq: Four times a day (QID) | RESPIRATORY_TRACT | 0 refills | Status: DC | PRN

## 2017-07-24 MED ORDER — SITAGLIPTIN PHOSPHATE 100 MG PO TABS: 100.0000 mg | ORAL_TABLET | Freq: Every day | ORAL | 3 refills | Status: DC

## 2017-07-24 MED ORDER — TRAZODONE HCL 100 MG PO TABS: 100.0000 mg | ORAL_TABLET | Freq: Every day | ORAL | 3 refills | Status: DC

## 2017-07-24 MED ORDER — HYDROXYZINE HCL 50 MG PO TABS: 50.0000 mg | ORAL_TABLET | Freq: Four times a day (QID) | ORAL | 1 refills | Status: DC | PRN

## 2017-07-24 MED ORDER — HYDROXYZINE HCL 50 MG PO TABS: 50.0000 mg | ORAL_TABLET | Freq: Four times a day (QID) | ORAL | 1 refills | Status: AC | PRN

## 2017-07-24 MED FILL — traZODone HCL 100 MG TABS: 100 | 30 days supply | Qty: 30 | Fill #0

## 2017-07-24 MED FILL — ?HYDROXYZINE PAM 50 MG CAP: 50 | 15 days supply | Qty: 60 | Fill #0

## 2017-07-24 MED FILL — !LANTUS 100 UNITS/ML VIAL: 100 | 28 days supply | Qty: 10 | Fill #0

## 2017-07-24 MED FILL — **JANUVIA 100 MG TABLET: 100 | 14 days supply | Qty: 14 | Fill #0

## 2017-07-24 NOTE — Progress Notes (Signed)
Assessment & Plan:  Diana Joyce was seen today for follow-up and back pain.  Diagnoses and all orders for this visit:  Acute midline thoracic back pain -     ibuprofen (ADVIL,MOTRIN) 800 MG tablet; Take 1 tablet (800 mg total) by mouth every 6 (six) hours as needed. May alternate with XS tylenol as instructed. Please wear a back brace at work to help align and strengthen your back muscles and spine   Type 2 diabetes mellitus with other specified complication, with long-term current use of insulin (HCC) -     Glucose (CBG) -     sitaGLIPtin (JANUVIA) 100 MG tablet; Take 1 tablet (100 mg total) by mouth daily. -     insulin glargine (LANTUS) 100 UNIT/ML injection; Inject 0.25 mLs (25 Units total) into the skin at bedtime.  Insomnia, unspecified type -     traZODone (DESYREL) 100 MG tablet; Take 1 tablet (100 mg total) by mouth at bedtime.  Obstructive sleep apnea syndrome -     Nocturnal polysomnography (NPSG); Future  Anxiety -     hydrOXYzine (ATARAX/VISTARIL) 50 MG tablet; Take 1 tablet (50 mg total) by mouth every 6 (six) hours as needed for anxiety. Depression screen PHQ 2/9 07/24/2017  Decreased Interest 1  Down, Depressed, Hopeless 0  PHQ - 2 Score 1  Altered sleeping 3  Tired, decreased energy 3  Change in appetite 1  Feeling bad or failure about yourself  0  Trouble concentrating 0  Moving slowly or fidgety/restless 1  Suicidal thoughts 0  PHQ-9 Score 9    Uncomplicated asthma, unspecified asthma severity, unspecified whether persistent -     albuterol (PROVENTIL HFA;VENTOLIN HFA) 108 (90 Base) MCG/ACT inhaler; Inhale 2 puffs into the lungs every 6 (six) hours as needed for wheezing or shortness of breath.  Immunization due -     Flu Vaccine QUAD 6+ mos PF IM (Fluarix Quad PF)    Patient has been counseled on age-appropriate routine health concerns for screening and prevention. These are reviewed and up-to-date. Referrals have been placed accordingly. Immunizations  are up-to-date or declined.    Subjective:   Chief Complaint  Patient presents with  . Follow-up    Patient is here to discuss sleep study. Patient stated she rarely been able to sleep lately and fall asleep randomly.   . Back Pain    Patient stated she been having back pain for 2 weeks due to her job from walking around.   HPI Diana Joyce 27 y.o. female presents to office today with complaints of back pain and requesting sleep study. She is also endorsing new onset insomnia over the past few weeks.   Back Pain Patient presents for presents evaluation of low back problems.  Symptoms have been present for a few weeks and include pain in thoracic area (aching, stabbing and sharp in character; 6/10 in severity). Initial inciting event: none. 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 Alleviating factors identifiable by patient are rest or when she is at home and doesn't have to work. Exacerbating factors identifiable by patient are sitting, standing, walking and bending. Treatments so far initiated by patient: ibuprofen 200-458m Previous lower back problems: none. Previous workup: none. Previous treatments: none.    Insomnia She endorses difficulty falling asleep. 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 The delay  is due to her A1c. She needs an A1c below 8 or below in order to proceed with the transition program.   DM II Checking glucose once a day randomly. I have instructed her to check fasting blood sugars only with a goal of 90-130. She reports her current average according to her meter reading is: 180s. She denies any symptoms of hypo or hyperglycemia.   OSA Her STOP-Bang Score=5. Will place referral for OSA screening.    Review of Systems  Constitutional: Positive for weight loss (intentional). Negative for fever and  malaise/fatigue.  HENT: Negative for nosebleeds.   Eyes: Negative.  Negative for blurred vision, double vision and photophobia.  Respiratory: Negative.  Negative for cough and shortness of breath.   Cardiovascular: Negative.  Negative for chest pain, palpitations and leg swelling.  Gastrointestinal: Negative for abdominal pain, constipation and diarrhea.  Musculoskeletal: Positive for back pain. Negative for falls and myalgias.  Neurological: Negative.  Negative for dizziness, focal weakness, seizures and headaches.  Psychiatric/Behavioral: Negative for suicidal ideas. The patient is nervous/anxious and has insomnia.     Past Medical History:  Diagnosis Date  . Asthma   . Diabetes (HCC)     Past Surgical History:  Procedure Laterality Date  . FRACTURE SURGERY      Family History  Problem Relation Age of Onset  . Kidney failure Mother   . Hypertension Mother   . Diabetes Mother   . Heart failure Mother   . Diabetes Father     Social History Reviewed with no changes to be made today.   Outpatient Medications Prior to Visit  Medication Sig Dispense Refill  . blood glucose meter kit and supplies KIT Dispense based on patient and insurance preference. Use up to four times daily as directed. (FOR ICD-9 250.00, 250.01). 1 each 0  . Blood Glucose Monitoring Suppl (TRUE METRIX METER) w/Device KIT Check blood sugars fasting and at bedtime daily 1 kit 1  . gabapentin (NEURONTIN) 300 MG capsule Take 1 capsule (300 mg total) by mouth 2 (two) times daily. 60 capsule 1  . glucose blood (TRUE METRIX BLOOD GLUCOSE TEST) test strip Use as instructed 100 each 12  . TRUEPLUS LANCETS 28G MISC Check blood sugars fasting and at bedtime 100 each 1  . sitaGLIPtin (JANUVIA) 100 MG tablet Take 1 tablet (100 mg total) by mouth daily. 30 tablet 3  . Insulin Pen Needle (PEN NEEDLES) 32G X 4 MM MISC 1 Dose by Does not apply route daily. (Patient not taking: Reported on 06/19/2017) 100 each 1  . hydrOXYzine  (ATARAX/VISTARIL) 25 MG tablet Take 1-2 tablets (25-50 mg total) by mouth every 6 (six) hours as needed for itching. (Patient not taking: Reported on 03/08/2017) 20 tablet 0  . ibuprofen (ADVIL,MOTRIN) 600 MG tablet Take 1 tablet (600 mg total) by mouth every 6 (six) hours as needed. (Patient not taking: Reported on 06/19/2017) 30 tablet 0  . insulin glargine (LANTUS) 100 UNIT/ML injection Inject 0.25 mLs (25 Units total) into the skin at bedtime. 10 mL 11  . naproxen (NAPROSYN) 500 MG tablet Take 1 tablet (500 mg total) 2 (two) times daily by mouth. (Patient not taking: Reported on 06/19/2017) 30 tablet 0  . Pheniramine-PE-APAP (THERAFLU COLD & SORE THROAT) 20-10-325 MG PACK Take 1 packet by mouth daily as needed (cold symptoms).     No facility-administered medications prior to visit.     Allergies  Allergen Reactions  . Benadryl [Diphenhydramine Hcl] Other (See Comments)      Hives, coughing, itching.        Objective:    BP 111/74 (BP Location: Left Arm, Patient Position: Sitting, Cuff Size: Large)   Pulse 91   Temp 98.9 F (37.2 C) (Oral)   Ht 5' 5" (1.651 m)   Wt (!) 305 lb 6.4 oz (138.5 kg)   SpO2 100%   BMI 50.82 kg/m  Wt Readings from Last 3 Encounters:  07/24/17 (!) 305 lb 6.4 oz (138.5 kg)  06/19/17 (!) 315 lb 9.6 oz (143.2 kg)  05/07/17 (!) 306 lb 12.8 oz (139.2 kg)    Physical Exam  Constitutional: She is oriented to person, place, and time. She appears well-developed and well-nourished. She is cooperative.  HENT:  Head: Normocephalic and atraumatic.  Neck: Normal range of motion.  Cardiovascular: Normal rate, regular rhythm and normal heart sounds. Exam reveals no gallop and no friction rub.  No murmur heard. Pulmonary/Chest: Effort normal and breath sounds normal. No tachypnea. No respiratory distress. She has no decreased breath sounds. She has no wheezes. She has no rhonchi. She has no rales. She exhibits no tenderness.  Abdominal: Soft. Bowel sounds are normal.    Musculoskeletal: Normal range of motion. She exhibits no edema or tenderness.       Thoracic back: She exhibits pain. She exhibits normal range of motion, no swelling, no edema, no deformity and no spasm.  She endorses pain when asked to stand from sitting position.   Neurological: She is alert and oriented to person, place, and time. She has normal strength. No cranial nerve deficit or sensory deficit. Coordination and gait normal.  Skin: Skin is warm and dry.  Psychiatric: She has a normal mood and affect. Her behavior is normal. Judgment and thought content normal.  Nursing note and vitals reviewed.      Patient has been counseled extensively about nutrition and exercise as well as the importance of adherence with medications and regular follow-up. The patient was given clear instructions to go to ER or return to medical center if symptoms don't improve, worsen or new problems develop. The patient verbalized understanding.   Follow-up: Return for Needs appointment with financial representative.Gildardo Pounds, FNP-BC [facility] and [facility] [place], [place]   07/24/2017, 1:04 PM

## 2017-07-24 NOTE — Patient Instructions (Addendum)
Screening for Sleep Apnea Sleep apnea is a condition in which breathing pauses or becomes shallow during sleep. Sleep apnea screening is a test to determine if you are at risk for sleep apnea. The test is easy and only takes a few minutes. Your health care provider may ask you to have this test in preparation for surgery or as part of a physical exam. What are the symptoms of sleep apnea? Common symptoms of sleep apnea include:  Snoring.  Restless sleep.  Daytime sleepiness.  Pauses in breathing.  Choking during sleep.  Irritability.  Forgetfulness.  Trouble thinking clearly.  Depression.  Personality changes.  Most people with sleep apnea are not aware that they have it. Why should I get screened? Getting screened for sleep apnea can help:  Ensure your safety. It is important for your health care providers to know whether or not you have sleep apnea, especially if you are having surgery or have other long-term (chronic) health conditions.  Improve your health and allow you to get a better night's rest. Restful sleep can help you: ? Have more energy. ? Lose weight. ? Improve high blood pressure. ? Improve diabetes management. ? Prevent stroke. ? Prevent car accidents.  How is screening done? Screening usually includes being asked a list of questions about your sleep quality. Some questions you may be asked include:  Do you snore?  Is your sleep restless?  Do you have daytime sleepiness?  Has a partner or spouse told you that you stop breathing during sleep?  Have you had trouble concentrating or memory loss?  If your screening test is positive, you are at risk for the condition. Further testing may be needed to confirm a diagnosis of sleep apnea. Where to find more information: You can find screening tools online or at your health care clinic. For more information about sleep apnea screening and healthy sleep, visit these websites:  Centers for Disease Control  and Prevention: DetailSports.is  American Sleep Apnea Association: www.sleepapnea.org  Contact a health care provider if:  You think that you may have sleep apnea. Summary  Sleep apnea screening can help determine if you are at risk for sleep apnea.  It is important for your health care providers to know whether or not you have sleep apnea, especially if you are having surgery or have other chronic health conditions.  You may be asked to take a screening test for sleep apnea in preparation for surgery or as part of a physical exam. This information is not intended to replace advice given to you by your health care provider. Make sure you discuss any questions you have with your health care provider. Document Released: 09/05/2016 Document Revised: 09/05/2016 Document Reviewed: 09/05/2016 Elsevier Interactive Patient Education  2018 [employer]. Insomnia Insomnia is a sleep disorder that makes it difficult to fall asleep or to stay asleep. Insomnia can cause tiredness (fatigue), low energy, difficulty concentrating, mood swings, and poor performance at work or school. There are three different ways to classify insomnia:  Difficulty falling asleep.  Difficulty staying asleep.  Waking up too early in the morning.  Any type of insomnia can be long-term (chronic) or short-term (acute). Both are common. Short-term insomnia usually lasts for three months or less. Chronic insomnia occurs at least three times a week for longer than three months. What are the causes? Insomnia may be caused by another condition, situation, or substance, such as:  Anxiety.  Certain medicines.  Gastroesophageal reflux disease (GERD) or other gastrointestinal  conditions.  Asthma or other breathing conditions.  Restless legs syndrome, sleep apnea, or other sleep disorders.  Chronic pain.  Menopause. This may include hot flashes.  Stroke.  Abuse of alcohol, tobacco, or illegal  drugs.  Depression.  Caffeine.  Neurological disorders, such as Alzheimer disease.  An overactive thyroid (hyperthyroidism).  The cause of insomnia may not be known. What increases the risk? Risk factors for insomnia include:  Gender. Women are more commonly affected than men.  Age. Insomnia is more common as you get older.  Stress. This may involve your professional or personal life.  Income. Insomnia is more common in people with lower income.  Lack of exercise.  Irregular work schedule or night shifts.  Traveling between different time zones.  What are the signs or symptoms? If you have insomnia, trouble falling asleep or trouble staying asleep is the main symptom. This may lead to other symptoms, such as:  Feeling fatigued.  Feeling nervous about going to sleep.  Not feeling rested in the morning.  Having trouble concentrating.  Feeling irritable, anxious, or depressed.  How is this treated? Treatment for insomnia depends on the cause. If your insomnia is caused by an underlying condition, treatment will focus on addressing the condition. Treatment may also include:  Medicines to help you sleep.  Counseling or therapy.  Lifestyle adjustments.  Follow these instructions at home:  Take medicines only as directed by your health care provider.  Keep regular sleeping and waking hours. Avoid naps.  Keep a sleep diary to help you and your health care provider figure out what could be causing your insomnia. Include: ? When you sleep. ? When you wake up during the night. ? How well you sleep. ? How rested you feel the next day. ? Any side effects of medicines you are taking. ? What you eat and drink.  Make your bedroom a comfortable place where it is easy to fall asleep: ? Put up shades or special blackout curtains to block light from outside. ? Use a white noise machine to block noise. ? Keep the temperature cool.  Exercise regularly as directed by  your health care provider. Avoid exercising right before bedtime.  Use relaxation techniques to manage stress. Ask your health care provider to suggest some techniques that may work well for you. These may include: ? Breathing exercises. ? Routines to release muscle tension. ? Visualizing peaceful scenes.  Cut back on alcohol, caffeinated beverages, and cigarettes, especially close to bedtime. These can disrupt your sleep.  Do not overeat or eat spicy foods right before bedtime. This can lead to digestive discomfort that can make it hard for you to sleep.  Limit screen use before bedtime. This includes: ? Watching TV. ? Using your smartphone, tablet, and computer.  Stick to a routine. This can help you fall asleep faster. Try to do a quiet activity, brush your teeth, and go to bed at the same time each night.  Get out of bed if you are still awake after 15 minutes of trying to sleep. Keep the lights down, but try reading or doing a quiet activity. When you feel sleepy, go back to bed.  Make sure that you drive carefully. Avoid driving if you feel very sleepy.  Keep all follow-up appointments as directed by your health care provider. This is important. Contact a health care provider if:  You are tired throughout the day or have trouble in your daily routine due to sleepiness.  You continue to  have sleep problems or your sleep problems get worse. Get help right away if:  You have serious thoughts about hurting yourself or someone else. This information is not intended to replace advice given to you by your health care provider. Make sure you discuss any questions you have with your health care provider. Document Released: 05/23/2000 Document Revised: 10/26/2015 Document Reviewed: 02/24/2014 Elsevier Interactive Patient Education  2018 [employer]. Insomnia Insomnia is a sleep disorder that makes it difficult to fall asleep or to stay asleep. Insomnia can cause tiredness (fatigue),  low energy, difficulty concentrating, mood swings, and poor performance at work or school. There are three different ways to classify insomnia:  Difficulty falling asleep.  Difficulty staying asleep.  Waking up too early in the morning.  Any type of insomnia can be long-term (chronic) or short-term (acute). Both are common. Short-term insomnia usually lasts for three months or less. Chronic insomnia occurs at least three times a week for longer than three months. What are the causes? Insomnia may be caused by another condition, situation, or substance, such as:  Anxiety.  Certain medicines.  Gastroesophageal reflux disease (GERD) or other gastrointestinal conditions.  Asthma or other breathing conditions.  Restless legs syndrome, sleep apnea, or other sleep disorders.  Chronic pain.  Menopause. This may include hot flashes.  Stroke.  Abuse of alcohol, tobacco, or illegal drugs.  Depression.  Caffeine.  Neurological disorders, such as Alzheimer disease.  An overactive thyroid (hyperthyroidism).  The cause of insomnia may not be known. What increases the risk? Risk factors for insomnia include:  Gender. Women are more commonly affected than men.  Age. Insomnia is more common as you get older.  Stress. This may involve your professional or personal life.  Income. Insomnia is more common in people with lower income.  Lack of exercise.  Irregular work schedule or night shifts.  Traveling between different time zones.  What are the signs or symptoms? If you have insomnia, trouble falling asleep or trouble staying asleep is the main symptom. This may lead to other symptoms, such as:  Feeling fatigued.  Feeling nervous about going to sleep.  Not feeling rested in the morning.  Having trouble concentrating.  Feeling irritable, anxious, or depressed.  How is this treated? Treatment for insomnia depends on the cause. If your insomnia is caused by an  underlying condition, treatment will focus on addressing the condition. Treatment may also include:  Medicines to help you sleep.  Counseling or therapy.  Lifestyle adjustments.  Follow these instructions at home:  Take medicines only as directed by your health care provider.  Keep regular sleeping and waking hours. Avoid naps.  Keep a sleep diary to help you and your health care provider figure out what could be causing your insomnia. Include: ? When you sleep. ? When you wake up during the night. ? How well you sleep. ? How rested you feel the next day. ? Any side effects of medicines you are taking. ? What you eat and drink.  Make your bedroom a comfortable place where it is easy to fall asleep: ? Put up shades or special blackout curtains to block light from outside. ? Use a white noise machine to block noise. ? Keep the temperature cool.  Exercise regularly as directed by your health care provider. Avoid exercising right before bedtime.  Use relaxation techniques to manage stress. Ask your health care provider to suggest some techniques that may work well for you. These may include: ? Breathing  exercises. ? Routines to release muscle tension. ? Visualizing peaceful scenes.  Cut back on alcohol, caffeinated beverages, and cigarettes, especially close to bedtime. These can disrupt your sleep.  Do not overeat or eat spicy foods right before bedtime. This can lead to digestive discomfort that can make it hard for you to sleep.  Limit screen use before bedtime. This includes: ? Watching TV. ? Using your smartphone, tablet, and computer.  Stick to a routine. This can help you fall asleep faster. Try to do a quiet activity, brush your teeth, and go to bed at the same time each night.  Get out of bed if you are still awake after 15 minutes of trying to sleep. Keep the lights down, but try reading or doing a quiet activity. When you feel sleepy, go back to bed.  Make sure  that you drive carefully. Avoid driving if you feel very sleepy.  Keep all follow-up appointments as directed by your health care provider. This is important. Contact a health care provider if:  You are tired throughout the day or have trouble in your daily routine due to sleepiness.  You continue to have sleep problems or your sleep problems get worse. Get help right away if:  You have serious thoughts about hurting yourself or someone else. This information is not intended to replace advice given to you by your health care provider. Make sure you discuss any questions you have with your health care provider. Document Released: 05/23/2000 Document Revised: 10/26/2015 Document Reviewed: 02/24/2014 Elsevier Interactive Patient Education  2018 [employer]  Screening for Sleep Apnea Sleep apnea is a condition in which breathing pauses or becomes shallow during sleep. Sleep apnea screening is a test to determine if you are at risk for sleep apnea. The test is easy and only takes a few minutes. Your health care provider may ask you to have this test in preparation for surgery or as part of a physical exam. What are the symptoms of sleep apnea? Common symptoms of sleep apnea include:  Snoring.  Restless sleep.  Daytime sleepiness.  Pauses in breathing.  Choking during sleep.  Irritability.  Forgetfulness.  Trouble thinking clearly.  Depression.  Personality changes.  Most people with sleep apnea are not aware that they have it. Why should I get screened? Getting screened for sleep apnea can help:  Ensure your safety. It is important for your health care providers to know whether or not you have sleep apnea, especially if you are having surgery or have other long-term (chronic) health conditions.  Improve your health and allow you to get a better night's rest. Restful sleep can help you: ? Have more energy. ? Lose weight. ? Improve high blood pressure. ? Improve diabetes  management. ? Prevent stroke. ? Prevent car accidents.  How is screening done? Screening usually includes being asked a list of questions about your sleep quality. Some questions you may be asked include:  Do you snore?  Is your sleep restless?  Do you have daytime sleepiness?  Has a partner or spouse told you that you stop breathing during sleep?  Have you had trouble concentrating or memory loss?  If your screening test is positive, you are at risk for the condition. Further testing may be needed to confirm a diagnosis of sleep apnea. Where to find more information: You can find screening tools online or at your health care clinic. For more information about sleep apnea screening and healthy sleep, visit these websites:  Centers  for Disease Control and Prevention: DetailSports.is  American Sleep Apnea Association: www.sleepapnea.org  Contact a health care provider if:  You think that you may have sleep apnea. Summary  Sleep apnea screening can help determine if you are at risk for sleep apnea.  It is important for your health care providers to know whether or not you have sleep apnea, especially if you are having surgery or have other chronic health conditions.  You may be asked to take a screening test for sleep apnea in preparation for surgery or as part of a physical exam. This information is not intended to replace advice given to you by your health care provider. Make sure you discuss any questions you have with your health care provider. Document Released: 09/05/2016 Document Revised: 09/05/2016 Document Reviewed: 09/05/2016 Elsevier Interactive Patient Education  2018 [employer].

## 2017-07-24 NOTE — BH Specialist Note (Signed)
Integrated Behavioral Health Initial Visit  MRN: [id number] Name: Diana Joyce  Number of Integrated Behavioral Health Clinician visits:: 1/6 Session Start time: 10:00 AM  Session End time: 10:30 AM Total time: 30 minutes  Type of Service: Integrated Behavioral Health- Individual/Family Interpretor:No. Interpretor Name and Language: NA   Warm Hand Off Completed.       SUBJECTIVE: Diana Joyce is a 27 y.o. female accompanied by self Patient was referred by NP Meredeth IdeFleming for anxiety. Patient reports the following symptoms/concerns: difficulty sleeping, racing thoughts, low energy, and irritability Duration of problem: a while; Severity of problem: moderate  OBJECTIVE: Mood: Anxious and Affect: Appropriate Risk of harm to self or others: No plan to harm self or others  LIFE CONTEXT: Family and Social: Pt receives support from family and girlfriend. Family are not aware of pt's plans to transition from female to female resulting in increase of anxiety  School/Work: Pt is employed as a Special educational needs teachersecurity officer Self-Care: Pt shared substance use hx (marijuana and alcohol) however reports significant decrease in use Life Changes: Pt reports increase in anxiety due to delay in gender transition from female to female. Pt's family are unaware of plans resulting in increase in symptoms  GOALS ADDRESSED: Patient will: 1. Reduce symptoms of: anxiety and depression 2. Increase knowledge and/or ability of: coping skills  3. 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 Pt's family are unaware of plans resulting in increase in symptoms. He reports difficulty sleeping, racing thoughts, low energy, and irritability.    Patient may benefit from psychoeducation, psychotherapy, and medication management. LCSWA educated pt on correlation between one's physical and mental health, in addition, to how stress can negatively impact both. Therapeutic interventions were discussed to promote relaxation/mindfulness. Pt agreed to initiate psychotherapy to address upcoming transition and to process grief. Community resources for psychotherapy, crisis intervention, and medication management were provided.   PLAN: 1. Follow up with behavioral health clinician on : Pt was encouraged to contact LCSWA if symptoms worsen or fail to improve [facility] 2. Behavioral recommendations: LCSWA recommends that pt apply healthy coping skills discussed, initiate psychotherapy, and utilize provided resources.  3. Referral(s): Community Mental Health Services (LME/Outside Clinic) 4. "From scale of 1-10, how likely are you to follow plan?": 10/10  Bridgett LarssonJasmine D Arianis Bowditch, LCSW 07/24/17 5:09 PM

## 2017-08-03 ENCOUNTER — Other Ambulatory Visit: Payer: Self-pay | Admitting: Pharmacist

## 2017-08-03 MED ORDER — PNEUMOCOCCAL VAC POLYVALENT 25 MCG/0.5ML IJ INJ: 0.5000 mL | INJECTION | INTRAMUSCULAR | 0 refills | Status: AC

## 2017-08-03 MED FILL — $PNEUMOVAX 23 VIAL: 25 | 1 days supply | Qty: 1 | Fill #0

## 2017-08-07 ENCOUNTER — Ambulatory Visit: Payer: Self-pay | Attending: Nurse Practitioner | Admitting: Nurse Practitioner

## 2017-08-07 ENCOUNTER — Encounter: Payer: Self-pay | Admitting: Nurse Practitioner

## 2017-08-07 ENCOUNTER — Ambulatory Visit: Payer: Self-pay

## 2017-08-07 VITALS — BP 119/81 | HR 87 | Temp 98.7°F | Ht 65.0 in | Wt 305.6 lb

## 2017-08-07 DIAGNOSIS — Z79899 Other long term (current) drug therapy: Secondary | ICD-10-CM | POA: Insufficient documentation

## 2017-08-07 DIAGNOSIS — E1169 Type 2 diabetes mellitus with other specified complication: Secondary | ICD-10-CM | POA: Insufficient documentation

## 2017-08-07 DIAGNOSIS — G4700 Insomnia, unspecified: Secondary | ICD-10-CM | POA: Insufficient documentation

## 2017-08-07 DIAGNOSIS — J45909 Unspecified asthma, uncomplicated: Secondary | ICD-10-CM | POA: Insufficient documentation

## 2017-08-07 DIAGNOSIS — Z794 Long term (current) use of insulin: Secondary | ICD-10-CM | POA: Insufficient documentation

## 2017-08-07 LAB — GLUCOSE, POCT (MANUAL RESULT ENTRY): POC GLUCOSE: 245 mg/dL — AB (ref 70–99)

## 2017-08-07 LAB — POCT GLYCOSYLATED HEMOGLOBIN (HGB A1C): Hemoglobin A1C: 10.9

## 2017-08-07 MED ORDER — TRAZODONE HCL 150 MG PO TABS: 150.0000 mg | ORAL_TABLET | Freq: Every day | ORAL | 6 refills | Status: DC

## 2017-08-07 NOTE — Patient Instructions (Addendum)
Sleep Apnea Sleep apnea is a condition that affects breathing. People with sleep apnea have moments during sleep when their breathing pauses briefly or gets shallow. Sleep apnea can cause these symptoms:  Trouble staying asleep.  Sleepiness or tiredness during the day.  Irritability.  Loud snoring.  Morning headaches.  Trouble concentrating.  Forgetting things.  Less interest in sex.  Being sleepy for no reason.  Mood swings.  Personality changes.  Depression.  Waking up a lot during the night to pee (urinate).  Dry mouth.  Sore throat.  Follow these instructions at home:  Make any changes in your routine that your doctor recommends.  Eat a healthy, well-balanced diet.  Take over-the-counter and prescription medicines only as told by your doctor.  Avoid using alcohol, calming medicines (sedatives), and narcotic medicines.  Take steps to lose weight if you are overweight.  If you were given a machine (device) to use while you sleep, use it only as told by your doctor.  Do not use any tobacco products, such as cigarettes, chewing tobacco, and e-cigarettes. If you need help quitting, ask your doctor.  Keep all follow-up visits as told by your doctor. This is important. Contact a doctor if:  The machine that you were given to use during sleep is uncomfortable or does not seem to be working.  Your symptoms do not get better.  Your symptoms get worse. Get help right away if:  Your chest hurts.  You have trouble breathing in enough air (shortness of breath).  You have an uncomfortable feeling in your back, arms, or stomach.  You have trouble talking.  One side of your body feels weak.  A part of your face is hanging down (drooping). These symptoms may be an emergency. Do not wait to see if the symptoms will go away. Get medical help right away. Call your local emergency services (911 in the [place] Do not drive yourself to the hospital. This information  is not intended to replace advice given to you by your health care provider. Make sure you discuss any questions you have with your health care provider. Document Released: 03/04/2008 Document Revised: 01/20/2016 Document Reviewed: 03/05/2015 Elsevier Interactive Patient Education  2018 [employer]  Screening for Sleep Apnea Sleep apnea is a condition in which breathing pauses or becomes shallow during sleep. Sleep apnea screening is a test to determine if you are at risk for sleep apnea. The test is easy and only takes a few minutes. Your health care provider may ask you to have this test in preparation for surgery or as part of a physical exam. What are the symptoms of sleep apnea? Common symptoms of sleep apnea include:  Snoring.  Restless sleep.  Daytime sleepiness.  Pauses in breathing.  Choking during sleep.  Irritability.  Forgetfulness.  Trouble thinking clearly.  Depression.  Personality changes.  Most people with sleep apnea are not aware that they have it. Why should I get screened? Getting screened for sleep apnea can help:  Ensure your safety. It is important for your health care providers to know whether or not you have sleep apnea, especially if you are having surgery or have other long-term (chronic) health conditions.  Improve your health and allow you to get a better night's rest. Restful sleep can help you: ? Have more energy. ? Lose weight. ? Improve high blood pressure. ? Improve diabetes management. ? Prevent stroke. ? Prevent car accidents.  How is screening done? Screening usually includes being asked a  list of questions about your sleep quality. Some questions you may be asked include:  Do you snore?  Is your sleep restless?  Do you have daytime sleepiness?  Has a partner or spouse told you that you stop breathing during sleep?  Have you had trouble concentrating or memory loss?  If your screening test is positive, you are at risk  for the condition. Further testing may be needed to confirm a diagnosis of sleep apnea. Where to find more information: You can find screening tools online or at your health care clinic. For more information about sleep apnea screening and healthy sleep, visit these websites:  Centers for Disease Control and Prevention: [employer]ail[employer].is  American Sleep Apnea Association: www.sleepapnea.org  Contact a health care provider if:  You think that you may have sleep apnea. Summary  Sleep apnea screening can help determine if you are at risk for sleep apnea.  It is important for your health care providers to know whether or not you have sleep apnea, especially if you are having surgery or have other chronic health conditions.  You may be asked to take a screening test for sleep apnea in preparation for surgery or as part of a physical exam. This information is not intended to replace advice given to you by your health care provider. Make sure you discuss any questions you have with your health care provider. Document Released: 09/05/2016 Document Revised: 09/05/2016 Document Reviewed: 09/05/2016 Elsevier Interactive Patient Education  2018 [employer]. Sleep Studies A sleep study (polysomnogram) is a series of tests done while you are sleeping. It can show how well you sleep. This can help your health care provider diagnose a sleep disorder and show how severe your sleep disorder is. A sleep study may lead to treatment that will help you sleep better and prevent other medical problems caused by poor sleep. If you have a sleep disorder, you may also be at risk for:  Sleep-related accidents.  High blood pressure.  Heart disease.  Stroke.  Other medical conditions.  Sleep disorders are common. Your health care provider may suspect a sleep disorder if you:  Have loud snoring most nights.  Have brief periods when you stop breathing at night.  Feel sleepy on most  days.  Fall asleep suddenly during the day.  Have trouble falling asleep or staying asleep.  Feel like you need to move your legs when trying to fall asleep.  Have dreams that seem very real shortly after falling asleep.  Feel like you cannot move when you first wake up.  Which tests will I need to have? Most sleep studies last all night and include these tests:  Recordings of your brain activity.  Recordings of your eye movements.  Recording of your heart rate and rhythm.  Blood pressure readings.  Readings of the amount of oxygen in your blood.  Measurements of your chest and belly movement as you breathe during sleep.  If you have signs of the sleep disorder called sleep apnea during your test, you may get a mask to wear for the second half of the night.  The mask provides continuous positive airway pressure (CPAP). This may improve sleep apnea significantly.  You will then have all tests done again with the mask in place to see if your measurements and recordings change.  How are sleep studies done? Most sleep studies are done over one full night of sleep.  You will arrive at the study center in the evening and can go  home in the morning.  Bring your pajamas and toothbrush.  Do not have caffeine on the day of your sleep study.  Your health care provider will let you know if you need to stop taking any of your regular medicines before the test.  To do the tests included in a polysomnogram, you will have:  Round, sticky patches with sensors attached to recording wires (electrodes) placed on your scalp, face, chest, and limbs.  Wires from all the electrodes and sensors run from your bed to a computer. The wires can be taken off and put back on if you need to get out of bed to go to the bathroom.  A sensor placed over your nose to measure airflow.  A finger clip put on one finger to measure your blood oxygen level.  A belt around your belly and a belt around your  chest to measure breathing movements.  Where are sleep studies done? Sleep studies are done at sleep centers. A sleep center may be inside a hospital, office, or clinic. The room where you have the study may look like a hospital room or a hotel room. The health care providers doing the study may come in and out of the room during the study. Most of the time, they will be in another room monitoring your test. How is information from sleep studies helpful? A polysomnogram can be used along with your medical history and a physical exam to diagnose conditions, such as:  Sleep apnea.  Restless legs syndrome.  Sleep-related seizure disorders.  Sleep-related movement disorders.  A medical doctor who specializes in sleep will evaluate your sleep study. The specialist will share the results with your primary health care provider. Treatments based on your sleep study may include:  Improving your sleep habits (sleep hygiene).  Wearing a CPAP mask.  Wearing an oral device at night to improve breathing and reduce snoring.  Taking medicine for: ? Restless legs syndrome. ? Sleep-related seizure disorder. ? Sleep-related movement disorder.  This information is not intended to replace advice given to you by your health care provider. Make sure you discuss any questions you have with your health care provider. Document Released: 11/30/2002 Document Revised: 01/20/2016 Document Reviewed: 08/01/2013 Elsevier Interactive Patient Education  [employer].

## 2017-08-07 NOTE — Progress Notes (Signed)
Assessment & Plan:  Diana Joyce was seen today for follow-up.  Diagnoses and all orders for this visit:  Type 2 diabetes mellitus with other specified complication, with long-term current use of insulin (HCC) -     Glucose (CBG) -     HgB A1c -     Ambulatory referral to Ophthalmology Continue blood sugar control as discussed in office today, low carbohydrate diet, and regular physical exercise as tolerated, 150 minutes per week (30 min each day, 5 days per week, or 50 min 3 days per week).   Insomnia, unspecified type Continue trazodone 151m as prescribed  Patient has been counseled on age-appropriate routine health concerns for screening and prevention. These are reviewed and up-to-date. Referrals have been placed accordingly. Immunizations are up-to-date or declined.    Subjective:   Chief Complaint  Patient presents with  . Follow-up    Patient is here for a follow-up for diabetes.    HPI Diana Haffey89y.o. female presents to office today for follow up to diabetes. She endorses increased stress over the past few weeks. She is switching jobs and recently had to move with short notice given.   Diabetes Mellitus Type 2 She has not been checking her blood sugars as instructed. She is not diet or exercise compliant. She has also missed "a few" doses of lantus due to her work schedule and sleeping habits. She denies any hypo or hyperglycemic symptoms. A1c down from 12.5. Blood pressure is well controlled. Her A1c must be <8 in order for her to start her testosterone treatments for transitioning from female to female. Lab Results  Component Value Date   HGBA1C 10.9 08/07/2017    Insomnia She reports relief of insomnia with taking 1510mof trazodone instead of 10067m  Review of Systems  Constitutional: Negative for fever, malaise/fatigue and weight loss.  HENT: Negative.  Negative for nosebleeds.   Eyes: Negative.  Negative for blurred vision, double vision and photophobia.    Respiratory: Negative.  Negative for cough and shortness of breath.   Cardiovascular: Negative.  Negative for chest pain, palpitations and leg swelling.  Gastrointestinal: Negative.  Negative for heartburn, nausea and vomiting.  Musculoskeletal: Negative.  Negative for myalgias.  Neurological: Negative.  Negative for dizziness, focal weakness, seizures and headaches.  Psychiatric/Behavioral: Negative for suicidal ideas. The patient has insomnia. The patient is not nervous/anxious.     Past Medical History:  Diagnosis Date  . Asthma   . [facility]   Past Surgical History:  Procedure Laterality Date  . FRACTURE SURGERY      Family History  Problem Relation Age of Onset  . Kidney failure Mother   . Hypertension Mother   . Diabetes Mother   . Heart failure Mother   . Diabetes Father     Social History Reviewed with no changes to be made today.   Outpatient Medications Prior to Visit  Medication Sig Dispense Refill  . albuterol (PROVENTIL HFA;VENTOLIN HFA) 108 (90 Base) MCG/ACT inhaler Inhale 2 puffs into the lungs every 6 (six) hours as needed for wheezing or shortness of breath. 1 Inhaler 0  . blood glucose meter kit and supplies KIT Dispense based on patient and insurance preference. Use up to four times daily as directed. (FOR ICD-9 250.00, 250.01). 1 each 0  . Blood Glucose Monitoring Suppl (TRUE METRIX METER) w/Device KIT Check blood sugars fasting and at bedtime daily 1 kit 1  . gabapentin (NEURONTIN) 300 MG capsule Take 1  capsule (300 mg total) by mouth 2 (two) times daily. 60 capsule 1  . glucose blood (TRUE METRIX BLOOD GLUCOSE TEST) test strip Use as instructed 100 each 12  . ibuprofen (ADVIL,MOTRIN) 800 MG tablet Take 1 tablet (800 mg total) by mouth every 6 (six) hours as needed. 60 tablet 1  . insulin glargine (LANTUS) 100 UNIT/ML injection Inject 0.25 mLs (25 Units total) into the skin at bedtime. 10 mL 11  . sitaGLIPtin (JANUVIA) 100 MG tablet Take 1 tablet  (100 mg total) by mouth daily. 30 tablet 3  . traZODone (DESYREL) 100 MG tablet Take 1 tablet (100 mg total) by mouth at bedtime. 30 tablet 3  . TRUEPLUS LANCETS 28G MISC Check blood sugars fasting and at bedtime 100 each 1  . hydrOXYzine (ATARAX/VISTARIL) 50 MG tablet Take 1 tablet (50 mg total) by mouth every 6 (six) hours as needed for anxiety. (Patient not taking: Reported on 08/07/2017) 60 tablet 1  . Insulin Pen Needle (PEN NEEDLES) 32G X 4 MM MISC 1 Dose by Does not apply route daily. (Patient not taking: Reported on 06/19/2017) 100 each 1   No facility-administered medications prior to visit.     Allergies  Allergen Reactions  . Benadryl [Diphenhydramine Hcl] Other (See Comments)    Hives, coughing, itching.        Objective:    BP 119/81 (BP Location: Left Arm, Patient Position: Sitting, Cuff Size: Large)   Pulse 87   Temp 98.7 F (37.1 C) (Oral)   Ht _0  (1.651 m)   Wt (!) 305 lb 9.6 oz (138.6 kg)   SpO2 99%   BMI 50.85 kg/m  Wt Readings from Last 3 Encounters:  08/07/17 (!) 305 lb 9.6 oz (138.6 kg)  07/24/17 (!) 305 lb 6.4 oz (138.5 kg)  06/19/17 (!) 315 lb 9.6 oz (143.2 kg)    Physical Exam  Constitutional: She is oriented to person, place, and time. She appears well-developed and well-nourished. She is cooperative.  HENT:  Head: Normocephalic and atraumatic.  Eyes: EOM are normal.  Neck: Normal range of motion.  Cardiovascular: Normal rate, regular rhythm, normal heart sounds and intact distal pulses. Exam reveals no gallop and no friction rub.  No murmur heard. Pulmonary/Chest: Effort normal and breath sounds normal. No tachypnea. No respiratory distress. She has no decreased breath sounds. She has no wheezes. She has no rhonchi. She has no rales. She exhibits no tenderness.  Abdominal: Soft. Bowel sounds are normal.  Musculoskeletal: Normal range of motion. She exhibits no edema.  Neurological: She is alert and oriented to person, place, and time.  Coordination normal.  Skin: Skin is warm and dry.  Psychiatric: She has a normal mood and affect. Her behavior is normal. Judgment and thought content normal.  Nursing note and vitals reviewed.     Patient has been counseled extensively about nutrition and exercise as well as the importance of adherence with medications and regular follow-up. The patient was given clear instructions to go to ER or return to medical center if symptoms don't improve, worsen or new problems develop. The patient verbalized understanding.   Follow-up: Return in about 3 months (around 11/07/2017) for DM.   Gildardo Pounds, FNP-BC [facility] [place], [place]   08/07/2017, 9:55 AM

## 2017-08-10 ENCOUNTER — Encounter: Payer: Self-pay | Attending: Nurse Practitioner | Admitting: Registered"

## 2017-08-10 DIAGNOSIS — Z713 Dietary counseling and surveillance: Secondary | ICD-10-CM | POA: Insufficient documentation

## 2017-08-10 DIAGNOSIS — E1169 Type 2 diabetes mellitus with other specified complication: Secondary | ICD-10-CM | POA: Insufficient documentation

## 2017-08-10 DIAGNOSIS — E118 Type 2 diabetes mellitus with unspecified complications: Secondary | ICD-10-CM

## 2017-08-10 NOTE — Patient Instructions (Addendum)
Talk to Dr about taking insulin at a more convenient time, 7 am Think about having breakfast food at 2 pm, eggs are a good option Plan on having at least a snack half way through your shift, may help with fatigue. Consider taking vitamin D 1,000 and also ask you doctor to check your vitamin D status with next blood test. Continue having beans Consider getting more veggies in your diet. Keep working on getting good sleep. Fairlife (high protein) or Lactaid should be easier to digest Ideas for other things to drink: chai tea, [employer]-C vitamin packets

## 2017-08-10 NOTE — Progress Notes (Signed)
Diabetes Self-Management Education  Visit Type: Follow-up  Appt. Start Time: 0755 Appt. End Time: 0840  08/10/2017  [name], identified by name and date of birth, is a 27 y.o. female with a diagnosis of Diabetes: Type 2.   ASSESSMENT Per chart patient A1c is 10.9% 08/07/17 down from 12.5% 05/07/17. Patient states she is encouraged by the improvement and has some confidence she can bring it down to 8%. Pt states she has not been check BG because she lost her meter and just found it again yesterday, but states a few weeks ago she was seeing numbers 112, 170 mg/dL. Pt states she will have a food exam in June.   Pt states sometimes it is hard to take insulin at 10 pm while at work (shift is 7pm - 7 am) Pt states in 2 weeks she will be changing jobs and be getting off work at 11 pm. She has already started the new job on her days off. Pt states she also moved into a new home and feels better where she is living now, but still getting used to it.  Pt states motivation for meal prepping, but does not have time during this transition. Patient asked about using nutritional supplements.  Pt states she is not sleep well and her fatigue has increased so her doctor prescribed Trazodone at a dose to provide sedative effect. Pt states she will fall asleep 5-6 min after taking medication. Pt states she will be having the sleep study on Thursday.  Diabetes Self-Management Education - 08/10/17 1000      Visit Information   Visit Type  Follow-up      Initial Visit   Diabetes Type  Type 2    Are you taking your medications as prescribed?  Yes      Complications   Last HgB A1C per patient/outside source  10.9 % 08/07/17      Dietary Intake   Breakfast  none or Sandwich    Lunch  gets off work 2 pm will have ramen noodles OR bologna sandwich on wheat bread OR hotdog    Snack (afternoon)  none    Dinner  none    Snack (evening)  none    Beverage(s)  water      Exercise   Exercise Type  ADL's       Patient Education   Nutrition management   Role of diet in the treatment of diabetes and the relationship between the three main macronutrients and blood glucose level    Chronic complications  Assessed and discussed foot care and prevention of foot problems;Retinopathy and reason for yearly dilated eye exams      Outcomes   Expected Outcomes  Demonstrated interest in learning. Expect positive outcomes    Future DMSE  4-6 wks    Program Status  Not Completed      Subsequent Visit   Since your last visit have you continued or begun to take your medications as prescribed?  Yes sometimes forgets lantus while working    Since your last visit have you experienced any weight changes?  -- weight has stabilized    Since your last visit, are you checking your blood glucose at least once a day?  No lost meter, just found it again yesterday     Individualized Plan for Diabetes Self-Management Training:   Learning Objective:  Patient will have a greater understanding of diabetes self-management. Patient education plan is to attend individual and/or group sessions per assessed  needs and concerns.  Patient Instructions  Talk to Dr about taking insulin at a more convenient time, 7 am Think about having breakfast food at 2 pm, eggs are a good option Plan on having at least a snack half way through your shift, may help with fatigue. Consider taking vitamin D 1,000 and also ask you doctor to check your vitamin D status with next blood test. Continue having beans Consider getting more veggies in your diet. Keep working on getting good sleep. Fairlife (high protein) or Lactaid should be easier to digest Ideas for other things to drink: chai tea, [employer] vitamin packets  Expected Outcomes:  Demonstrated interest in learning. Expect positive outcomes  Education material provided: Sleep Hygiene, My Plate, coupon for Boost,  If problems or questions, patient to contact team via:  Phone and  MyChart  Future DSME appointment: 4-6 wks

## 2017-08-12 ENCOUNTER — Other Ambulatory Visit: Payer: Self-pay

## 2017-08-13 ENCOUNTER — Ambulatory Visit (HOSPITAL_BASED_OUTPATIENT_CLINIC_OR_DEPARTMENT_OTHER): Payer: Self-pay | Attending: Nurse Practitioner | Admitting: Internal Medicine

## 2017-08-13 DIAGNOSIS — E119 Type 2 diabetes mellitus without complications: Secondary | ICD-10-CM | POA: Insufficient documentation

## 2017-08-13 DIAGNOSIS — R0683 Snoring: Secondary | ICD-10-CM | POA: Insufficient documentation

## 2017-08-13 DIAGNOSIS — Z79899 Other long term (current) drug therapy: Secondary | ICD-10-CM | POA: Insufficient documentation

## 2017-08-13 DIAGNOSIS — R5383 Other fatigue: Secondary | ICD-10-CM | POA: Insufficient documentation

## 2017-08-13 DIAGNOSIS — E669 Obesity, unspecified: Secondary | ICD-10-CM | POA: Insufficient documentation

## 2017-08-13 DIAGNOSIS — Z6841 Body Mass Index (BMI) 40.0 and over, adult: Secondary | ICD-10-CM | POA: Insufficient documentation

## 2017-08-13 DIAGNOSIS — Z794 Long term (current) use of insulin: Secondary | ICD-10-CM | POA: Insufficient documentation

## 2017-08-13 DIAGNOSIS — G4733 Obstructive sleep apnea (adult) (pediatric): Secondary | ICD-10-CM | POA: Insufficient documentation

## 2017-08-26 DIAGNOSIS — G4733 Obstructive sleep apnea (adult) (pediatric): Secondary | ICD-10-CM

## 2017-08-26 NOTE — Procedures (Signed)
  Patient Name: Diana Joyce, Diana Joyce Study Date: 08/13/2017 Gender: Female D.O.B: June 19, 1990 Age (years): 26 Referring Provider: Claiborne RiggZelda W Fleming NP Height (inches): 65 Interpreting Physician: Jetty Duhamellinton Young MD, ABSM Weight (lbs): 305 RPSGT: Cherylann ParrDubili, Fred BMI: 51 MRN: [id number] Neck Size: 16.50  CLINICAL INFORMATION Sleep Study Type: NPSG  Indication for sleep study: Daytime Fatigue, Diabetes, Fatigue, Obesity, Snoring, Witnesses Apnea / Gasping During Sleep  Epworth Sleepiness Score: 9  SLEEP STUDY TECHNIQUE As per the AASM Manual for the Scoring of Sleep and Associated Events v2.3 (April 2016) with a hypopnea requiring 4% desaturations.  The channels recorded and monitored were frontal, central and occipital EEG, electrooculogram (EOG), submentalis EMG (chin), nasal and oral airflow, thoracic and abdominal wall motion, anterior tibialis EMG, snore microphone, electrocardiogram, and pulse oximetry.  MEDICATIONS Medications self-administered by patient taken the night of the study : TRAZODONE, GABAPENTIN, HYDROXYZINE, SIITAGLIPIN, INSULIN  SLEEP ARCHITECTURE The study was initiated at 10:04:39 PM and ended at 5:17:07 AM.  Sleep onset time was 18.4 minutes and the sleep efficiency was 94.8%%. The total sleep time was 410.1 minutes.  Stage REM latency was 369.0 minutes.  The patient spent 1.5%% of the night in stage N1 sleep, 61.8%% in stage N2 sleep, 25.7%% in stage N3 and 11.00% in REM.  Alpha intrusion was absent.  Supine sleep was 50.01%.  RESPIRATORY PARAMETERS The overall apnea/hypopnea index (AHI) was 37.9 per hour. There were 113 total apneas, including 113 obstructive, 0 central and 0 mixed apneas. There were 146 hypopneas and 0 RERAs.  The AHI during Stage REM sleep was 109.1 per hour.  AHI while supine was 22.2 per hour.  The mean oxygen saturation was 92.3%. The minimum SpO2 during sleep was 71.0%.  loud snoring was noted during this study.  CARDIAC DATA The 2  lead EKG demonstrated sinus rhythm. The mean heart rate was 85.8 beats per minute. Other EKG findings include: None.  LEG MOVEMENT DATA The total PLMS were 0 with a resulting PLMS index of 0.0. Associated arousal with leg movement index was 0.0 .  IMPRESSIONS - Severe obstructive sleep apnea occurred during this study (AHI = 37.9/h). - No significant central sleep apnea occurred during this study (CAI = 0.0/h). - Moderate oxygen desaturation was noted during this study (Min O2 = 71.0%). - The patient snored with loud snoring volume. - No cardiac abnormalities were noted during this study. - Clinically significant periodic limb movements did not occur during sleep. No significant associated arousals.  DIAGNOSIS - Obstructive Sleep Apnea (327.23 [G47.33 ICD-10])  RECOMMENDATIONS - Recommend CPAP titration study or AutoPAP. Other options would be based on clinical judgment. - Be careful with alcohol, sedatives and other CNS depressants that may worsen sleep apnea and disrupt normal sleep architecture. - Sleep hygiene should be reviewed to assess factors that may improve sleep quality. - Weight management and regular exercise should be initiated or continued if appropriate.  [Electronically signed] 08/26/2017 07:34 AM  Jetty Duhamellinton Young MD, ABSM Diplomate, American Board of Sleep Medicine   NPI: [id number]                           Jetty Duhamellinton Young Diplomate, American Board of Sleep Medicine  ELECTRONICALLY SIGNED ON:  08/26/2017, 7:30 AM [facility] PH: [phone]   FX: [phone] ACCREDITED BY THE AMERICAN ACADEMY OF SLEEP MEDICINE

## 2017-09-09 ENCOUNTER — Telehealth: Payer: Self-pay | Admitting: Nurse Practitioner

## 2017-09-09 NOTE — Telephone Encounter (Signed)
[facility] called and requested to speak with pcp. Pcp was informed and spoke with natalie.

## 2017-09-21 ENCOUNTER — Ambulatory Visit: Payer: Self-pay | Admitting: Registered"

## 2017-10-16 ENCOUNTER — Ambulatory Visit: Payer: Self-pay | Admitting: Family Medicine

## 2017-11-09 ENCOUNTER — Ambulatory Visit: Payer: Self-pay | Admitting: Nurse Practitioner

## 2017-11-21 ENCOUNTER — Encounter (HOSPITAL_BASED_OUTPATIENT_CLINIC_OR_DEPARTMENT_OTHER): Payer: Self-pay | Admitting: *Deleted

## 2017-11-21 ENCOUNTER — Other Ambulatory Visit: Payer: Self-pay

## 2017-11-21 DIAGNOSIS — Z87891 Personal history of nicotine dependence: Secondary | ICD-10-CM | POA: Insufficient documentation

## 2017-11-21 DIAGNOSIS — R739 Hyperglycemia, unspecified: Secondary | ICD-10-CM | POA: Diagnosis present

## 2017-11-21 DIAGNOSIS — E1165 Type 2 diabetes mellitus with hyperglycemia: Secondary | ICD-10-CM | POA: Diagnosis not present

## 2017-11-21 DIAGNOSIS — Z794 Long term (current) use of insulin: Secondary | ICD-10-CM | POA: Diagnosis not present

## 2017-11-21 DIAGNOSIS — Z9114 Patient's other noncompliance with medication regimen: Secondary | ICD-10-CM | POA: Diagnosis not present

## 2017-11-21 DIAGNOSIS — J45909 Unspecified asthma, uncomplicated: Secondary | ICD-10-CM | POA: Insufficient documentation

## 2017-11-21 LAB — CBG MONITORING, ED: Glucose-Capillary: 489 mg/dL — ABNORMAL HIGH (ref 65–99)

## 2017-11-21 NOTE — ED Triage Notes (Signed)
Hyperglycemia states 'high' PTA.  Reports extreme thirst and frequent urination. Yesterday CBG was 409-states that is a normal read for her.  States that she is non-compliant with any medications.

## 2017-11-22 ENCOUNTER — Emergency Department (HOSPITAL_BASED_OUTPATIENT_CLINIC_OR_DEPARTMENT_OTHER)
Admission: EM | Admit: 2017-11-22 | Discharge: 2017-11-22 | Disposition: A | Discharge status: Home / Self Care | Payer: PRIVATE HEALTH INSURANCE | Attending: Emergency Medicine | Admitting: Emergency Medicine

## 2017-11-22 DIAGNOSIS — R739 Hyperglycemia, unspecified: Secondary | ICD-10-CM

## 2017-11-22 DIAGNOSIS — E1169 Type 2 diabetes mellitus with other specified complication: Secondary | ICD-10-CM

## 2017-11-22 DIAGNOSIS — Z794 Long term (current) use of insulin: Secondary | ICD-10-CM

## 2017-11-22 LAB — BASIC METABOLIC PANEL
ANION GAP: 8 (ref 5–15)
BUN: 10 mg/dL (ref 6–20)
CALCIUM: 8.6 mg/dL — AB (ref 8.9–10.3)
CO2: 26 mmol/L (ref 22–32)
Chloride: 98 mmol/L — ABNORMAL LOW (ref 101–111)
Creatinine, Ser: 0.69 mg/dL (ref 0.44–1.00)
GFR calc Af Amer: >60 mL/min (ref >60)
GFR calc non Af Amer: >60 mL/min (ref >60)
GLUCOSE: 414 mg/dL — AB (ref 65–99)
Potassium: 3.8 mmol/L (ref 3.5–5.1)
Sodium: 132 mmol/L — ABNORMAL LOW (ref 135–145)

## 2017-11-22 LAB — CBC WITH DIFFERENTIAL/PLATELET
BASOS ABS: 0 10*3/uL (ref 0.0–0.1)
BASOS PCT: 0 %
EOS ABS: 0.1 10*3/uL (ref 0.0–0.7)
EOS PCT: 1 %
HCT: 36.2 % (ref 36.0–46.0)
Hemoglobin: 12 g/dL (ref 12.0–15.0)
LYMPHS PCT: 39 %
Lymphs Abs: 4.1 10*3/uL — ABNORMAL HIGH (ref 0.7–4.0)
MCH: 23.9 pg — AB (ref 26.0–34.0)
MCHC: 33.1 g/dL (ref 30.0–36.0)
MCV: 72.1 fL — ABNORMAL LOW (ref 78.0–100.0)
Monocytes Absolute: 0.6 10*3/uL (ref 0.1–1.0)
Monocytes Relative: 6 %
Neutro Abs: 5.8 10*3/uL (ref 1.7–7.7)
Neutrophils Relative %: 54 %
Platelets: 353 10*3/uL (ref 150–400)
RBC: 5.02 MIL/uL (ref 3.87–5.11)
RDW: 14.8 % (ref 11.5–15.5)
WBC: 10.6 10*3/uL — AB (ref 4.0–10.5)

## 2017-11-22 LAB — URINALYSIS, MICROSCOPIC (REFLEX)

## 2017-11-22 LAB — CBG MONITORING, ED
GLUCOSE-CAPILLARY: 389 mg/dL — AB (ref 65–99)
Glucose-Capillary: 384 mg/dL — ABNORMAL HIGH (ref 65–99)
Glucose-Capillary: 329 mg/dL — ABNORMAL HIGH (ref 65–99)

## 2017-11-22 LAB — URINALYSIS, ROUTINE W REFLEX MICROSCOPIC
Bilirubin Urine: NEGATIVE
Hgb urine dipstick: NEGATIVE
KETONES UR: NEGATIVE mg/dL
LEUKOCYTES UA: NEGATIVE
Nitrite: NEGATIVE
PH: 6 (ref 5.0–8.0)
Protein, ur: NEGATIVE mg/dL
Specific Gravity, Urine: 1.01 (ref 1.005–1.030)

## 2017-11-22 LAB — PREGNANCY, URINE: Preg Test, Ur: NEGATIVE

## 2017-11-22 MED ORDER — INSULIN REGULAR HUMAN 100 UNIT/ML IJ SOLN
8.0000 [IU] | Freq: Once | INTRAMUSCULAR | Status: AC
  Administered 2017-11-22: 8 [IU] via SUBCUTANEOUS
  Filled 2017-11-22: qty 1

## 2017-11-22 MED ORDER — INSULIN GLARGINE 100 UNIT/ML ~~LOC~~ SOLN: 25.0000 [IU] | Freq: Every day | SUBCUTANEOUS | 0 refills | Status: DC

## 2017-11-22 MED ORDER — SODIUM CHLORIDE 0.9 % IV BOLUS
1000.0000 mL | Freq: Once | INTRAVENOUS | Status: AC
Start: 2017-11-22 — End: 2017-11-22
  Administered 2017-11-22: 1000 mL via INTRAVENOUS

## 2017-11-22 MED ORDER — SITAGLIPTIN PHOSPHATE 100 MG PO TABS: 100.0000 mg | ORAL_TABLET | Freq: Every day | ORAL | 0 refills | Status: DC

## 2017-11-22 NOTE — ED Notes (Signed)
Alert, jovial, NAD, calm, interactive, resps e/u, speaking in clear complete sentences, no dyspnea noted, skin W&D, (denies: pain, sob, NVD, or fever). [facility]

## 2017-11-22 NOTE — Discharge Instructions (Signed)
Take your diabetic medications as prescribed.  Check your blood sugar as prescribed.  Follow-up with your doctor.  Follow-up with hematologist regarding your abnormal blood smear.  Return to the ED if you develop new or worsening symptoms.

## 2017-11-22 NOTE — ED Provider Notes (Signed)
[facility] EMERGENCY DEPARTMENT Provider Note   CSN: [id number] Arrival date & time: 11/21/17  2346     History   Chief Complaint Chief Complaint  Patient presents with  . Hyperglycemia    HPI Diana Joyce is a 27 y.o. female.  Patient is a noncompliant diabetic presenting with hyperglycemia.  She states she checked her blood sugar tonight at the insistence of her family and was over 600.  She took her Januvia and Lantus yesterday but does not member the time she took it before that.  She states she does not take it because her supply is low and she has not been able to get it refilled.  She has had some polyuria and polydipsia.  No fever, chills, nausea, vomiting, abdominal pain, chest pain or shortness of breath.  She denies feeling ill.  The history is provided by the patient.  Hyperglycemia  Associated symptoms: no abdominal pain, no chest pain, no dizziness, no fatigue, no fever, no nausea, no shortness of breath, no vomiting and no weakness     Past Medical History:  Diagnosis Date  . Asthma   . Diabetes [facility])     Patient Active Problem List   Diagnosis Date Noted  . Class 2 severe obesity with serious comorbidity in adult ([place]) 06/19/2017  . Diabetes [facility])     Past Surgical History:  Procedure Laterality Date  . FRACTURE SURGERY       OB History   None      Home Medications    Prior to Admission medications   Medication Sig Start Date End Date Taking? Authorizing Provider  albuterol (PROVENTIL HFA;VENTOLIN HFA) 108 (90 Base) MCG/ACT inhaler Inhale 2 puffs into the lungs every 6 (six) hours as needed for wheezing or shortness of breath. 07/24/17   Gildardo Pounds, NP  blood glucose meter kit and supplies KIT Dispense based on patient and insurance preference. Use up to four times daily as directed. (FOR ICD-9 250.00, 250.01). 11/19/16   Duffy Bruce, MD  Blood Glucose Monitoring Suppl (TRUE METRIX METER) w/Device KIT Check blood sugars fasting  and at bedtime daily 05/07/17   Argentina Donovan, PA-C  gabapentin (NEURONTIN) 300 MG capsule Take 1 capsule (300 mg total) by mouth 2 (two) times daily. 06/19/17   Gildardo Pounds, NP  glucose blood (TRUE METRIX BLOOD GLUCOSE TEST) test strip Use as instructed 05/07/17   Argentina Donovan, PA-C  insulin glargine (LANTUS) 100 UNIT/ML injection Inject 0.25 mLs (25 Units total) into the skin at bedtime. 07/24/17 08/23/17  Gildardo Pounds, NP  Insulin Pen Needle (PEN NEEDLES) 32G X 4 MM MISC 1 Dose by Does not apply route daily. Patient not taking: Reported on 06/19/2017 05/07/17   Argentina Donovan, PA-C  sitaGLIPtin (JANUVIA) 100 MG tablet Take 1 tablet (100 mg total) by mouth daily. 07/24/17   Gildardo Pounds, NP  traZODone (DESYREL) 150 MG tablet Take 1 tablet (150 mg total) by mouth at bedtime. 08/07/17   Gildardo Pounds, NP  TRUEPLUS LANCETS 28G MISC Check blood sugars fasting and at bedtime 05/07/17   Argentina Donovan, PA-C    Family History Family History  Problem Relation Age of Onset  . Kidney failure Mother   . Hypertension Mother   . Diabetes Mother   . Heart failure Mother   . Diabetes Father     Social History Social History   Tobacco Use  . Smoking status: Former Smoker    Types:  Cigars  . Smokeless tobacco: Never Used  Substance Use Topics  . Alcohol use: No  . Drug use: No     Allergies   Benadryl [diphenhydramine hcl]   Review of Systems Review of Systems  Constitutional: Negative for activity change, appetite change, fatigue and fever.  HENT: Negative for congestion.   Eyes: Negative for visual disturbance.  Respiratory: Negative for cough, chest tightness and shortness of breath.   Cardiovascular: Negative for chest pain.  Gastrointestinal: Negative for abdominal pain, nausea and vomiting.  Genitourinary: Positive for frequency.  Musculoskeletal: Negative for back pain and myalgias.  Neurological: Negative for dizziness, weakness and headaches.    all  other systems are negative except as noted in the HPI and PMH.    Physical Exam Updated Vital Signs BP 106/85 (BP Location: Left Arm)   Pulse 93   Temp 97.9 F (36.6 C) (Oral)   Resp 20   Ht 5' 5"  (1.651 m)   Wt 136.1 kg (300 lb)   SpO2 100%   BMI 49.92 kg/m   Physical Exam  Constitutional: She is oriented to person, place, and time. She appears well-developed and well-nourished. No distress.  HENT:  Head: Normocephalic and atraumatic.  Mouth/Throat: Oropharynx is clear and moist. No oropharyngeal exudate.  Eyes: Pupils are equal, round, and reactive to light. Conjunctivae and EOM are normal.  Neck: Normal range of motion. Neck supple.  No meningismus.  Cardiovascular: Normal rate, regular rhythm, normal heart sounds and intact distal pulses.  No murmur heard. Pulmonary/Chest: Effort normal and breath sounds normal. No respiratory distress.  Abdominal: Soft. There is no tenderness. There is no rebound and no guarding.  Musculoskeletal: Normal range of motion. She exhibits no edema or tenderness.  Neurological: She is alert and oriented to person, place, and time. No cranial nerve deficit. She exhibits normal muscle tone. Coordination normal.  No ataxia on finger to nose bilaterally. No pronator drift. 5/5 strength throughout. CN 2-12 intact.Equal grip strength. Sensation intact.   Skin: Skin is warm.  Psychiatric: She has a normal mood and affect. Her behavior is normal.  Nursing note and vitals reviewed.    ED Treatments / Results  Labs (all labs ordered are listed, but only abnormal results are displayed) Labs Reviewed  BASIC METABOLIC PANEL - Abnormal; Notable for the following components:      Result Value   Sodium 132 (*)    Chloride 98 (*)    Glucose, Bld 414 (*)    Calcium 8.6 (*)    All other components within normal limits  URINALYSIS, ROUTINE W REFLEX MICROSCOPIC - Abnormal; Notable for the following components:   Glucose, UA >=500 (*)    All other  components within normal limits  URINALYSIS, MICROSCOPIC (REFLEX) - Abnormal; Notable for the following components:   Bacteria, UA RARE (*)    All other components within normal limits  CBC WITH DIFFERENTIAL/PLATELET - Abnormal; Notable for the following components:   WBC 10.6 (*)    MCV 72.1 (*)    MCH 23.9 (*)    Lymphs Abs 4.1 (*)    All other components within normal limits  CBG MONITORING, ED - Abnormal; Notable for the following components:   Glucose-Capillary 489 (*)    All other components within normal limits  CBG MONITORING, ED - Abnormal; Notable for the following components:   Glucose-Capillary 384 (*)    All other components within normal limits  CBG MONITORING, ED - Abnormal; Notable for the following components:  Glucose-Capillary 389 (*)    All other components within normal limits  CBG MONITORING, ED - Abnormal; Notable for the following components:   Glucose-Capillary 329 (*)    All other components within normal limits  PREGNANCY, URINE    EKG None  Radiology No results found.  Procedures Procedures (including critical care time)  Medications Ordered in ED Medications  sodium chloride 0.9 % bolus 1,000 mL (0 mLs Intravenous Stopped 11/22/17 0248)     Initial Impression / Assessment and Plan / ED Course  I have reviewed the triage vital signs and the nursing notes.  Pertinent labs & imaging results that were available during my care of the patient were reviewed by me and considered in my medical decision making (see chart for details).    Noncompliant diabetic presenting with hyperglycemia.  No fever or infectious symptoms. Blood sugar 384. Urinalysis without ketones.  Patient will be hydrated.  Labs with hyperglycemia.  Anion gap is normal.  No evidence of DKA.  Blood sugar has improved with IV fluids and insulin. Suspect etiology of hyperglycemia is due to noncompliance.  No evidence of infection.  Incidental finding of teardrop cells and  stomatocytes on CBC.  Hemoglobin is stable.  Discussed with Dr. Julien Nordmann of hematology who states with normal CBC count this can be followed as an outpatient.  No additional blood work needed. Does not recommend LFTs.  Doubt patient is having hemolysis at this time.  Patient's diabetic medications are refilled.  Discussed compliance.  Follow-up with PCP.  Return precautions discussed.    Final Clinical Impressions(s) / ED Diagnoses   Final diagnoses:  Hyperglycemia    ED Discharge Orders    None       Mekiah Cambridge, Annie Main, MD 11/22/17 [phone]

## 2017-11-22 NOTE — ED Notes (Signed)
EDP into room, prior to RN assessment, see MD notes, pending orders.   

## 2017-12-07 ENCOUNTER — Ambulatory Visit: Payer: PRIVATE HEALTH INSURANCE | Attending: Family Medicine | Admitting: Family Medicine

## 2017-12-07 ENCOUNTER — Encounter: Payer: Self-pay | Admitting: Family Medicine

## 2017-12-07 VITALS — BP 154/66 | HR 95 | Temp 98.1°F | Wt 304.0 lb

## 2017-12-07 DIAGNOSIS — Z9114 Patient's other noncompliance with medication regimen: Secondary | ICD-10-CM | POA: Insufficient documentation

## 2017-12-07 DIAGNOSIS — N920 Excessive and frequent menstruation with regular cycle: Secondary | ICD-10-CM | POA: Insufficient documentation

## 2017-12-07 DIAGNOSIS — Z6841 Body Mass Index (BMI) 40.0 and over, adult: Secondary | ICD-10-CM | POA: Insufficient documentation

## 2017-12-07 DIAGNOSIS — Z794 Long term (current) use of insulin: Secondary | ICD-10-CM | POA: Insufficient documentation

## 2017-12-07 DIAGNOSIS — Z888 Allergy status to other drugs, medicaments and biological substances status: Secondary | ICD-10-CM | POA: Insufficient documentation

## 2017-12-07 DIAGNOSIS — J45909 Unspecified asthma, uncomplicated: Secondary | ICD-10-CM | POA: Insufficient documentation

## 2017-12-07 DIAGNOSIS — G4730 Sleep apnea, unspecified: Secondary | ICD-10-CM | POA: Insufficient documentation

## 2017-12-07 DIAGNOSIS — Z9119 Patient's noncompliance with other medical treatment and regimen: Secondary | ICD-10-CM | POA: Insufficient documentation

## 2017-12-07 DIAGNOSIS — Z79899 Other long term (current) drug therapy: Secondary | ICD-10-CM | POA: Insufficient documentation

## 2017-12-07 DIAGNOSIS — E1169 Type 2 diabetes mellitus with other specified complication: Secondary | ICD-10-CM | POA: Diagnosis not present

## 2017-12-07 DIAGNOSIS — G4733 Obstructive sleep apnea (adult) (pediatric): Secondary | ICD-10-CM | POA: Diagnosis not present

## 2017-12-07 LAB — POCT GLYCOSYLATED HEMOGLOBIN (HGB A1C): HbA1c, POC (controlled diabetic range): 13.5 % — AB (ref 0.0–7.0)

## 2017-12-07 LAB — GLUCOSE, POCT (MANUAL RESULT ENTRY)
POC GLUCOSE: 448 mg/dL — AB (ref 70–99)
POC Glucose: 395 mg/dl — AB (ref 70–99)

## 2017-12-07 MED ORDER — ALBUTEROL SULFATE HFA 108 (90 BASE) MCG/ACT IN AERS: 2.0000 | INHALATION_SPRAY | Freq: Four times a day (QID) | RESPIRATORY_TRACT | 0 refills | Status: DC | PRN

## 2017-12-07 MED ORDER — INSULIN GLARGINE 100 UNIT/ML ~~LOC~~ SOLN: 25.0000 [IU] | Freq: Every day | SUBCUTANEOUS | 3 refills | Status: DC

## 2017-12-07 MED ORDER — GABAPENTIN 300 MG PO CAPS: 300.0000 mg | ORAL_CAPSULE | Freq: Two times a day (BID) | ORAL | 3 refills | Status: DC

## 2017-12-07 MED ORDER — BLOOD GLUCOSE MONITOR KIT: PACK | 12 refills | Status: AC

## 2017-12-07 MED ORDER — GLIPIZIDE 5 MG PO TABS: 5.0000 mg | ORAL_TABLET | Freq: Two times a day (BID) | ORAL | 3 refills | Status: DC

## 2017-12-07 MED ORDER — INSULIN ASPART 100 UNIT/ML ~~LOC~~ SOLN
10.0000 [IU] | Freq: Once | SUBCUTANEOUS | Status: AC
  Administered 2017-12-07: 10 [IU] via SUBCUTANEOUS

## 2017-12-07 MED FILL — TRUE METRIX TEST STRIP: 25 days supply | Qty: 100 | Fill #0

## 2017-12-07 MED FILL — $VENTOLIN HFA 18G INHALER: 108 (90 BAS | 25 days supply | Qty: 18 | Fill #0

## 2017-12-07 MED FILL — !TRUE METRIX BLOOD GLUCOSE: 30 days supply | Qty: 1 | Fill #0

## 2017-12-07 MED FILL — $LANTUS 100 UNITS/ML VIAL: 100 | 28 days supply | Qty: 10 | Fill #0

## 2017-12-07 MED FILL — TRUEplus LANCETS 28G MISC: 25 days supply | Qty: 100 | Fill #0

## 2017-12-07 NOTE — Patient Instructions (Signed)
Diabetes Mellitus and Nutrition When you have diabetes (diabetes mellitus), it is very important to have healthy eating habits because your blood sugar (glucose) levels are greatly affected by what you eat and drink. Eating healthy foods in the appropriate amounts, at about the same times every day, can help you:  Control your blood glucose.  Lower your risk of heart disease.  Improve your blood pressure.  Reach or maintain a healthy weight.  Every person with diabetes is different, and each person has different needs for a meal plan. Your health care provider may recommend that you work with a diet and nutrition specialist (dietitian) to make a meal plan that is best for you. Your meal plan may vary depending on factors such as:  The calories you need.  The medicines you take.  Your weight.  Your blood glucose, blood pressure, and cholesterol levels.  Your activity level.  Other health conditions you have, such as heart or kidney disease.  How do carbohydrates affect me? Carbohydrates affect your blood glucose level more than any other type of food. Eating carbohydrates naturally increases the amount of glucose in your blood. Carbohydrate counting is a method for keeping track of how many carbohydrates you eat. Counting carbohydrates is important to keep your blood glucose at a healthy level, especially if you use insulin or take certain oral diabetes medicines. It is important to know how many carbohydrates you can safely have in each meal. This is different for every person. Your dietitian can help you calculate how many carbohydrates you should have at each meal and for snack. Foods that contain carbohydrates include:  Bread, cereal, rice, pasta, and crackers.  Potatoes and corn.  Peas, beans, and lentils.  Milk and yogurt.  Fruit and juice.  Desserts, such as cakes, cookies, ice cream, and candy.  How does alcohol affect me? Alcohol can cause a sudden decrease in blood  glucose (hypoglycemia), especially if you use insulin or take certain oral diabetes medicines. Hypoglycemia can be a life-threatening condition. Symptoms of hypoglycemia (sleepiness, dizziness, and confusion) are similar to symptoms of having too much alcohol. If your health care provider says that alcohol is safe for you, follow these guidelines:  Limit alcohol intake to no more than 1 drink per day for nonpregnant women and 2 drinks per day for men. One drink equals 12 oz of beer, 5 oz of wine, or 1 oz of hard liquor.  Do not drink on an empty stomach.  Keep yourself hydrated with water, diet soda, or unsweetened iced tea.  Keep in mind that regular soda, juice, and other mixers may contain a lot of sugar and must be counted as carbohydrates.  What are tips for following this plan? Reading food labels  Start by checking the serving size on the label. The amount of calories, carbohydrates, fats, and other nutrients listed on the label are based on one serving of the food. Many foods contain more than one serving per package.  Check the total grams (g) of carbohydrates in one serving. You can calculate the number of servings of carbohydrates in one serving by dividing the total carbohydrates by 15. For example, if a food has 30 g of total carbohydrates, it would be equal to 2 servings of carbohydrates.  Check the number of grams (g) of saturated and trans fats in one serving. Choose foods that have low or no amount of these fats.  Check the number of milligrams (mg) of sodium in one serving. Most people   should limit total sodium intake to less than 2,300 mg per day.  Always check the nutrition information of foods labeled as "low-fat" or "nonfat". These foods may be higher in added sugar or refined carbohydrates and should be avoided.  Talk to your dietitian to identify your daily goals for nutrients listed on the label. Shopping  Avoid buying canned, premade, or processed foods. These  foods tend to be high in fat, sodium, and added sugar.  Shop around the outside edge of the grocery store. This includes fresh fruits and vegetables, bulk grains, fresh meats, and fresh dairy. Cooking  Use low-heat cooking methods, such as baking, instead of high-heat cooking methods like deep frying.  Cook using healthy oils, such as olive, canola, or sunflower oil.  Avoid cooking with butter, cream, or high-fat meats. Meal planning  Eat meals and snacks regularly, preferably at the same times every day. Avoid going long periods of time without eating.  Eat foods high in fiber, such as fresh fruits, vegetables, beans, and whole grains. Talk to your dietitian about how many servings of carbohydrates you can eat at each meal.  Eat 4-6 ounces of lean protein each day, such as lean meat, chicken, fish, eggs, or tofu. 1 ounce is equal to 1 ounce of meat, chicken, or fish, 1 egg, or 1/4 cup of tofu.  Eat some foods each day that contain healthy fats, such as avocado, nuts, seeds, and fish. Lifestyle   Check your blood glucose regularly.  Exercise at least 30 minutes 5 or more days each week, or as told by your health care provider.  Take medicines as told by your health care provider.  Do not use any products that contain nicotine or tobacco, such as cigarettes and e-cigarettes. If you need help quitting, ask your health care provider.  Work with a counselor or diabetes educator to identify strategies to manage stress and any emotional and social challenges. What are some questions to ask my health care provider?  Do I need to meet with a diabetes educator?  Do I need to meet with a dietitian?  What number can I call if I have questions?  When are the best times to check my blood glucose? Where to find more information:  American Diabetes Association: diabetes.org/food-and-fitness/food  Academy of Nutrition and Dietetics:  www.eatright.org/resources/health/diseases-and-conditions/diabetes  National Institute of Diabetes and Digestive and Kidney Diseases (NIH): www.niddk.nih.gov/health-information/diabetes/overview/diet-eating-physical-activity Summary  A healthy meal plan will help you control your blood glucose and maintain a healthy lifestyle.  Working with a diet and nutrition specialist (dietitian) can help you make a meal plan that is best for you.  Keep in mind that carbohydrates and alcohol have immediate effects on your blood glucose levels. It is important to count carbohydrates and to use alcohol carefully. This information is not intended to replace advice given to you by your health care provider. Make sure you discuss any questions you have with your health care provider. Document Released: 02/20/2005 Document Revised: 06/30/2016 Document Reviewed: 06/30/2016 Elsevier Interactive Patient Education  2018 [employer]  

## 2017-12-07 NOTE — Progress Notes (Signed)
Subjective:  Patient ID: Diana Joyce, female    DOB: 17-Jun-1990  Age: 27 y.o. MRN: [id number]  CC: Diabetes   HPI Diana Joyce is a 27 year old female with a history of type 2 diabetes mellitus (A1c 13.5 ), morbid obesity, asthma who presents today to establish care with me. Her A1c is 13.5 and has trended up from 10.9 previously and she endorses not been compliant with medications as she had no Lantus and previously had no insurance but has now obtained insurance through her current job. She has also not been compliant with a diabetic diet.  She was diagnosed with type 2 diabetes 1 year ago. 4 months ago she underwent a sleep study which revealed obstructive sleep apnea however she complains she never got the results of her tests and continues to snore at night and wakes up with paroxysmal nocturnal dyspnea and wakes up choking. She is concerned about the abnormal red blood cells which were noted on her blood work at the emergency room where she was treated for hyperglycemia 2 weeks ago.  Review of her labs indicate presence of stomatocytes. She endorses menorrhagia.  Some passage of clots and we have discussed the role of oral contraceptive pills and management of this.  She is undecided at this time.  Past Medical History:  Diagnosis Date  . Asthma   . Diabetes [facility])     Past Surgical History:  Procedure Laterality Date  . FRACTURE SURGERY      Allergies  Allergen Reactions  . Benadryl [Diphenhydramine Hcl] Other (See Comments)    Hives, coughing, itching.      Outpatient Medications Prior to Visit  Medication Sig Dispense Refill  . Blood Glucose Monitoring Suppl (TRUE METRIX METER) w/Device KIT Check blood sugars fasting and at bedtime daily (Patient not taking: Reported on 12/07/2017) 1 kit 1  . glucose blood (TRUE METRIX BLOOD GLUCOSE TEST) test strip Use as instructed (Patient not taking: Reported on 12/07/2017) 100 each 12  . Insulin Pen Needle (PEN NEEDLES) 32G X 4 MM  MISC 1 Dose by Does not apply route daily. (Patient not taking: Reported on 06/19/2017) 100 each 1  . TRUEPLUS LANCETS 28G MISC Check blood sugars fasting and at bedtime (Patient not taking: Reported on 12/07/2017) 100 each 1  . albuterol (PROVENTIL HFA;VENTOLIN HFA) 108 (90 Base) MCG/ACT inhaler Inhale 2 puffs into the lungs every 6 (six) hours as needed for wheezing or shortness of breath. (Patient not taking: Reported on 12/07/2017) 1 Inhaler 0  . blood glucose meter kit and supplies KIT Dispense based on patient and insurance preference. Use up to four times daily as directed. (FOR ICD-9 250.00, 250.01). (Patient not taking: Reported on 12/07/2017) 1 each 0  . gabapentin (NEURONTIN) 300 MG capsule Take 1 capsule (300 mg total) by mouth 2 (two) times daily. (Patient not taking: Reported on 12/07/2017) 60 capsule 1  . insulin glargine (LANTUS) 100 UNIT/ML injection Inject 0.25 mLs (25 Units total) into the skin at bedtime. (Patient not taking: Reported on 12/07/2017) 10 mL 0  . sitaGLIPtin (JANUVIA) 100 MG tablet Take 1 tablet (100 mg total) by mouth daily. (Patient not taking: Reported on 12/07/2017) 30 tablet 0  . traZODone (DESYREL) 150 MG tablet Take 1 tablet (150 mg total) by mouth at bedtime. (Patient not taking: Reported on 12/07/2017) 30 tablet 6   No facility-administered medications prior to visit.     ROS Review of Systems  Constitutional: Negative for activity change, appetite change and fatigue.  HENT: Negative for congestion, sinus pressure and sore throat.   Eyes: Negative for visual disturbance.  Respiratory: Negative for cough, chest tightness, shortness of breath and wheezing.   Cardiovascular: Negative for chest pain and palpitations.  Gastrointestinal: Negative for abdominal distention, abdominal pain and constipation.  Endocrine: Negative for polydipsia.  Genitourinary: Negative for dysuria and frequency.  Musculoskeletal: Negative for arthralgias and back pain.  Skin: Negative for  rash.  Neurological: Negative for tremors, light-headedness and numbness.  Hematological: Does not bruise/bleed easily.  Psychiatric/Behavioral: Negative for agitation and behavioral problems.    Objective:  BP (!) 154/66   Pulse 95   Temp 98.1 F (36.7 C) (Oral)   Wt (!) 304 lb (137.9 kg)   SpO2 97%   BMI 50.59 kg/m   BP/Weight 12/07/2017 11/22/2017 2/83/1517  Systolic BP 616 073 -  Diastolic BP 66 57 -  Wt. (Lbs) 304 - 300  BMI 50.59 49.92 -      Physical Exam  Constitutional: She is oriented to person, place, and time. She appears well-developed and well-nourished.  Morbidly obese  Cardiovascular: Normal rate, normal heart sounds and intact distal pulses.  No murmur heard. Pulmonary/Chest: Effort normal and breath sounds normal. She has no wheezes. She has no rales. She exhibits no tenderness.  Abdominal: Soft. Bowel sounds are normal. She exhibits no distension and no mass. There is no tenderness.  Musculoskeletal: Normal range of motion.  Neurological: She is alert and oriented to person, place, and time.  Skin: Skin is warm and dry.  Psychiatric: She has a normal mood and affect.    Lab Results  Component Value Date   HGBA1C 13.5 (A) 12/07/2017    Assessment & Plan:   1. Type 2 diabetes mellitus with other specified complication, with long-term current use of insulin (HCC) Uncontrolled with A1c of 13.5 due to noncompliance Refilled Lantus Would like to place on Victoza due to weight loss benefit and cardiovascular benefit however this is not covered by her insurance as well as several other non-generic medication. Unable to tolerate metformin due to GI side effects Placed on glipizide Diabetic diet, lifestyle modifications - POCT glucose (manual entry) - POCT glycosylated hemoglobin (Hb A1C) - Glutamic acid decarboxylase auto abs - Insulin and C-Peptide - gabapentin (NEURONTIN) 300 MG capsule; Take 1 capsule (300 mg total) by mouth 2 (two) times daily.   Dispense: 60 capsule; Refill: 3 - insulin glargine (LANTUS) 100 UNIT/ML injection; Inject 0.25 mLs (25 Units total) into the skin at bedtime.  Dispense: 30 mL; Refill: 3 - glipiZIDE (GLUCOTROL) 5 MG tablet; Take 1 tablet (5 mg total) by mouth 2 (two) times daily before a meal.  Dispense: 60 tablet; Refill: 3 - blood glucose meter kit and supplies KIT; Dispense with test strips and lancets based on insurance preference. Use up to four times daily as directed. (FOR ICD-9 250.00, 250.01).  Dispense: 100 each; Refill: 12  2. Uncomplicated asthma, unspecified asthma severity, unspecified whether persistent Stable No flares - albuterol (PROVENTIL HFA;VENTOLIN HFA) 108 (90 Base) MCG/ACT inhaler; Inhale 2 puffs into the lungs every 6 (six) hours as needed for wheezing or shortness of breath.  Dispense: 1 Inhaler; Refill: 0  3. Class 3 severe obesity due to excess calories without serious comorbidity with body mass index (BMI) of 50.0 to 59.9 in adult [facility]) Reduce portion sizes, increase physical activity  4.  Obstructive sleep apnea CPAP titration ordered  She does have an elevated blood pressure; will pursue lifestyle modifications and reassess  at next visit.  Regarding stomatocytes, this could be due to hemolysis vs hereditary.  Will monitor and refer to hematology if indicated.  Meds ordered this encounter  Medications  . albuterol (PROVENTIL HFA;VENTOLIN HFA) 108 (90 Base) MCG/ACT inhaler    Sig: Inhale 2 puffs into the lungs every 6 (six) hours as needed for wheezing or shortness of breath.    Dispense:  1 Inhaler    Refill:  0  . gabapentin (NEURONTIN) 300 MG capsule    Sig: Take 1 capsule (300 mg total) by mouth 2 (two) times daily.    Dispense:  60 capsule    Refill:  3  . insulin glargine (LANTUS) 100 UNIT/ML injection    Sig: Inject 0.25 mLs (25 Units total) into the skin at bedtime.    Dispense:  30 mL    Refill:  3  . glipiZIDE (GLUCOTROL) 5 MG tablet    Sig: Take 1 tablet (5 mg  total) by mouth 2 (two) times daily before a meal.    Dispense:  60 tablet    Refill:  3  . blood glucose meter kit and supplies KIT    Sig: Dispense with test strips and lancets based on insurance preference. Use up to four times daily as directed. (FOR ICD-9 250.00, 250.01).    Dispense:  100 each    Refill:  12    Order Specific Question:   Number of strips    Answer:   51    Order Specific Question:   Number of lancets    Answer:   90    Follow-up: Return in about 3 months (around 03/09/2018) for Follow-up on diabetes.   Charlott Rakes MD

## 2017-12-09 LAB — GLUTAMIC ACID DECARBOXYLASE AUTO ABS: Glutamic Acid Decarb Ab: <5 U/mL (ref 0.0–5.0)

## 2017-12-09 LAB — INSULIN AND C-PEPTIDE, SERUM
C-Peptide: 5 ng/mL — ABNORMAL HIGH (ref 1.1–4.4)
INSULIN: 43.1 u[IU]/mL — AB (ref 2.6–24.9)

## 2017-12-26 ENCOUNTER — Emergency Department (HOSPITAL_COMMUNITY)
Admission: EM | Admit: 2017-12-26 | Discharge: 2017-12-26 | Disposition: A | Discharge status: Home / Self Care | Payer: PRIVATE HEALTH INSURANCE | Attending: Emergency Medicine | Admitting: Emergency Medicine

## 2017-12-26 ENCOUNTER — Encounter (HOSPITAL_COMMUNITY): Payer: Self-pay | Admitting: Emergency Medicine

## 2017-12-26 ENCOUNTER — Other Ambulatory Visit: Payer: Self-pay

## 2017-12-26 DIAGNOSIS — Y999 Unspecified external cause status: Secondary | ICD-10-CM | POA: Insufficient documentation

## 2017-12-26 DIAGNOSIS — S0990XA Unspecified injury of head, initial encounter: Secondary | ICD-10-CM | POA: Diagnosis not present

## 2017-12-26 DIAGNOSIS — Z794 Long term (current) use of insulin: Secondary | ICD-10-CM | POA: Insufficient documentation

## 2017-12-26 DIAGNOSIS — Z87891 Personal history of nicotine dependence: Secondary | ICD-10-CM | POA: Insufficient documentation

## 2017-12-26 DIAGNOSIS — Y939 Activity, unspecified: Secondary | ICD-10-CM | POA: Insufficient documentation

## 2017-12-26 DIAGNOSIS — E119 Type 2 diabetes mellitus without complications: Secondary | ICD-10-CM | POA: Diagnosis not present

## 2017-12-26 DIAGNOSIS — S0083XA Contusion of other part of head, initial encounter: Secondary | ICD-10-CM | POA: Diagnosis present

## 2017-12-26 DIAGNOSIS — Y929 Unspecified place or not applicable: Secondary | ICD-10-CM | POA: Insufficient documentation

## 2017-12-26 HISTORY — DX: Sleep apnea, unspecified: G47.30

## 2017-12-26 LAB — CBG MONITORING, ED: GLUCOSE-CAPILLARY: 332 mg/dL — AB (ref 70–99)
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
## 2017-12-26 NOTE — ED Notes (Signed)
CBG 332 
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
## 2018-01-08 ENCOUNTER — Ambulatory Visit (HOSPITAL_BASED_OUTPATIENT_CLINIC_OR_DEPARTMENT_OTHER): Payer: PRIVATE HEALTH INSURANCE | Attending: Family Medicine | Admitting: Internal Medicine

## 2018-01-08 VITALS — Ht 65.0 in | Wt 300.0 lb

## 2018-01-08 DIAGNOSIS — G4733 Obstructive sleep apnea (adult) (pediatric): Secondary | ICD-10-CM | POA: Diagnosis not present

## 2018-01-08 DIAGNOSIS — G4730 Sleep apnea, unspecified: Secondary | ICD-10-CM

## 2018-01-28 DIAGNOSIS — G4733 Obstructive sleep apnea (adult) (pediatric): Secondary | ICD-10-CM

## 2018-01-28 NOTE — Progress Notes (Signed)
Patient Name: Diana Joyce, Diana Joyce Study Date: 01/08/2018 Gender: Female D.O.B: [birth year] Age (years): 26 Referring Provider: Jaclyn ShaggyEnobong Amao Height (inches): 65 Interpreting Physician: Jetty Duhamellinton Shanora Christensen MD, ABSM Weight (lbs): 300 RPSGT: Cherylann ParrDubili, Fred BMI: 50 MRN: [id number] Neck Size: 17.50  CLINICAL INFORMATION The patient is referred for a CPAP titration to treat sleep apnea.  Date of NPSG, Split Night or HST:    NPSG 08/13/17   AHI 37.9/ hr, desaturation to 71%, body weight 305 lbs.  SLEEP STUDY TECHNIQUE As per the AASM Manual for the Scoring of Sleep and Associated Events v2.3 (April 2016) with a hypopnea requiring 4% desaturations.  The channels recorded and monitored were frontal, central and occipital EEG, electrooculogram (EOG), submentalis EMG (chin), nasal and oral airflow, thoracic and abdominal wall motion, anterior tibialis EMG, snore microphone, electrocardiogram, and pulse oximetry. Continuous positive airway pressure (CPAP) was initiated at the beginning of the study and titrated to treat sleep-disordered breathing.  MEDICATIONS Medications self-administered by patient taken the night of the study : TRAZODONE, GABAPENTIN, HYDROXYZINE, SIITAGLIPIN, INSULIN  TECHNICIAN COMMENTS Comments added by technician: PATIENT TOLERATED THE MASK GOOD Comments added by scorer: N/A RESPIRATORY PARAMETERS Optimal PAP Pressure (cm): 13 AHI at Optimal Pressure (/hr): 0.0 Overall Minimal O2 (%): 87.0 Supine % at Optimal Pressure (%): 100 Minimal O2 at Optimal Pressure (%): 91.0   SLEEP ARCHITECTURE The study was initiated at 10:45:58 PM and ended at 4:46:30 AM.  Sleep onset time was 0.8 minutes and the sleep efficiency was 99.1%%. The total sleep time was 357.3 minutes.  The patient spent 0.8%% of the night in stage N1 sleep, 66.6%% in stage N2 sleep, 22.0%% in stage N3 and 10.6% in REM.Stage REM latency was 177.5 minutes  Wake after sleep onset was 2.5. Alpha intrusion was absent.  Supine sleep was 100.00%.  CARDIAC DATA The 2 lead EKG demonstrated sinus rhythm. The mean heart rate was 86.9 beats per minute. Other EKG findings include: None.  The total Periodic Limb Movements of Sleep (PLMS) were 0. The PLMS index was 0.0. A PLMS index of <15 is considered normal in adults.  IMPRESSIONS - The optimal PAP pressure was 13 cm of water. - Central sleep apnea was not noted during this titration (CAI = 0.2/h). - Mild oxygen desaturations were observed during this titration (min O2 = 87.0%). - No snoring was audible during this study. - No cardiac abnormalities were observed during this study. - Clinically significant periodic limb movements were not noted during this study. Arousals associated with PLMs were rare.  DIAGNOSIS - Obstructive Sleep Apnea (327.23 [G47.33 ICD-10])  RECOMMENDATIONS - Trial of CPAP therapy on 13 cwp or DME autopap 10-20. Patient wore a Small size [employer] Respironics Nasal Pillow Mask DreamWear Gel mask and heated humidification. - Be careful with alcohol, sedatives and other CNS depressants that may worsen sleep apnea and disrupt normal sleep architecture. - Sleep hygiene should be reviewed to assess factors that may improve sleep quality. - Weight management and regular exercise should be initiated or continued.  [Electronically signed] 01/28/2018 11:19 AM  Jetty Duhamellinton Chelesea Weiand MD, ABSM Diplomate, American Board of Sleep Medicine   NPI: [id number]                         Jetty Duhamellinton Wilburta Milbourn Diplomate, American Board of Sleep Medicine  ELECTRONICALLY SIGNED ON:  01/28/2018, 11:15 AM [facility] PH: [phone]   FX: [phone] ACCREDITED BY THE AMERICAN ACADEMY OF SLEEP MEDICINE

## 2018-03-09 ENCOUNTER — Ambulatory Visit: Payer: PRIVATE HEALTH INSURANCE | Admitting: Family Medicine

## 2018-04-28 ENCOUNTER — Ambulatory Visit: Payer: PRIVATE HEALTH INSURANCE | Admitting: Family Medicine

## 2018-05-09 ENCOUNTER — Other Ambulatory Visit: Payer: Self-pay

## 2018-05-09 ENCOUNTER — Encounter (HOSPITAL_BASED_OUTPATIENT_CLINIC_OR_DEPARTMENT_OTHER): Payer: Self-pay | Admitting: Adult Health

## 2018-05-09 ENCOUNTER — Emergency Department (HOSPITAL_BASED_OUTPATIENT_CLINIC_OR_DEPARTMENT_OTHER)
Admission: EM | Admit: 2018-05-09 | Discharge: 2018-05-09 | Disposition: A | Discharge status: Home / Self Care | Payer: No Typology Code available for payment source | Attending: Emergency Medicine | Admitting: Emergency Medicine

## 2018-05-09 DIAGNOSIS — N764 Abscess of vulva: Secondary | ICD-10-CM | POA: Diagnosis not present

## 2018-05-09 DIAGNOSIS — Z79899 Other long term (current) drug therapy: Secondary | ICD-10-CM | POA: Diagnosis not present

## 2018-05-09 DIAGNOSIS — E119 Type 2 diabetes mellitus without complications: Secondary | ICD-10-CM | POA: Diagnosis not present

## 2018-05-09 DIAGNOSIS — J45909 Unspecified asthma, uncomplicated: Secondary | ICD-10-CM | POA: Insufficient documentation

## 2018-05-09 DIAGNOSIS — Z794 Long term (current) use of insulin: Secondary | ICD-10-CM | POA: Diagnosis not present

## 2018-05-09 DIAGNOSIS — Z87891 Personal history of nicotine dependence: Secondary | ICD-10-CM | POA: Diagnosis not present

## 2018-05-09 MED ORDER — LIDOCAINE HCL 2 % IJ SOLN
INTRAMUSCULAR | Status: AC
  Filled 2018-05-09: qty 20

## 2018-05-09 MED ORDER — LIDOCAINE HCL 2 % IJ SOLN: 5.0000 mL | Freq: Once | INTRAMUSCULAR | Status: DC

## 2018-05-09 MED ORDER — SULFAMETHOXAZOLE-TRIMETHOPRIM 800-160 MG PO TABS: 1.0000 | ORAL_TABLET | Freq: Two times a day (BID) | ORAL | 0 refills | Status: DC

## 2018-05-09 MED ORDER — CEPHALEXIN 500 MG PO CAPS: 500.0000 mg | ORAL_CAPSULE | Freq: Four times a day (QID) | ORAL | 0 refills | Status: DC

## 2018-05-09 NOTE — ED Provider Notes (Addendum)
[facility] EMERGENCY DEPARTMENT Provider Note   CSN: [id number] Arrival date & time: 05/09/18  4315     History   Chief Complaint Chief Complaint  Patient presents with  . Abscess    HPI Diana Joyce is a 27 y.o. female.  HPI Patient presents with swelling in her left labial area.  Has had for the last couple days.  Had a similar episode on the right side but states it is smaller.  No drainage.  No fevers.  She is diabetic.  Was at a smaller swelling in the right side but never on the side before.  Does not have an OB/GYN. Past Medical History:  Diagnosis Date  . Apnea, sleep   . Asthma   . Diabetes [facility])     Patient Active Problem List   Diagnosis Date Noted  . Sleep apnea 12/07/2017  . Obesity 06/19/2017  . Diabetes [facility])     Past Surgical History:  Procedure Laterality Date  . FRACTURE SURGERY       OB History   None      Home Medications    Prior to Admission medications   Medication Sig Start Date End Date Taking? Authorizing Provider  albuterol (PROVENTIL HFA;VENTOLIN HFA) 108 (90 Base) MCG/ACT inhaler Inhale 2 puffs into the lungs every 6 (six) hours as needed for wheezing or shortness of breath. 12/07/17   Charlott Rakes, MD  blood glucose meter kit and supplies KIT Dispense with test strips and lancets based on insurance preference. Use up to four times daily as directed. (FOR ICD-9 250.00, 250.01). 12/07/17   Charlott Rakes, MD  Blood Glucose Monitoring Suppl (TRUE METRIX METER) w/Device KIT Check blood sugars fasting and at bedtime daily Patient not taking: Reported on 12/07/2017 05/07/17   Argentina Donovan, PA-C  cephALEXin (KEFLEX) 500 MG capsule Take 1 capsule (500 mg total) by mouth 4 (four) times daily. 05/09/18   Davonna Belling, MD  gabapentin (NEURONTIN) 300 MG capsule Take 1 capsule (300 mg total) by mouth 2 (two) times daily. 12/07/17   Charlott Rakes, MD  glipiZIDE (GLUCOTROL) 5 MG tablet Take 1 tablet (5 mg total) by mouth 2 (two)  times daily before a meal. 12/07/17   Charlott Rakes, MD  glucose blood (TRUE METRIX BLOOD GLUCOSE TEST) test strip Use as instructed Patient not taking: Reported on 12/07/2017 05/07/17   Argentina Donovan, PA-C  insulin glargine (LANTUS) 100 UNIT/ML injection Inject 0.25 mLs (25 Units total) into the skin at bedtime. 12/07/17 01/06/18  Charlott Rakes, MD  Insulin Pen Needle (PEN NEEDLES) 32G X 4 MM MISC 1 Dose by Does not apply route daily. Patient not taking: Reported on 06/19/2017 05/07/17   Argentina Donovan, PA-C  sulfamethoxazole-trimethoprim (BACTRIM DS,SEPTRA DS) 800-160 MG tablet Take 1 tablet by mouth 2 (two) times daily. 05/09/18   Davonna Belling, MD  TRUEPLUS LANCETS 28G MISC Check blood sugars fasting and at bedtime Patient not taking: Reported on 12/07/2017 05/07/17   Argentina Donovan, PA-C    Family History Family History  Problem Relation Age of Onset  . Kidney failure Mother   . Hypertension Mother   . Diabetes Mother   . Heart failure Mother   . Diabetes Father     Social History Social History   Tobacco Use  . Smoking status: Former Smoker    Types: Cigars  . Smokeless tobacco: Never Used  Substance Use Topics  . Alcohol use: Yes  . Drug use: No  Allergies   Benadryl [diphenhydramine hcl]   Review of Systems Review of Systems  Constitutional: Negative for appetite change and fever.  Genitourinary: Negative for difficulty urinating, vaginal bleeding and vaginal pain.  Musculoskeletal: Negative for back pain.  Neurological: Positive for weakness.     Physical Exam Updated Vital Signs BP 139/80   Pulse 84   Temp 98.4 F (36.9 C) (Oral)   Resp 18   Ht 5' 5"  (1.651 m)   Wt 135.2 kg   LMP 05/04/2018 (Exact Date)   SpO2 96%   BMI 49.59 kg/m   Physical Exam  Constitutional: She appears well-developed.  Cardiovascular: Normal rate.  Abdominal: There is no tenderness.  Genitourinary:  Genitourinary Comments: Tender indurated mass on the  inferior posterior aspect of her left labia majora.  Appears to involve the skin side with some skin changes.  No changes on the mucosal aspect.  Induration is approximately 3 cm long.  Neurological: She is alert.  Skin: Skin is warm.     ED Treatments / Results  Labs (all labs ordered are listed, but only abnormal results are displayed) Labs Reviewed - No data to display  EKG None  Radiology No results found.  Procedures .Marland KitchenIncision and Drainage Date/Time: 05/09/2018 11:12 AM Performed by: Davonna Belling, MD Authorized by: Davonna Belling, MD   Consent:    Consent obtained:  Verbal   Consent given by:  Patient   Risks discussed:  Bleeding, damage to other organs, incomplete drainage and pain   Alternatives discussed:  No treatment, delayed treatment and alternative treatment Location:    Type:  Abscess   Size:  3cm   Location:  Anogenital   Anogenital location:  Vulva Pre-procedure details:    Skin preparation:  Betadine Anesthesia (see MAR for exact dosages):    Anesthesia method:  Local infiltration   Local anesthetic:  Lidocaine 2% w/o epi Procedure type:    Complexity:  Simple Procedure details:    Needle aspiration: no     Incision types:  Single straight   Incision depth:  Subcutaneous   Scalpel blade:  11   Wound management:  Probed and deloculated   Drainage:  Bloody and purulent   Drainage amount:  Scant   Wound treatment:  Wound left open   Packing materials:  None Post-procedure details:    Patient tolerance of procedure:  Tolerated well, no immediate complications   (including critical care time)  Medications Ordered in ED Medications  lidocaine (XYLOCAINE) 2 % (with pres) injection 100 mg (has no administration in time range)  lidocaine (XYLOCAINE) 2 % (with pres) injection (has no administration in time range)     Initial Impression / Assessment and Plan / ED Course  I have reviewed the triage vital signs and the nursing  notes.  Pertinent labs & imaging results that were available during my care of the patient were reviewed by me and considered in my medical decision making (see chart for details).     Patient with pain and swelling in her left labia.  I think it is likely an abscess coming from the skin although Parth was also considered.  There is a similar skin change on the contralateral side.  Incision drainage done with mild return.  Will give antibiotics and have follow-up with OB/GYN.  Final Clinical Impressions(s) / ED Diagnoses   Final diagnoses:  Left genital labial abscess    ED Discharge Orders         Ordered  sulfamethoxazole-trimethoprim (BACTRIM DS,SEPTRA DS) 800-160 MG tablet  2 times daily     05/09/18 1112    cephALEXin (KEFLEX) 500 MG capsule  4 times daily     05/09/18 1112           Davonna Belling, MD 05/09/18 1112    Davonna Belling, MD 05/09/18 1113

## 2018-05-09 NOTE — ED Triage Notes (Signed)
PResents with left labial fluctuant area with pain and warmthhat has been there for 2 days is progressively worsening. The area is not draining. It is painful to ambulate and sit. Denies fevers and chills.

## 2018-06-04 MED FILL — TRUE METRIX TEST STRIP: 25 days supply | Qty: 100 | Fill #1

## 2018-06-04 MED FILL — TRUEplus LANCETS 28G MISC: 25 days supply | Qty: 100 | Fill #1

## 2018-06-04 MED FILL — $LANTUS 100 UNITS/ML VIAL: 100 | 28 days supply | Qty: 10 | Fill #1

## 2018-06-07 ENCOUNTER — Other Ambulatory Visit: Payer: Self-pay

## 2018-06-07 ENCOUNTER — Encounter (HOSPITAL_BASED_OUTPATIENT_CLINIC_OR_DEPARTMENT_OTHER): Payer: Self-pay | Admitting: Emergency Medicine

## 2018-06-07 ENCOUNTER — Emergency Department (HOSPITAL_BASED_OUTPATIENT_CLINIC_OR_DEPARTMENT_OTHER)
Admission: EM | Admit: 2018-06-07 | Discharge: 2018-06-07 | Disposition: A | Discharge status: Home / Self Care | Payer: PRIVATE HEALTH INSURANCE | Attending: Emergency Medicine | Admitting: Emergency Medicine

## 2018-06-07 DIAGNOSIS — B373 Candidiasis of vulva and vagina: Secondary | ICD-10-CM | POA: Diagnosis not present

## 2018-06-07 DIAGNOSIS — N898 Other specified noninflammatory disorders of vagina: Secondary | ICD-10-CM | POA: Diagnosis present

## 2018-06-07 DIAGNOSIS — B3731 Acute candidiasis of vulva and vagina: Secondary | ICD-10-CM

## 2018-06-07 LAB — WET PREP, GENITAL
SPERM: NONE SEEN
TRICH WET PREP: NONE SEEN
YEAST WET PREP: NONE SEEN

## 2018-06-07 LAB — CBG MONITORING, ED: Glucose-Capillary: 194 mg/dL — ABNORMAL HIGH (ref 70–99)

## 2018-06-07 MED ORDER — FLUCONAZOLE 200 MG PO TABS: 200.0000 mg | ORAL_TABLET | Freq: Every day | ORAL | 0 refills | Status: AC

## 2018-06-07 MED FILL — FLUCONAZOLE 200 MG TAB: 200 | 7 days supply | Qty: 7 | Fill #0

## 2018-06-07 NOTE — Discharge Instructions (Signed)
You have a skin yeast infection here in the ED. I am starting yeast medication which should help your symptoms. Call your PCP today to schedule a follow up appointment. Return to the ED with any new or worsening symptoms.

## 2018-06-07 NOTE — ED Provider Notes (Signed)
Emergency Department Provider Note   I have reviewed the triage vital signs and the nursing notes.   HISTORY  Chief Complaint Skin Problem and Rectum problem   HPI Diana Joyce is a 27 y.o. female with PMH of Asthma and DM to the emergency department for evaluation of itching and rash in the vaginal/perineum area.  Symptoms have been ongoing for several weeks and worsening.  Patient is not having significant vaginal discharge or unusual bleeding.  She does not have concern for sexually transmitted infection.  She had a labial abscess drained several weeks ago and continues to have some discomfort.  She states that she had her girlfriend checked her rectum and she described an area of skin tearing near the anus along with rash.  Patient has recently restarted her Lantus and her blood sugars have improved.  She does not take sliding scale insulin.  No radiation of symptoms or other modifying factors.   Past Medical History:  Diagnosis Date  . Apnea, sleep   . Asthma   . Diabetes [facility](HCC)     Patient Active Problem List   Diagnosis Date Noted  . Sleep apnea 12/07/2017  . Obesity 06/19/2017  . Diabetes [facility](HCC)     Past Surgical History:  Procedure Laterality Date  . FRACTURE SURGERY     Allergies Benadryl [diphenhydramine hcl]  Family History  Problem Relation Age of Onset  . Kidney failure Mother   . Hypertension Mother   . Diabetes Mother   . Heart failure Mother   . Diabetes Father     Social History Social History   Tobacco Use  . Smoking status: Former Smoker    Types: Cigars  . Smokeless tobacco: Never Used  Substance Use Topics  . Alcohol use: Yes  . Drug use: No    Review of Systems  Constitutional: No fever/chills Eyes: No visual changes. ENT: No sore throat. Cardiovascular: Denies chest pain. Respiratory: Denies shortness of breath. Gastrointestinal: No abdominal pain.  No nausea, no vomiting.  No diarrhea.  No constipation. Genitourinary:  Negative for dysuria. Musculoskeletal: Negative for back pain. Skin: Perineum skin irritation.  Neurological: Negative for headaches, focal weakness or numbness.  10-point ROS otherwise negative.  ____________________________________________   PHYSICAL EXAM:  VITAL SIGNS: ED Triage Vitals  Enc Vitals Group     BP 06/07/18 0753 (!) 127/52     Pulse Rate 06/07/18 0753 82     Resp 06/07/18 0753 16     Temp 06/07/18 0753 98.5 F (36.9 C)     Temp src --      SpO2 06/07/18 0753 100 %     Weight 06/07/18 0754 298 lb (135.2 kg)     Height 06/07/18 0754 5\' 5"  (1.651 m)     Pain Score 06/07/18 0753 7   Constitutional: Alert and oriented. Well appearing and in no acute distress. Eyes: Conjunctivae are normal.  Head: Atraumatic. Nose: No congestion/rhinnorhea. Mouth/Throat: Mucous membranes are moist.  Neck: No stridor Cardiovascular: Normal rate, regular rhythm. Good peripheral circulation. Grossly normal heart sounds.   Respiratory: Normal respiratory effort.  No retractions. Lungs CTAB. Gastrointestinal: Soft and nontender. No distention.  Genitourinary: Pelvic performed with nurse chaperone. No visible or palpable labial abscess. Diffuse candidal skin infection surrounding the labia, perineum, and rectum. No vaginal discharge. Scant blood in the vaginal vault. No CMT.  Musculoskeletal: No lower extremity tenderness nor edema. No gross deformities of extremities. Neurologic:  Normal speech and language. No gross focal neurologic deficits are  appreciated.  Skin:  Skin is warm and dry. Candidal type skin infection in the perineum with various areas of erythema and skin breakdown. No hemorrhoids or abscess. No rectal fissure.   ____________________________________________   LABS (all labs ordered are listed, but only abnormal results are displayed)  Labs Reviewed  WET PREP, GENITAL - Abnormal; Notable for the following components:      Result Value   Clue Cells Wet Prep HPF POC  PRESENT (*)    WBC, Wet Prep HPF POC FEW (*)    All other components within normal limits  CBG MONITORING, ED - Abnormal; Notable for the following components:   Glucose-Capillary 194 (*)    All other components within normal limits  CBG MONITORING, ED  GC/CHLAMYDIA PROBE AMP ([place]) [facility]   ____________________________________________  RADIOLOGY  None ____________________________________________   PROCEDURES  Procedure(s) performed:   Procedures  None ____________________________________________   INITIAL IMPRESSION / ASSESSMENT AND PLAN / ED COURSE  Pertinent labs & imaging results that were available during my care of the patient were reviewed by me and considered in my medical decision making (see chart for details).  She presents to the emergency department with candidal type infection surrounding the vulva, perineum, rectum.  No abscess visible or appreciated on physical exam.  No hemorrhoid or perirectal abscess concern.  Patient is now back on her diabetes medication.  Plan for her to continue this and follow-up with her primary care physician.  Will place the patient on oral fluconazole for treatment.   Patient with clue cells on wet prep but no significant vaginal discharge, odor, or discomfort. Will hold on adding abx at this time without significant symptoms and focus on candida rash treatment. Discussed PCP and OB follow up plan.   ____________________________________________  FINAL CLINICAL IMPRESSION(S) / ED DIAGNOSES  Final diagnoses:  Vulvovaginal candidiasis    NEW OUTPATIENT MEDICATIONS STARTED DURING THIS VISIT:  Discharge Medication List as of 06/07/2018  9:12 AM    START taking these medications   Details  fluconazole (DIFLUCAN) 200 MG tablet Take 1 tablet (200 mg total) by mouth daily for 7 days., Starting Mon 06/07/2018, Until Mon 06/14/2018, Print        Note:  This document was prepared using Dragon voice recognition software  and may include unintentional dictation errors.  Alona BeneJoshua Georg Ang, MD Emergency Medicine    Conleigh Heinlein, Arlyss RepressJoshua G, MD 06/07/18 [phone]

## 2018-06-07 NOTE — ED Triage Notes (Signed)
Pt having issues with her labia and rectum.  Pt believes that she has a yeast infection.  She also states there might be an abscess on her labia.  Pt states it is painful to sit and move.

## 2018-06-10 ENCOUNTER — Encounter: Payer: Self-pay | Admitting: Family Medicine

## 2018-06-10 ENCOUNTER — Ambulatory Visit: Payer: PRIVATE HEALTH INSURANCE | Attending: Family Medicine | Admitting: Family Medicine

## 2018-06-10 VITALS — BP 122/85 | HR 82 | Temp 98.0°F | Ht 65.0 in | Wt 313.0 lb

## 2018-06-10 DIAGNOSIS — R600 Localized edema: Secondary | ICD-10-CM

## 2018-06-10 DIAGNOSIS — Z6841 Body Mass Index (BMI) 40.0 and over, adult: Secondary | ICD-10-CM

## 2018-06-10 DIAGNOSIS — Z23 Encounter for immunization: Secondary | ICD-10-CM

## 2018-06-10 DIAGNOSIS — E1169 Type 2 diabetes mellitus with other specified complication: Secondary | ICD-10-CM

## 2018-06-10 DIAGNOSIS — E1165 Type 2 diabetes mellitus with hyperglycemia: Secondary | ICD-10-CM

## 2018-06-10 DIAGNOSIS — G4730 Sleep apnea, unspecified: Secondary | ICD-10-CM

## 2018-06-10 DIAGNOSIS — R5383 Other fatigue: Secondary | ICD-10-CM

## 2018-06-10 DIAGNOSIS — Z794 Long term (current) use of insulin: Secondary | ICD-10-CM

## 2018-06-10 DIAGNOSIS — R51 Headache: Secondary | ICD-10-CM

## 2018-06-10 LAB — GLUCOSE, POCT (MANUAL RESULT ENTRY): POC Glucose: 191 mg/dl — AB (ref 70–99)

## 2018-06-10 MED ORDER — TETANUS-DIPHTH-ACELL PERTUSSIS 5-2.5-18.5 LF-MCG/0.5 IM SUSP: 0.5000 mL | Freq: Once | INTRAMUSCULAR | 0 refills | Status: DC

## 2018-06-10 MED ORDER — GLIPIZIDE 5 MG PO TABS: 5.0000 mg | ORAL_TABLET | Freq: Two times a day (BID) | ORAL | 6 refills | Status: DC

## 2018-06-10 MED ORDER — FUROSEMIDE 20 MG PO TABS: 20.0000 mg | ORAL_TABLET | Freq: Every day | ORAL | 0 refills | Status: DC

## 2018-06-10 MED ORDER — INSULIN GLARGINE 100 UNIT/ML ~~LOC~~ SOLN: 40.0000 [IU] | Freq: Every day | SUBCUTANEOUS | 6 refills | Status: DC

## 2018-06-10 MED FILL — glipiZIDE 5 MG TABS: 5 | 30 days supply | Qty: 60 | Fill #0

## 2018-06-10 MED FILL — FUROSEMIDE 20 MG TABLET: 20 | 7 days supply | Qty: 7 | Fill #0

## 2018-06-10 NOTE — Progress Notes (Signed)
Subjective:  Patient ID: Diana Joyce, female    DOB: Oct 21, 1990  Age: 28 y.o. MRN: [id number]  CC: Diabetes and Headache   HPI Diana Joyce  is a 28 year old female with a history of type 2 diabetes mellitus (A1c 13.5), morbid obesity, asthma who presents today for follow-up visit She had stopped using her insulin and stopped checking her sugars due to being paranoid about sticking herself and resorted to using just metformin which she had from a leftover prescription but over the last 6 days she resumed using her Lantus and would like to remain on it.  She has been using 40 units rather than 25 units prescribed due to persistently elevated sugars and reports that her random sugars are about 180 on 40 units of Lantus. Denies hypoglycemia. Was previously on gabapentin for neuropathy and reports resolution of symptoms and she no longer needs gabapentin.  She complains of pedal edema for the last 2 days and denies ingestion of high sodium foods, chest pains, shortness of breath or reduced exercise tolerance.  Denies cough pains. 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 She endorses headaches, fatigue and sleep study from 4 months ago revealed a diagnosis of obstructive sleep apnea; she is yet to obtain CPAP machine.  Past Medical History:  Diagnosis Date  . Apnea, sleep   . Asthma   . Diabetes [facility])     Past Surgical History:  Procedure Laterality Date  . FRACTURE SURGERY      Allergies  Allergen Reactions  . Benadryl [Diphenhydramine Hcl] Other (See Comments)    Hives, coughing, itching.      Outpatient Medications Prior to Visit  Medication Sig Dispense Refill  . albuterol (PROVENTIL HFA;VENTOLIN HFA) 108 (90 Base) MCG/ACT inhaler Inhale 2 puffs into the lungs every 6 (six) hours as needed for wheezing or  shortness of breath. 1 Inhaler 0  . blood glucose meter kit and supplies KIT Dispense with test strips and lancets based on insurance preference. Use up to four times daily as directed. (FOR ICD-9 250.00, 250.01). 100 each 12  . fluconazole (DIFLUCAN) 200 MG tablet Take 1 tablet (200 mg total) by mouth daily for 7 days. 7 tablet 0  . glucose blood (TRUE METRIX BLOOD GLUCOSE TEST) test strip Use as instructed 100 each 12  . Insulin Pen Needle (PEN NEEDLES) 32G X 4 MM MISC 1 Dose by Does not apply route daily. 100 each 1  . TRUEPLUS LANCETS 28G MISC Check blood sugars fasting and at bedtime 100 each 1  . glipiZIDE (GLUCOTROL) 5 MG tablet Take 1 tablet (5 mg total) by mouth 2 (two) times daily before a meal. 60 tablet 3  . Blood Glucose Monitoring Suppl (TRUE METRIX METER) w/Device KIT Check blood sugars fasting and at bedtime daily (Patient not taking: Reported on 12/07/2017) 1 kit 1  . sulfamethoxazole-trimethoprim (BACTRIM DS,SEPTRA DS) 800-160 MG tablet Take 1 tablet by mouth 2 (two) times daily. (Patient not taking: Reported on 06/10/2018) 14 tablet 0  . cephALEXin (KEFLEX) 500 MG capsule Take 1 capsule (500 mg total) by mouth 4 (four) times daily. (Patient not taking: Reported on 06/10/2018) 28 capsule 0  . gabapentin (NEURONTIN) 300 MG capsule Take 1 capsule (300 mg total) by mouth 2 (two) times daily. (Patient not taking: Reported  on 06/10/2018) 60 capsule 3  . insulin glargine (LANTUS) 100 UNIT/ML injection Inject 0.25 mLs (25 Units total) into the skin at bedtime. (Patient taking differently: Inject 25 Units into the skin at bedtime. Pt takes more sometimes depending on her blood sugar) 30 mL 3   No facility-administered medications prior to visit.     ROS Review of Systems  Constitutional: Positive for fatigue. Negative for activity change and appetite change.  HENT: Negative for congestion, sinus pressure and sore throat.   Eyes: Negative for visual disturbance.  Respiratory: Negative for  cough, chest tightness, shortness of breath and wheezing.   Cardiovascular: Negative for chest pain and palpitations.  Gastrointestinal: Negative for abdominal distention, abdominal pain and constipation.  Endocrine: Negative for polydipsia.  Genitourinary: Negative for dysuria and frequency.  Musculoskeletal: Negative for arthralgias and back pain.  Skin: Negative for rash.  Neurological: Positive for headaches. Negative for tremors, light-headedness and numbness.  Hematological: Does not bruise/bleed easily.  Psychiatric/Behavioral: Negative for agitation and behavioral problems.    Objective:  BP 122/85   Pulse 82   Temp 98 F (36.7 C) (Oral)   Ht 5' 5" (1.651 m)   Wt (!) 313 lb (142 kg)   LMP 06/07/2018   SpO2 97%   BMI 52.09 kg/m   BP/Weight 06/10/2018 06/07/2018 56/07/1306  Systolic BP 657 846 962  Diastolic BP 85 52 80  Wt. (Lbs) 313 298 298  BMI 52.09 49.59 49.59      Physical Exam Constitutional:      Appearance: She is well-developed. She is obese.  Cardiovascular:     Rate and Rhythm: Normal rate.     Heart sounds: Normal heart sounds. No murmur.  Pulmonary:     Effort: Pulmonary effort is normal.     Breath sounds: Normal breath sounds. No wheezing or rales.  Chest:     Chest wall: No tenderness.  Abdominal:     General: Bowel sounds are normal. There is no distension.     Palpations: Abdomen is soft. There is no mass.     Tenderness: There is no abdominal tenderness.  Musculoskeletal: Normal range of motion.  Skin:    General: Skin is warm.  Neurological:     Mental Status: She is alert and oriented to person, place, and time.  Psychiatric:        Mood and Affect: Mood normal.       CMP Latest Ref Rng & Units 11/22/2017 06/19/2017 03/08/2017  Glucose 65 - 99 mg/dL 414(H) 320(H) 421(H)  BUN 6 - 20 mg/dL _0 Creatinine 0.44 - 1.00 mg/dL 0.69 0.58 0.65  Sodium 135 - 145 mmol/L 132(L) 136 132(L)  Potassium 3.5 - 5.1 mmol/L 3.8 4.4 4.1  Chloride  101 - 111 mmol/L 98(L) 100 99(L)  CO2 22 - 32 mmol/L _1 Calcium 8.9 - 10.3 mg/dL 8.6(L) 9.1 8.4(L)  Total Protein 6.5 - 8.1 g/dL - - 6.9  Total Bilirubin 0.3 - 1.2 mg/dL - - 0.4  Alkaline Phos 38 - 126 U/L - - 99  AST 15 - 41 U/L - - 20  ALT 14 - 54 U/L - - 17    Lipid Panel     Component Value Date/Time   CHOL 169 06/19/2017 0944   TRIG 198 (H) 06/19/2017 0944   HDL 34 (L) 06/19/2017 0944   CHOLHDL 5.0 (H) 06/19/2017 0944   LDLCALC 95 06/19/2017 0944     Lab Results  Component Value  Date   HGBA1C 13.5 (A) 12/07/2017     Assessment & Plan:   1. Type 2 diabetes mellitus with other specified complication, with long-term current use of insulin (HCC) Uncontrolled with A1c of 13.5 due to noncompliance with diabetic diet and insulin but blood sugars reveal improvement now that she has resumed her insulin We will send of A1c and hold off on adjusting regimen due to current improvement Counseled on Diabetic diet, my plate method, 366 minutes of moderate intensity exercise/week Keep blood sugar logs with fasting goals of 80-120 mg/dl, random of less than 180 and in the event of sugars less than 60 mg/dl or greater than 400 mg/dl please notify the clinic ASAP. It is recommended that you undergo annual eye exams and annual foot exams. Pneumonia vaccine is recommended. - POCT glucose (manual entry) - Hemoglobin A1c - glipiZIDE (GLUCOTROL) 5 MG tablet; Take 1 tablet (5 mg total) by mouth 2 (two) times daily before a meal.  Dispense: 60 tablet; Refill: 6 - insulin glargine (LANTUS) 100 UNIT/ML injection; Inject 0.4 mLs (40 Units total) into the skin at bedtime.  Dispense: 30 mL; Refill: 6 - CMP14+EGFR  2. Class 3 severe obesity due to excess calories without serious comorbidity with body mass index (BMI) of 50.0 to 59.9 in adult [facility]) Reduce portion sizes, increase physical activity Discussed the role of Victoza and weight loss and glycemic control however she would like to hold  off at this time  3. Pedal edema Low suspicion for cardiac disease Likely positional versus recent intake of high sodium foods Low-dose Lasix for 1 week, advised to use compression stockings   4. Sleep apnea Could explain headaches and fatigue I have written prescription for CPAP therapy and this will be faxed over to DME company by case manager  Meds ordered this encounter  Medications  . Tdap (BOOSTRIX) 5-2.5-18.5 LF-MCG/0.5 injection    Sig: Inject 0.5 mLs into the muscle once for 1 dose.    Dispense:  0.5 mL    Refill:  0  . furosemide (LASIX) 20 MG tablet    Sig: Take 1 tablet (20 mg total) by mouth daily.    Dispense:  7 tablet    Refill:  0  . glipiZIDE (GLUCOTROL) 5 MG tablet    Sig: Take 1 tablet (5 mg total) by mouth 2 (two) times daily before a meal.    Dispense:  60 tablet    Refill:  6  . insulin glargine (LANTUS) 100 UNIT/ML injection    Sig: Inject 0.4 mLs (40 Units total) into the skin at bedtime.    Dispense:  30 mL    Refill:  6    Follow-up: Return in about 1 month (around 07/11/2018) for Pap smear.   Charlott Rakes MD

## 2018-06-10 NOTE — Progress Notes (Signed)
Patient feet are swollen

## 2018-06-10 NOTE — Patient Instructions (Signed)

## 2018-06-11 LAB — CMP14+EGFR
ALT: 15 IU/L (ref 0–32)
AST: 12 IU/L (ref 0–40)
Albumin/Globulin Ratio: 1.2 (ref 1.2–2.2)
Albumin: 3.6 g/dL (ref 3.5–5.5)
Alkaline Phosphatase: 106 IU/L (ref 39–117)
BILIRUBIN TOTAL: 0.3 mg/dL (ref 0.0–1.2)
BUN/Creatinine Ratio: 13 (ref 9–23)
BUN: 8 mg/dL (ref 6–20)
CO2: 24 mmol/L (ref 20–29)
Calcium: 8.9 mg/dL (ref 8.7–10.2)
Chloride: 100 mmol/L (ref 96–106)
Creatinine, Ser: 0.6 mg/dL (ref 0.57–1.00)
GFR calc non Af Amer: 126 mL/min/{1.73_m2} (ref >59)
GFR, EST AFRICAN AMERICAN: 145 mL/min/{1.73_m2} (ref >59)
Globulin, Total: 2.9 g/dL (ref 1.5–4.5)
Glucose: 218 mg/dL — ABNORMAL HIGH (ref 65–99)
Potassium: 4.5 mmol/L (ref 3.5–5.2)
Sodium: 138 mmol/L (ref 134–144)
TOTAL PROTEIN: 6.5 g/dL (ref 6.0–8.5)

## 2018-06-11 LAB — GC/CHLAMYDIA PROBE AMP (~~LOC~~) NOT AT ARMC
Chlamydia: NEGATIVE
Neisseria Gonorrhea: NEGATIVE

## 2018-06-11 LAB — HEMOGLOBIN A1C
Est. average glucose Bld gHb Est-mCnc: 318 mg/dL
Hgb A1c MFr Bld: 12.7 % — ABNORMAL HIGH (ref 4.8–5.6)

## 2018-06-14 ENCOUNTER — Telehealth: Payer: Self-pay

## 2018-06-14 NOTE — Telephone Encounter (Signed)
Call placed to [facility] to confirm receipt of the CPAP referral. She stated that a representative from [facility] spoke to the patient on 06/10/2018 as her insurance termed at the end of 2019. [facility] called the insurance company and they had no record of the patient.  [facility] instructed the patient to call back [facility]) when she receives her new insurance card so the coverage can be verified and they can proceed with processing the referral

## 2018-07-02 ENCOUNTER — Telehealth: Payer: Self-pay

## 2018-07-02 NOTE — Telephone Encounter (Signed)
Call received from the patient. She said that she has insurance but her employer has not yet given her the new insurance card. The insurance carrier has changed from last year. Provided her with the phone # for [facility] to call when she gets the insurance information.  She inquired what she can do if her insurance does not cover the CPAP.  Explained to her that the [employer] has a CPAP assistance program and provides refurbished CPAP machines for a $100 donation. This CM encouraged her to obtain the insurance information for [facility] to verify her coverage for a new CPAP.  She stated that she understood.

## 2018-07-02 NOTE — Telephone Encounter (Signed)
Call placed to [facility] to check on status of CPAP referral.  Spoke to [facility] who stated that she has never called back with her insurance information. They have reached out to her with no success.  This CM attempted to reach the patient.  Call placed to # [phone] and a message was left requesting a call back to this CM # [phone].

## 2018-07-05 IMAGING — CR DG CHEST 2V
2 series · 2 of 2 positions shown · non-contrast
Comparison: September 12, 2016

CLINICAL DATA: Cough.

EXAM:
CHEST  2 VIEW

[w chest pa]
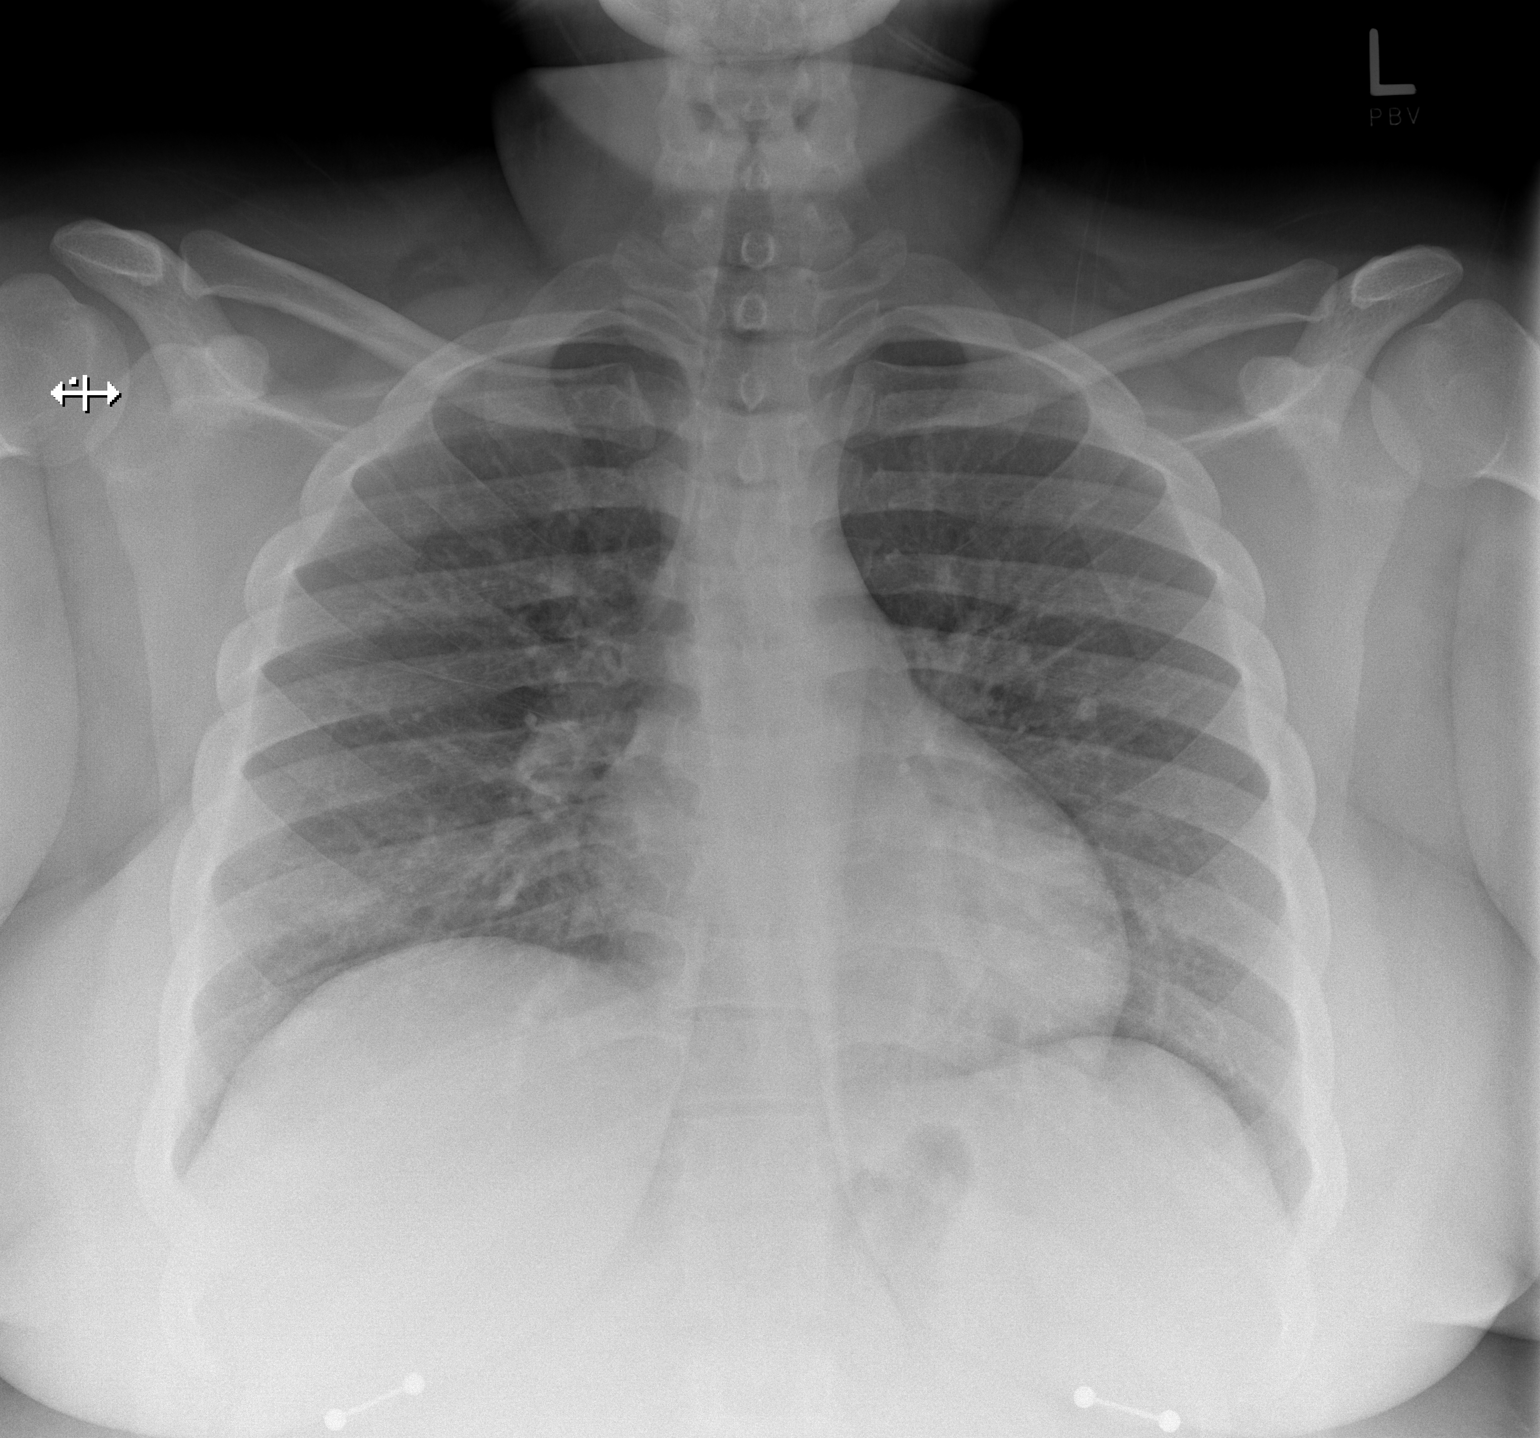

[w chest lat]
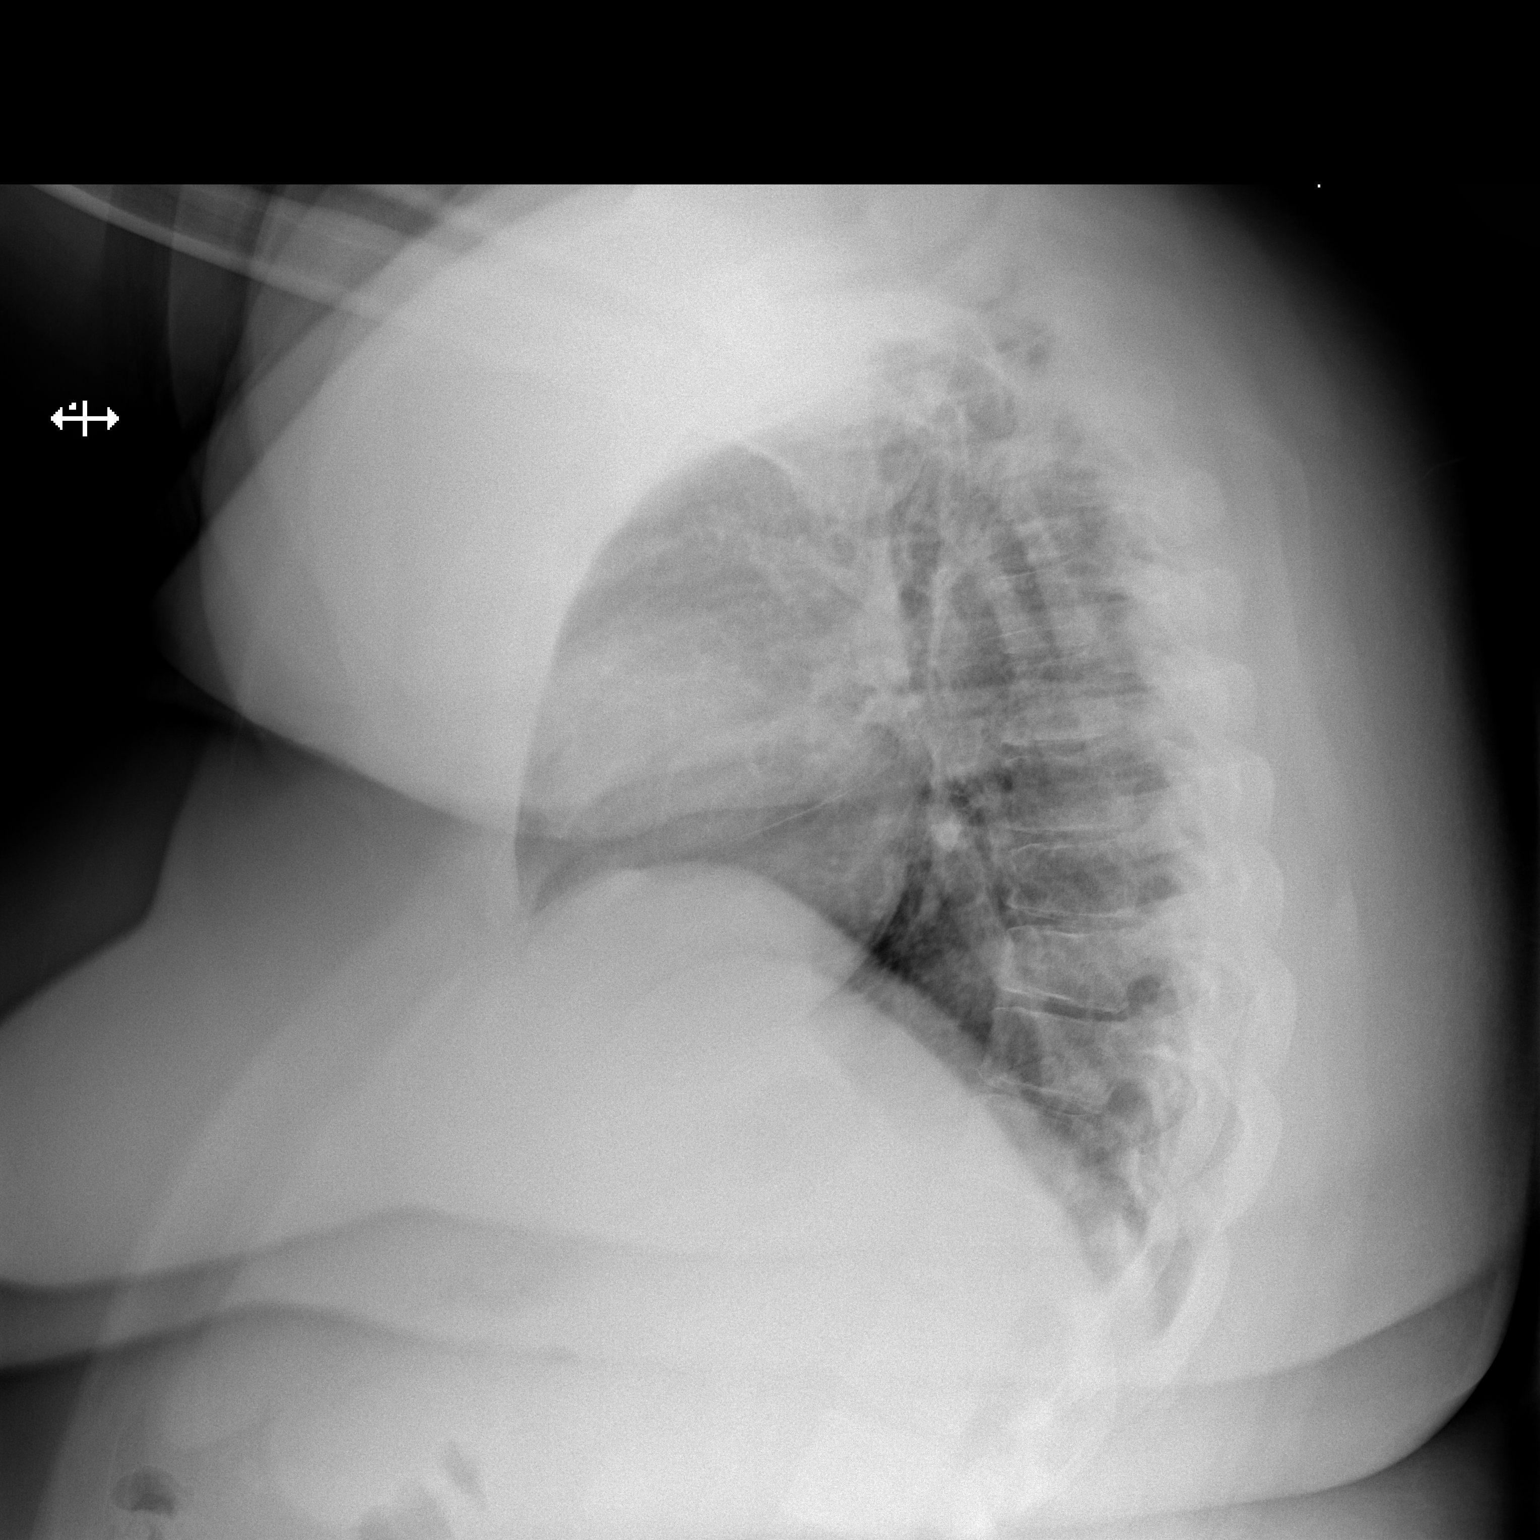

[2 of 2 positions shown; findings below may reference images not displayed]

FINDINGS: The heart size and mediastinal contours are within normal limits.
Both lungs are clear. The visualized skeletal structures are
unremarkable.
IMPRESSION: No active cardiopulmonary disease.

## 2018-07-13 ENCOUNTER — Ambulatory Visit: Payer: PRIVATE HEALTH INSURANCE | Attending: Family Medicine | Admitting: Family Medicine

## 2018-07-13 ENCOUNTER — Encounter: Payer: Self-pay | Admitting: Family Medicine

## 2018-07-13 ENCOUNTER — Telehealth: Payer: Self-pay

## 2018-07-13 VITALS — BP 139/80 | HR 88 | Temp 97.6°F | Ht 65.0 in | Wt 314.0 lb

## 2018-07-13 DIAGNOSIS — E1169 Type 2 diabetes mellitus with other specified complication: Secondary | ICD-10-CM

## 2018-07-13 DIAGNOSIS — E1165 Type 2 diabetes mellitus with hyperglycemia: Secondary | ICD-10-CM

## 2018-07-13 DIAGNOSIS — G4730 Sleep apnea, unspecified: Secondary | ICD-10-CM

## 2018-07-13 DIAGNOSIS — Z124 Encounter for screening for malignant neoplasm of cervix: Secondary | ICD-10-CM

## 2018-07-13 DIAGNOSIS — N920 Excessive and frequent menstruation with regular cycle: Secondary | ICD-10-CM | POA: Diagnosis not present

## 2018-07-13 DIAGNOSIS — Z794 Long term (current) use of insulin: Secondary | ICD-10-CM | POA: Diagnosis not present

## 2018-07-13 DIAGNOSIS — Z6841 Body Mass Index (BMI) 40.0 and over, adult: Secondary | ICD-10-CM

## 2018-07-13 LAB — GLUCOSE, POCT (MANUAL RESULT ENTRY): POC GLUCOSE: 310 mg/dL — AB (ref 70–99)

## 2018-07-13 MED ORDER — NORGESTIM-ETH ESTRAD TRIPHASIC 0.18/0.215/0.25 MG-25 MCG PO TABS: 1.0000 | ORAL_TABLET | Freq: Every day | ORAL | 11 refills | Status: DC

## 2018-07-13 MED FILL — NORG-EE 0.18-0.215-0.25/0.0: 0.18/0.215/ | 28 days supply | Qty: 28 | Fill #0

## 2018-07-13 MED FILL — LANTUS 100 UNITS/ML VIAL: 100 | 25 days supply | Qty: 10 | Fill #0

## 2018-07-13 NOTE — Telephone Encounter (Signed)
Met with patient when she was in the clinic today to discuss her order for CPAP.  She said that she does not have insurance to cover cost of CPAP so she does not have any information to provide to [facility]   CPAP assistance program through the [employer] (ASAA) was discussed.  This CM explained that this program provides refurbished CPAP machines for patients. They request a $100 donation for the machine. The [facility] can assist with the donation fee if the patient is unable to afford the cost.  It is requested that the patient contribute what they can to the cost.  This patient stated that she could bring in her portion of the donation after she is paid at the end of this week.   This CM explained that [facility] will pay for the cost of 1 machine /patient and there is no guarantee that comes with the machine. The patient signed the " Patient Waiver and Release of Claims " for the ASAA. The machine is to be delivered to [address] [facility] and then a teaching session will be arranged for the patient to meet with a respiratory therapist from the hospital to review the use and care of the machine prior to the patient taking it home.  There is no time table for delivery after the order is placed, it can take a couple of weeks to a couple of months.  The patient stated that she understood and wanted to proceed with application for the CPAP.

## 2018-07-13 NOTE — Progress Notes (Signed)
Subjective:  Patient ID: Diana Joyce, female    DOB: 09-17-1990  Age: 28 y.o. MRN: [id number]  CC: Gynecologic Exam   HPI Diana Joyce is a 28 year old female with a history of type 2 diabetes mellitus (A1c 12.7), morbid obesity, asthma who presents today for a Pap smear. She informs me she would like to be referred to as "Diana Joyce" and is in the process of obtaining testosterone shots from [facility] but they are needing her A1c to be less than 10. She informs me her blood sugars have improved but fasting sugars are in the 180s. She is here for Pap smear and complains of heavy menstrual.  Which last 7 days with associated passage of clots from when she attained menarche at 19 and periods are regular. Also has sleep apnea is working on obtaining CPAP machine through the sleep apnea association.  Past Medical History:  Diagnosis Date  . Apnea, sleep   . Asthma   . Diabetes [facility]     Past Surgical History:  Procedure Laterality Date  . FRACTURE SURGERY      Allergies  Allergen Reactions  . Benadryl [Diphenhydramine Hcl] Other (See Comments)    Hives, coughing, itching.       Outpatient Medications Prior to Visit  Medication Sig Dispense Refill  . albuterol (PROVENTIL HFA;VENTOLIN HFA) 108 (90 Base) MCG/ACT inhaler Inhale 2 puffs into the lungs every 6 (six) hours as needed for wheezing or shortness of breath. 1 Inhaler 0  . blood glucose meter kit and supplies KIT Dispense with test strips and lancets based on insurance preference. Use up to four times daily as directed. (FOR ICD-9 250.00, 250.01). 100 each 12  . Blood Glucose Monitoring Suppl (TRUE METRIX METER) w/Device KIT Check blood sugars fasting and at bedtime daily 1 kit 1  . glipiZIDE (GLUCOTROL) 5 MG tablet Take 1 tablet (5 mg total) by mouth 2 (two) times daily before a meal. 60 tablet 6  . glucose blood (TRUE METRIX BLOOD GLUCOSE TEST) test strip Use as instructed 100 each 12  . TRUEPLUS LANCETS 28G MISC  Check blood sugars fasting and at bedtime 100 each 1  . furosemide (LASIX) 20 MG tablet Take 1 tablet (20 mg total) by mouth daily. (Patient not taking: Reported on 07/13/2018) 7 tablet 0  . insulin glargine (LANTUS) 100 UNIT/ML injection Inject 0.4 mLs (40 Units total) into the skin at bedtime. 30 mL 6  . Insulin Pen Needle (PEN NEEDLES) 32G X 4 MM MISC 1 Dose by Does not apply route daily. (Patient not taking: Reported on 07/13/2018) 100 each 1  . sulfamethoxazole-trimethoprim (BACTRIM DS,SEPTRA DS) 800-160 MG tablet Take 1 tablet by mouth 2 (two) times daily. (Patient not taking: Reported on 06/10/2018) 14 tablet 0   No facility-administered medications prior to visit.     ROS Review of Systems  Constitutional: Negative for activity change, appetite change and fatigue.  HENT: Negative for congestion, sinus pressure and sore throat.   Eyes: Negative for visual disturbance.  Respiratory: Negative for cough, chest tightness, shortness of breath and wheezing.   Cardiovascular: Negative for chest pain and palpitations.  Gastrointestinal: Negative for abdominal distention, abdominal pain and constipation.  Endocrine: Negative for polydipsia.  Genitourinary: Positive for menstrual problem. Negative for dysuria and frequency.  Musculoskeletal: Negative.  Negative for arthralgias and back pain.  Skin: Negative for rash.  Neurological: Negative for tremors, light-headedness and numbness.  Hematological: Does not bruise/bleed easily.  Psychiatric/Behavioral: Negative for agitation, behavioral problems and  dysphoric mood.    Objective:  BP 139/80   Pulse 88   Temp 97.6 F (36.4 C) (Oral)   Ht 5' 5"  (1.651 m)   Wt (!) 314 lb (142.4 kg)   SpO2 100%   BMI 52.25 kg/m   BP/Weight 07/13/2018 06/10/2018 52/01/222  Systolic BP 361 224 497  Diastolic BP 80 85 52  Wt. (Lbs) 314 313 298  BMI 52.25 52.09 49.59      Physical Exam Constitutional:      Appearance: She is well-developed.    Cardiovascular:     Rate and Rhythm: Normal rate.     Heart sounds: Normal heart sounds. No murmur.  Pulmonary:     Effort: Pulmonary effort is normal.     Breath sounds: Normal breath sounds. No wheezing or rales.  Chest:     Chest wall: No tenderness.  Abdominal:     General: Bowel sounds are normal. There is no distension.     Palpations: Abdomen is soft. There is no mass.     Tenderness: There is no abdominal tenderness.  Genitourinary:    Comments: Normal external genitalia, vagina, cervix, adnexa Musculoskeletal: Normal range of motion.  Neurological:     Mental Status: She is alert and oriented to person, place, and time.     Lab Results  Component Value Date   HGBA1C 12.7 (H) 06/10/2018    Assessment & Plan:   1. Type 2 diabetes mellitus with other specified complication, with long-term current use of insulin (HCC) Uncontrolled with A1c of 12.7 Encouraged to uptitrate Lantus by 2 units every fourth day until fasting sugars are at goal Counseled on Diabetic diet, my plate method, 530 minutes of moderate intensity exercise/week Keep blood sugar logs with fasting goals of 80-120 mg/dl, random of less than 180 and in the event of sugars less than 60 mg/dl or greater than 400 mg/dl please notify the clinic ASAP. It is recommended that you undergo annual eye exams and annual foot exams. Pneumonia vaccine is recommended. - POCT glucose (manual entry) - Microalbumin/Creatinine Ratio, Urine  2. Screening for cervical cancer - Cytology - PAP()  3. Sleep apnea, unspecified type Case manager to speak with her regarding obtaining CPAP through the sleep apnea association  4. Menorrhagia with regular cycle - Norgestimate-Ethinyl Estradiol Triphasic (ORTHO TRI-CYCLEN LO) 0.18/0.215/0.25 MG-25 MCG tab; Take 1 tablet by mouth daily.  Dispense: 1 Package; Refill: 11   Meds ordered this encounter  Medications  . Norgestimate-Ethinyl Estradiol Triphasic (ORTHO  TRI-CYCLEN LO) 0.18/0.215/0.25 MG-25 MCG tab    Sig: Take 1 tablet by mouth daily.    Dispense:  1 Package    Refill:  11    Follow-up: Return in about 3 months (around 10/11/2018) for follow up of chronic medical conditions.   Charlott Rakes MD

## 2018-07-13 NOTE — Progress Notes (Signed)
Patient has painful periods and would like to start birth control.  Refill on insulin.

## 2018-07-14 LAB — CYTOLOGY - PAP
Candida vaginitis: POSITIVE — AB
Chlamydia: NEGATIVE
Diagnosis: NEGATIVE
Neisseria Gonorrhea: NEGATIVE
Trichomonas: NEGATIVE

## 2018-07-14 LAB — MICROALBUMIN / CREATININE URINE RATIO
Creatinine, Urine: 78.9 mg/dL
MICROALB/CREAT RATIO: 15 mg/g{creat} (ref 0–29)
Microalbumin, Urine: 11.6 ug/mL

## 2018-07-15 ENCOUNTER — Other Ambulatory Visit (INDEPENDENT_AMBULATORY_CARE_PROVIDER_SITE_OTHER): Payer: Self-pay | Admitting: Family Medicine

## 2018-07-15 MED ORDER — FLUCONAZOLE 150 MG PO TABS: 150.0000 mg | ORAL_TABLET | Freq: Once | ORAL | 0 refills | Status: AC

## 2018-07-15 MED FILL — FLUCONAZOLE 150 MG TABS: 150 | 1 days supply | Qty: 1 | Fill #0

## 2018-07-16 LAB — CERVICOVAGINAL ANCILLARY ONLY: Herpes: NEGATIVE

## 2018-07-19 ENCOUNTER — Telehealth: Payer: Self-pay

## 2018-07-19 NOTE — Telephone Encounter (Signed)
Referral for CPAP faxed to [employer] - CPAP assistance program.

## 2018-07-20 ENCOUNTER — Telehealth: Payer: Self-pay

## 2018-07-20 NOTE — Telephone Encounter (Signed)
This CM spoke to [place], [employer] - CPAP assistance program and [facility]  paid the $100 donation for the patient's CPAP machine.

## 2018-07-28 ENCOUNTER — Telehealth: Payer: Self-pay

## 2018-07-28 NOTE — Telephone Encounter (Signed)
Call placed to the patient and informed her that her CPAP machine has arrived and she has been scheduled for CPAP teaching with the respiratory therapist on 08/04/2018 @ 0930 @ [facility].  She said that she understood.

## 2018-08-04 ENCOUNTER — Telehealth: Payer: Self-pay

## 2018-08-04 NOTE — Telephone Encounter (Signed)
Patient missed her CPAP teaching this morning, called to inform her that the appointment has been rescheduled for 08/10/2018 @ 1430 @ [facility].  She said she would be there

## 2018-08-09 ENCOUNTER — Telehealth: Payer: Self-pay | Admitting: Family Medicine

## 2018-08-09 NOTE — Telephone Encounter (Signed)
Pt called back wanted to make sure what day her appt for cpap machine was and if she should bring her mask.  please follow up

## 2018-08-09 NOTE — Telephone Encounter (Signed)
Call placed to the patient and confirmed her appointment for CPAP teaching tomorrow @ 1430 @ [facility],  Informed her that she does not need to bring her mask with her

## 2018-08-10 ENCOUNTER — Telehealth: Payer: Self-pay

## 2018-08-10 NOTE — Telephone Encounter (Signed)
Patient met with Barbara, Respiratory Therapist/ [facility] and successfully  completed CPAP teaching regarding the use and care of the machine. She was given the machine at the end of the session.  

## 2018-09-01 MED FILL — LANTUS 100 UNITS/ML VIAL: 100 | 25 days supply | Qty: 10 | Fill #1

## 2018-10-12 ENCOUNTER — Ambulatory Visit: Payer: PRIVATE HEALTH INSURANCE | Attending: Family Medicine | Admitting: Family Medicine

## 2018-10-12 ENCOUNTER — Other Ambulatory Visit: Payer: Self-pay

## 2018-10-12 ENCOUNTER — Encounter: Payer: Self-pay | Admitting: Family Medicine

## 2018-10-12 DIAGNOSIS — B373 Candidiasis of vulva and vagina: Secondary | ICD-10-CM

## 2018-10-12 DIAGNOSIS — Z794 Long term (current) use of insulin: Secondary | ICD-10-CM

## 2018-10-12 DIAGNOSIS — E1169 Type 2 diabetes mellitus with other specified complication: Secondary | ICD-10-CM

## 2018-10-12 DIAGNOSIS — B3731 Acute candidiasis of vulva and vagina: Secondary | ICD-10-CM

## 2018-10-12 DIAGNOSIS — E1165 Type 2 diabetes mellitus with hyperglycemia: Secondary | ICD-10-CM | POA: Diagnosis not present

## 2018-10-12 DIAGNOSIS — G4709 Other insomnia: Secondary | ICD-10-CM

## 2018-10-12 MED ORDER — GLUCOSE BLOOD VI STRP: ORAL_STRIP | 12 refills | Status: DC

## 2018-10-12 MED ORDER — INSULIN GLARGINE 100 UNIT/ML ~~LOC~~ SOLN: 45.0000 [IU] | Freq: Every day | SUBCUTANEOUS | 6 refills | Status: DC

## 2018-10-12 MED ORDER — FLUCONAZOLE 150 MG PO TABS: 150.0000 mg | ORAL_TABLET | Freq: Every day | ORAL | 0 refills | Status: DC

## 2018-10-12 MED ORDER — GLIPIZIDE 10 MG PO TABS: 10.0000 mg | ORAL_TABLET | Freq: Two times a day (BID) | ORAL | 1 refills | Status: DC

## 2018-10-12 MED FILL — $LANTUS 100 UNITS/ML VIAL: 100 | 66 days supply | Qty: 30 | Fill #0

## 2018-10-12 MED FILL — glipiZIDE 10 MG TABS: 10 | 30 days supply | Qty: 60 | Fill #0

## 2018-10-12 MED FILL — FLUCONAZOLE 150 MG TABS: 150 | 7 days supply | Qty: 7 | Fill #0

## 2018-10-12 MED FILL — TRUE METRIX TEST STRIP: 30 days supply | Qty: 100 | Fill #0

## 2018-10-12 NOTE — Progress Notes (Signed)
Virtual Visit via Telephone Note  I connected with Diana Joyce, on 10/12/2018 at 9:14 AM by telephone and verified that I am speaking with the correct person using two identifiers.   Consent: I discussed the limitations, risks, security and privacy concerns of performing an evaluation and management service by telephone and the availability of in person appointments. I also discussed with the patient that there may be a patient responsible charge related to this service. The patient expressed understanding and agreed to proceed.   Location of Patient: Home  Location of Provider: Clinic   Persons participating in Telemedicine visit: Merina Behrendt - CMA Dr Margarita Rana - PCP     History of Present Illness: Diana Joyce is a 28 year old female currently under the care of [facility] with plans of Transgender transition with a history of type 2 diabetes mellitus (A1c 12.7 from 06/2018), morbid obesity, asthma who presents today for a follow up visit. Her blood sugars have been running in the 400s and she attributes this to her OCPs which she was placed on for dysmenorrhea and menorrhagia for which she noticed improvement. Discontinuation led to improvement in sugars. She has also had recurrent yeast infections. She is working on cutting back sodas and breads.  She complains of insomnia for the last week. Her work hours are 11pm to 7am and she is unable to fall asleep when she gets home. Denies any recent change to her routine. Denies caffeine intake.   Past Medical History:  Diagnosis Date  . Apnea, sleep   . Asthma   . Diabetes ([place])    Allergies  Allergen Reactions  . Benadryl [Diphenhydramine Hcl] Other (See Comments)    Hives, coughing, itching.     Current Outpatient Medications on File Prior to Visit  Medication Sig Dispense Refill  . albuterol (PROVENTIL HFA;VENTOLIN HFA) 108 (90 Base) MCG/ACT inhaler Inhale 2 puffs into the lungs every 6 (six) hours as  needed for wheezing or shortness of breath. 1 Inhaler 0  . blood glucose meter kit and supplies KIT Dispense with test strips and lancets based on insurance preference. Use up to four times daily as directed. (FOR ICD-9 250.00, 250.01). 100 each 12  . Blood Glucose Monitoring Suppl (TRUE METRIX METER) w/Device KIT Check blood sugars fasting and at bedtime daily 1 kit 1  . glipiZIDE (GLUCOTROL) 5 MG tablet Take 1 tablet (5 mg total) by mouth 2 (two) times daily before a meal. 60 tablet 6  . glucose blood (TRUE METRIX BLOOD GLUCOSE TEST) test strip Use as instructed 100 each 12  . Insulin Pen Needle (PEN NEEDLES) 32G X 4 MM MISC 1 Dose by Does not apply route daily. 100 each 1  . TRUEPLUS LANCETS 28G MISC Check blood sugars fasting and at bedtime 100 each 1  . furosemide (LASIX) 20 MG tablet Take 1 tablet (20 mg total) by mouth daily. (Patient not taking: Reported on 07/13/2018) 7 tablet 0  . insulin glargine (LANTUS) 100 UNIT/ML injection Inject 0.4 mLs (40 Units total) into the skin at bedtime. 30 mL 6  . Norgestimate-Ethinyl Estradiol Triphasic (ORTHO TRI-CYCLEN LO) 0.18/0.215/0.25 MG-25 MCG tab Take 1 tablet by mouth daily. (Patient not taking: Reported on 10/12/2018) 1 Package 11  . sulfamethoxazole-trimethoprim (BACTRIM DS,SEPTRA DS) 800-160 MG tablet Take 1 tablet by mouth 2 (two) times daily. (Patient not taking: Reported on 06/10/2018) 14 tablet 0   No current facility-administered medications on file prior to visit.     Observations/Objective: Awake, alert, oriented  x3 Not in acute distress  CMP Latest Ref Rng & Units 06/10/2018 11/22/2017 06/19/2017  Glucose 65 - 99 mg/dL 218(H) 414(H) 320(H)  BUN 6 - 20 mg/dL _0 Creatinine 0.57 - 1.00 mg/dL 0.60 0.69 0.58  Sodium 134 - 144 mmol/L 138 132(L) 136  Potassium 3.5 - 5.2 mmol/L 4.5 3.8 4.4  Chloride 96 - 106 mmol/L 100 98(L) 100  CO2 20 - 29 mmol/L _1 Calcium 8.7 - 10.2 mg/dL 8.9 8.6(L) 9.1  Total Protein 6.0 - 8.5 g/dL 6.5 - -   Total Bilirubin 0.0 - 1.2 mg/dL 0.3 - -  Alkaline Phos 39 - 117 IU/L 106 - -  AST 0 - 40 IU/L 12 - -  ALT 0 - 32 IU/L 15 - -    Lipid Panel     Component Value Date/Time   CHOL 169 06/19/2017 0944   TRIG 198 (H) 06/19/2017 0944   HDL 34 (L) 06/19/2017 0944   CHOLHDL 5.0 (H) 06/19/2017 0944   LDLCALC 95 06/19/2017 0944    Lab Results  Component Value Date   HGBA1C 12.7 (H) 06/10/2018    Assessment and Plan: 1. Type 2 diabetes mellitus with other specified complication, with long-term current use of insulin (HCC) Uncontrolled with A1c of 12.7 Increase Lantus to 45 units, increase Glipizide Discussed up titrating Lantus by 2 units until blood sugar are at goal. - glipiZIDE (GLUCOTROL) 10 MG tablet; Take 1 tablet (10 mg total) by mouth 2 (two) times daily before a meal.  Dispense: 180 tablet; Refill: 1 - insulin glargine (LANTUS) 100 UNIT/ML injection; Inject 0.45 mLs (45 Units total) into the skin at bedtime for 30 days.  Dispense: 30 mL; Refill: 6  2. Type 2 diabetes mellitus with hyperglycemia, with long-term current use of insulin (HCC) See #1 - glucose blood (TRUE METRIX BLOOD GLUCOSE TEST) test strip; Use as instructed  Dispense: 100 each; Refill: 12  3. Other insomnia Discussed sleep hygiene, avoiding caffeine Use of Camomile tea and Melatonin  Commence Trazodone if still uncontrolled if above measures fail  4. Vaginal candidiasis Due to hyperglycemia - fluconazole (DIFLUCAN) 150 MG tablet; Take 1 tablet (150 mg total) by mouth daily.  Dispense: 7 tablet; Refill: 0   Follow Up Instructions: Return in about 3 months (around 01/12/2019).    I discussed the assessment and treatment plan with the patient. The patient was provided an opportunity to ask questions and all were answered. The patient agreed with the plan and demonstrated an understanding of the instructions.   The patient was advised to call back or seek an in-person evaluation if the symptoms worsen or if  the condition fails to improve as anticipated.     I provided 25 minutes total of non-face-to-face time during this encounter including median intraservice time, reviewing previous notes, labs, imaging, medications and explaining diagnosis and management.     Charlott Rakes, MD, FAAFP. [facility], [place]   10/12/2018, 9:14 AM

## 2018-10-12 NOTE — Progress Notes (Signed)
Patient has been called and DOB has been verified. Patient has been screened and transferred to PCP to start phone visit.  Refill on insulin.

## 2018-11-15 MED FILL — TRUE METRIX TEST STRIP: 30 days supply | Qty: 100 | Fill #1

## 2018-11-18 IMAGING — CR DG CHEST 2V
2 series · 2 of 2 positions shown · non-contrast
Comparison: Radiographs October 23, 2016

CLINICAL DATA: Chest pain, shortness of breath.

EXAM:
CHEST  2 VIEW

[w chest pa]
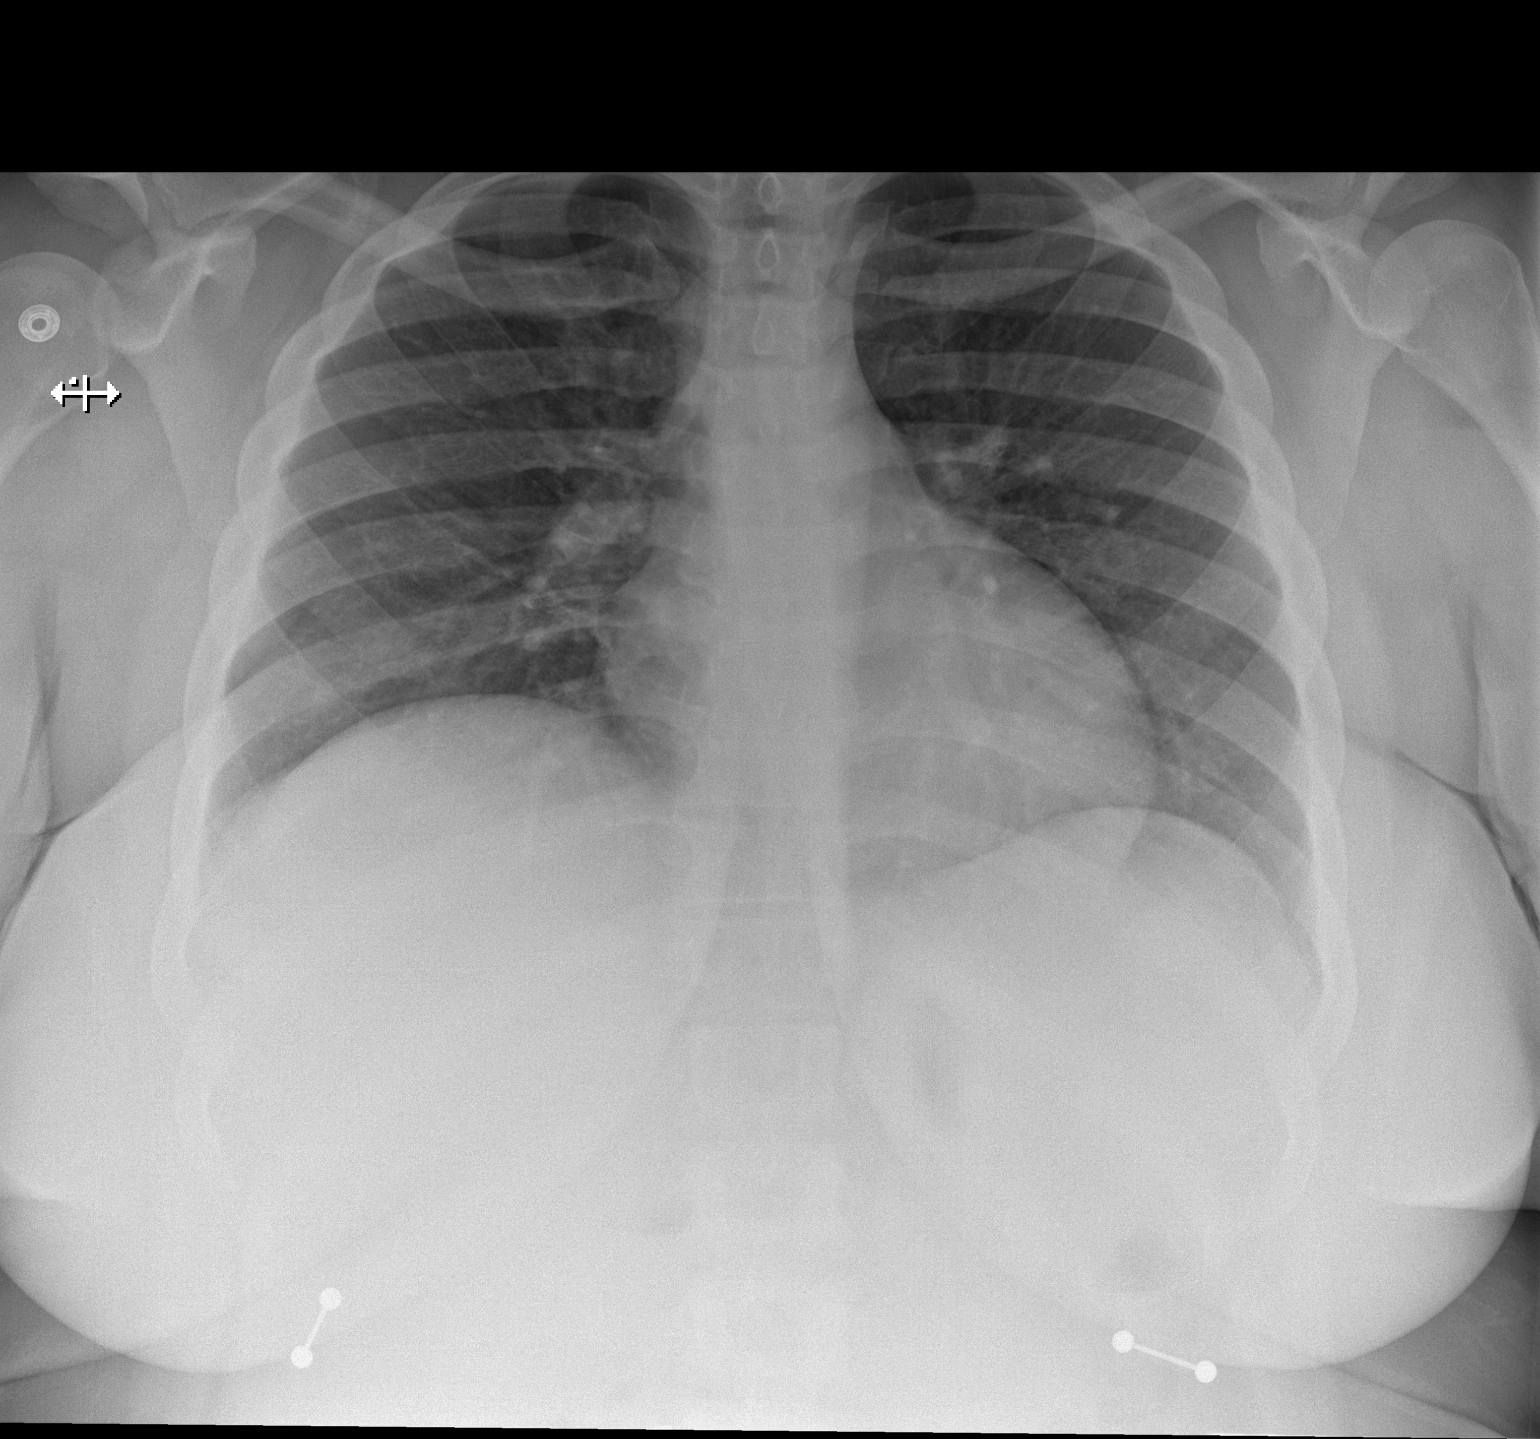

[w chest lat]
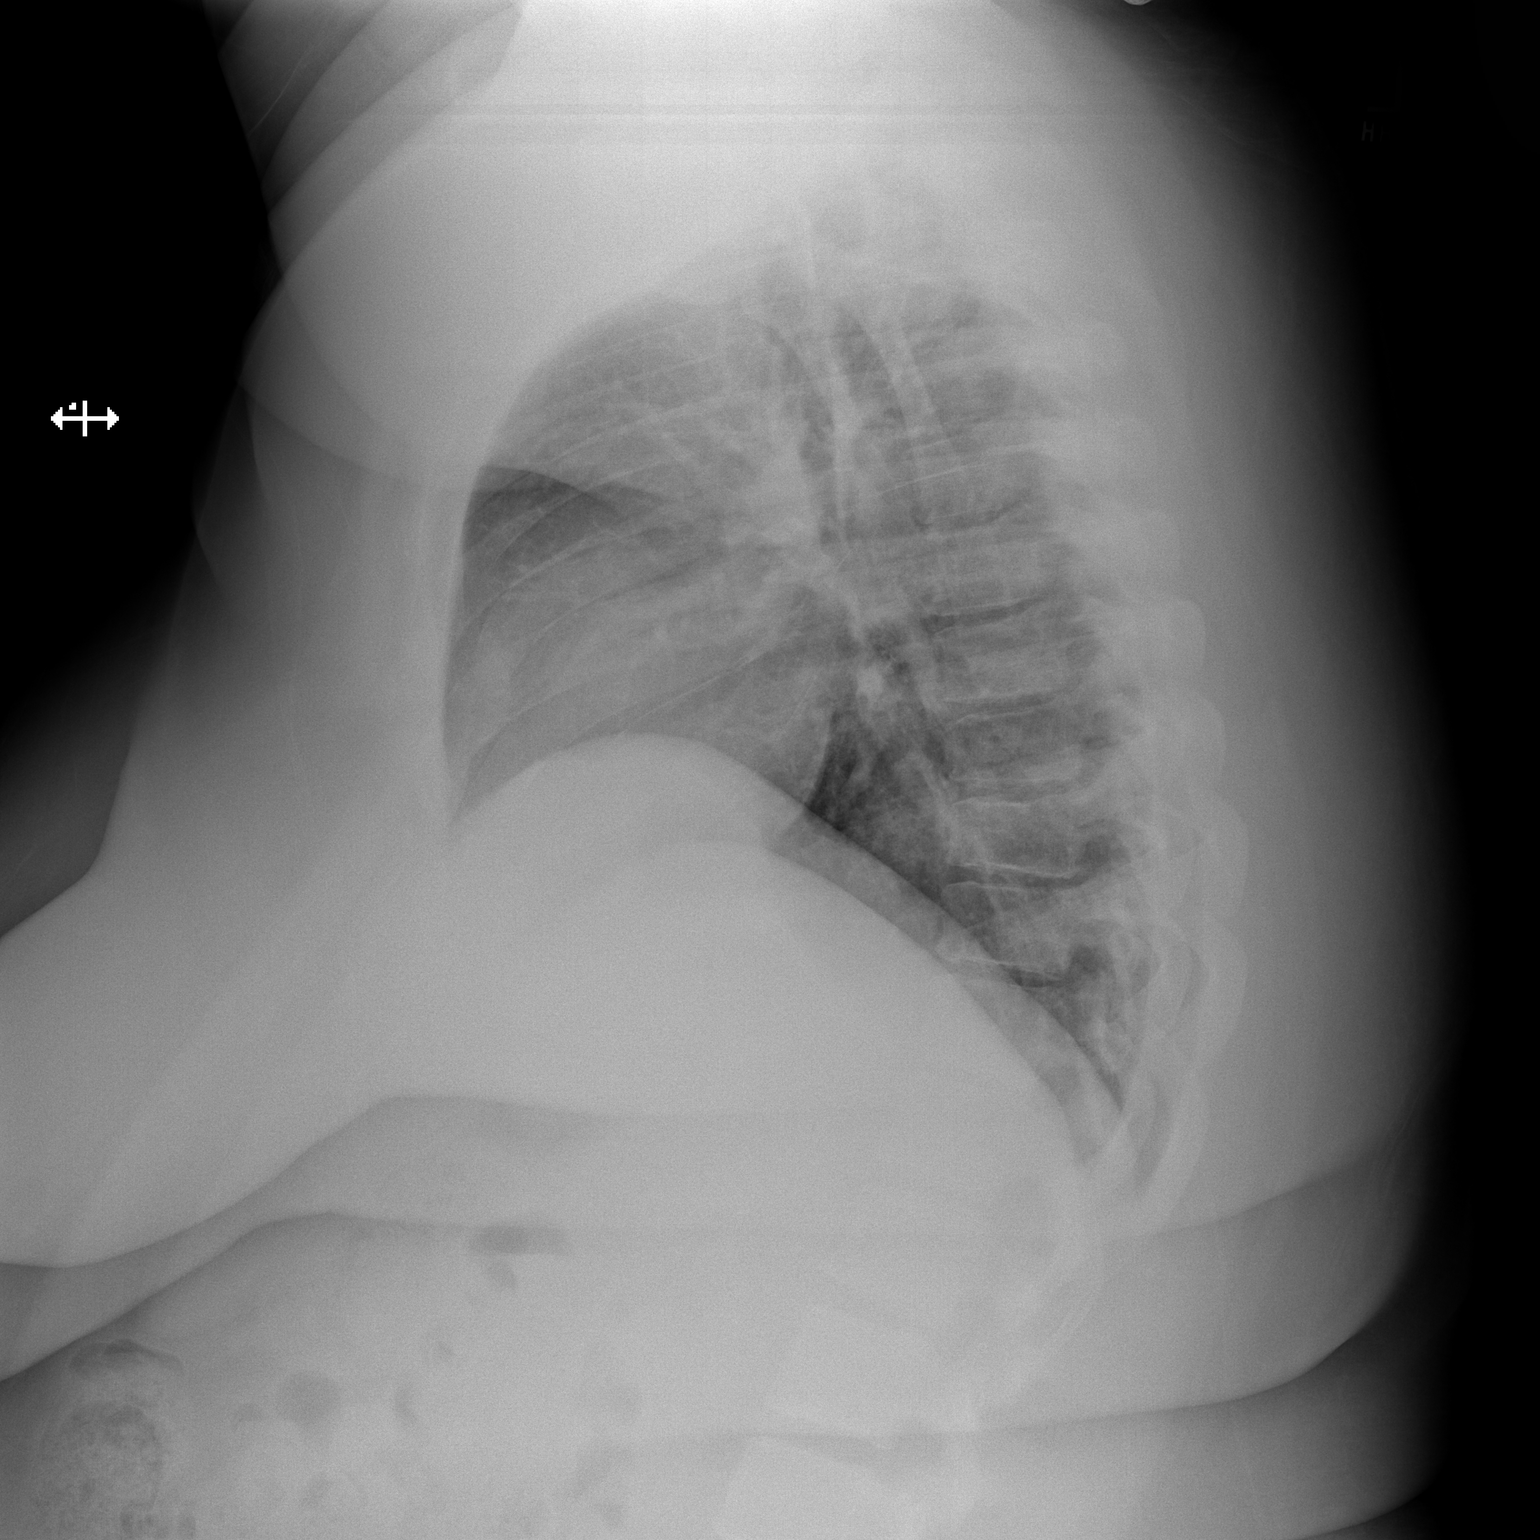

[2 of 2 positions shown; findings below may reference images not displayed]

FINDINGS: The heart size and mediastinal contours are within normal limits.
Both lungs are clear. No pneumothorax or pleural effusion is noted.
The visualized skeletal structures are unremarkable.
IMPRESSION: No active cardiopulmonary disease.

## 2019-01-31 MED FILL — !LANTUS 100 UNITS/ML VIAL: 100 | 22 days supply | Qty: 10 | Fill #1

## 2019-01-31 MED FILL — TRUE METRIX TEST STRIP: 30 days supply | Qty: 100 | Fill #1

## 2019-01-31 MED FILL — ?GLIPIZIDE 10 MG TABLET: 10 | 30 days supply | Qty: 60 | Fill #1

## 2019-04-11 ENCOUNTER — Other Ambulatory Visit: Payer: Self-pay

## 2019-04-11 ENCOUNTER — Ambulatory Visit: Payer: Self-pay | Attending: Family Medicine | Admitting: Family Medicine

## 2019-04-11 ENCOUNTER — Encounter: Payer: Self-pay | Admitting: Family Medicine

## 2019-04-11 VITALS — BP 138/86 | HR 101 | Temp 98.2°F | Ht 65.0 in | Wt 314.0 lb

## 2019-04-11 DIAGNOSIS — Z6841 Body Mass Index (BMI) 40.0 and over, adult: Secondary | ICD-10-CM

## 2019-04-11 DIAGNOSIS — E1165 Type 2 diabetes mellitus with hyperglycemia: Secondary | ICD-10-CM

## 2019-04-11 DIAGNOSIS — B373 Candidiasis of vulva and vagina: Secondary | ICD-10-CM

## 2019-04-11 DIAGNOSIS — Z794 Long term (current) use of insulin: Secondary | ICD-10-CM

## 2019-04-11 DIAGNOSIS — N926 Irregular menstruation, unspecified: Secondary | ICD-10-CM

## 2019-04-11 DIAGNOSIS — B3731 Acute candidiasis of vulva and vagina: Secondary | ICD-10-CM

## 2019-04-11 LAB — POCT GLYCOSYLATED HEMOGLOBIN (HGB A1C): HbA1c, POC (controlled diabetic range): 12.6 % — AB (ref 0.0–7.0)

## 2019-04-11 LAB — GLUCOSE, POCT (MANUAL RESULT ENTRY): POC Glucose: 345 mg/dl — AB (ref 70–99)

## 2019-04-11 MED ORDER — FLUCONAZOLE 150 MG PO TABS: 150.0000 mg | ORAL_TABLET | Freq: Once | ORAL | 1 refills | Status: AC

## 2019-04-11 MED ORDER — VICTOZA 18 MG/3ML ~~LOC~~ SOPN: PEN_INJECTOR | SUBCUTANEOUS | 6 refills | Status: DC

## 2019-04-11 MED ORDER — GLIPIZIDE 10 MG PO TABS: 10.0000 mg | ORAL_TABLET | Freq: Two times a day (BID) | ORAL | 1 refills | Status: DC

## 2019-04-11 MED ORDER — INSULIN GLARGINE 100 UNIT/ML ~~LOC~~ SOLN: 45.0000 [IU] | Freq: Every day | SUBCUTANEOUS | 6 refills | Status: DC

## 2019-04-11 MED FILL — !VICTOZA 18MG/3ML INJECT: 18 | 17 days supply | Qty: 3 | Fill #0

## 2019-04-11 MED FILL — ?GLIPIZIDE 10 MG TABLET: 10 | 30 days supply | Qty: 60 | Fill #0

## 2019-04-11 MED FILL — FLUCONAZOLE 150 MG TABLET: 150 | 3 days supply | Qty: 2 | Fill #0

## 2019-04-11 MED FILL — !LANTUS 100 UNITS/ML VIAL: 100 | 22 days supply | Qty: 10 | Fill #0

## 2019-04-11 NOTE — Progress Notes (Signed)
Subjective:  Patient ID: Diana Joyce, female    DOB: [birth year]  Age: 28 y.o. MRN: [id number]  CC: Diabetes   HPI Diana Joyce "[name]" is a 27 year old female with type 2 diabetes mellitus (A1c 12.6), asthma, morbid obesity here for follow-up visit. Previously followed by [facility] with a goal of gender transition however testosterone shot placed on hold due to hyperglycemia.  She endorses symptoms forgetting to administer Lantus due to her shifts at work which have varied.  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  She also admits she has not been too compliant with a diabetic diet or exercise but promises to do better. Her fasting sugars range between two hundred and two a hundred and twenty.  She complains of recurrent vaginal discharge which is typical of her  yeast infection with associated itching and is requesting a treatment for this. She is concerned that her periods have been late by about 1 week.  This became evident 3 months ago when her cycle occurred on 01/27/2019 and next cycle occurred on 03/09/2019 and she is yet to have another cycle.  She was placed on oral contraceptive pills earlier in the year which had helped with her menorrhagia however she discontinued it due to intolerance.  Menorrhagia has resolved and her periods are down to 3 days from 7 days previously.  Denies abdominal cramps  Past Medical History:  Diagnosis Date   Apnea, sleep    Asthma    Diabetes ([place])     Past Surgical History:  Procedure Laterality Date   FRACTURE SURGERY      Family History  Problem Relation Age of Onset   Kidney failure Mother    Hypertension Mother    Diabetes Mother    Heart failure Mother    Diabetes Father     Allergies  Allergen Reactions   Benadryl [Diphenhydramine Hcl] Other (See Comments)    Hives, coughing, itching.      Outpatient Medications Prior to Visit  Medication Sig Dispense Refill   albuterol (PROVENTIL HFA;VENTOLIN HFA) 108 (90 Base) MCG/ACT inhaler Inhale 2 puffs into the lungs every 6 (six) hours as needed for wheezing or shortness of breath. 1 Inhaler 0   blood glucose meter kit and supplies KIT Dispense with test strips and lancets based on insurance preference. Use up to four times daily as directed. (FOR ICD-9 250.00, 250.01). 100 each 12   Blood Glucose Monitoring Suppl (TRUE METRIX METER) w/Device KIT Check blood sugars fasting and at bedtime daily 1 kit 1   glucose blood (TRUE METRIX BLOOD GLUCOSE TEST) test strip Use as instructed 100 each 12   Insulin Pen Needle (PEN NEEDLES) 32G X 4 MM MISC 1 Dose by Does not apply route daily. 100 each 1   TRUEPLUS LANCETS 28G MISC Check blood sugars fasting and at bedtime 100 each 1   glipiZIDE (GLUCOTROL) 10 MG tablet Take 1 tablet (10 mg total) by mouth 2 (two) times daily before a meal. 180 tablet 1   Norgestimate-Ethinyl Estradiol Triphasic (ORTHO TRI-CYCLEN LO) 0.18/0.215/0.25 MG-25 MCG tab Take 1 tablet by mouth daily. (Patient not taking: Reported on 10/12/2018) 1 Package 11   fluconazole (DIFLUCAN) 150 MG tablet Take 1 tablet (150 mg total) by mouth daily. (Patient not taking: Reported on 04/11/2019) 7 tablet 0   insulin glargine (LANTUS) 100 UNIT/ML injection Inject 0.45  mLs (45 Units total) into the skin at bedtime for 30 days. 30 mL 6   sulfamethoxazole-trimethoprim (BACTRIM DS,SEPTRA DS) 800-160 MG tablet Take 1 tablet by mouth 2 (two) times daily. (Patient not taking: Reported on 06/10/2018) 14 tablet 0   No facility-administered medications prior to visit.      ROS Review of Systems  Constitutional: Negative for activity change, appetite change and fatigue.  HENT: Negative for congestion, sinus pressure and sore throat.   Eyes: Negative for visual disturbance.  Respiratory: Negative for cough, chest tightness, shortness of  breath and wheezing.   Cardiovascular: Negative for chest pain and palpitations.  Gastrointestinal: Negative for abdominal distention, abdominal pain and constipation.  Endocrine: Negative for polydipsia.  Genitourinary: Positive for vaginal discharge. Negative for dysuria and frequency.  Musculoskeletal: Negative for arthralgias and back pain.  Skin: Negative for rash.  Neurological: Negative for tremors, light-headedness and numbness.  Hematological: Does not bruise/bleed easily.  Psychiatric/Behavioral: Negative for agitation and behavioral problems.    Objective:  BP 138/86    Pulse (!) 101    Temp 98.2 F (36.8 C) (Oral)    Ht 5' 5" (1.651 m)    Wt (!) 314 lb (142.4 kg)    SpO2 98%    BMI 52.25 kg/m   BP/Weight 04/11/2019 10/13/9036 08/10/3830  Systolic BP 919 166 060  Diastolic BP 86 80 85  Wt. (Lbs) 314 314 313  BMI 52.25 52.25 52.09      Physical Exam Constitutional:      Appearance: She is well-developed. She is obese.  Neck:     Vascular: No JVD.  Cardiovascular:     Rate and Rhythm: Normal rate.     Heart sounds: Normal heart sounds. No murmur.  Pulmonary:     Effort: Pulmonary effort is normal.     Breath sounds: Normal breath sounds. No wheezing or rales.  Chest:     Chest wall: No tenderness.  Abdominal:     General: Bowel sounds are normal. There is no distension.     Palpations: Abdomen is soft. There is no mass.     Tenderness: There is no abdominal tenderness.  Musculoskeletal: Normal range of motion.     Right lower leg: No edema.     Left lower leg: No edema.  Neurological:     Mental Status: She is alert and oriented to person, place, and time.  Psychiatric:        Mood and Affect: Mood normal.     CMP Latest Ref Rng & Units 06/10/2018 11/22/2017 06/19/2017  Glucose 65 - 99 mg/dL 218(H) 414(H) 320(H)  BUN 6 - 20 mg/dL _0 Creatinine 0.57 - 1.00 mg/dL 0.60 0.69 0.58  Sodium 134 - 144 mmol/L 138 132(L) 136  Potassium 3.5 - 5.2 mmol/L 4.5 3.8 4.4   Chloride 96 - 106 mmol/L 100 98(L) 100  CO2 20 - 29 mmol/L _1 Calcium 8.7 - 10.2 mg/dL 8.9 8.6(L) 9.1  Total Protein 6.0 - 8.5 g/dL 6.5 - -  Total Bilirubin 0.0 - 1.2 mg/dL 0.3 - -  Alkaline Phos 39 - 117 IU/L 106 - -  AST 0 - 40 IU/L 12 - -  ALT 0 - 32 IU/L 15 - -    Lipid Panel     Component Value Date/Time   CHOL 169 06/19/2017 0944   TRIG 198 (H) 06/19/2017 0944   HDL 34 (L) 06/19/2017 0944   CHOLHDL 5.0 (H) 06/19/2017 0459  [place] 95 06/19/2017 0944    CBC    Component Value Date/Time   WBC 10.6 (H) 11/22/2017 0225   RBC 5.02 11/22/2017 0225   HGB 12.0 11/22/2017 0225   HCT 36.2 11/22/2017 0225   PLT 353 11/22/2017 0225   MCV 72.1 (L) 11/22/2017 0225   MCH 23.9 (L) 11/22/2017 0225   MCHC 33.1 11/22/2017 0225   RDW 14.8 11/22/2017 0225   LYMPHSABS 4.1 (H) 11/22/2017 0225   MONOABS 0.6 11/22/2017 0225   EOSABS 0.1 11/22/2017 0225   BASOSABS 0.0 11/22/2017 0225    Lab Results  Component Value Date   HGBA1C 12.6 (A) 04/11/2019    Assessment & Plan:   1. Type 2 diabetes mellitus with hyperglycemia, with long-term current use of insulin (HCC) Uncontrolled with A1c of 12.6 She has had a challenge with remembering to take her medications due to varying shifts at work We have discussed the use of reminders including phone alerts and she will consistently administer Lantus at 6 PM daily Victoza, insulin education provided on uptitrating dose Clinical pharmacist called in for additional reinforcement and education - POCT glucose (manual entry) - POCT glycosylated hemoglobin (Hb A1C) - Lipid panel; Future - CMP14+EGFR; Future - Microalbumin/Creatinine Ratio, Urine; Future - insulin glargine (LANTUS) 100 UNIT/ML injection; Inject 0.45 mLs (45 Units total) into the skin at bedtime.  Dispense: 30 mL; Refill: 6 - glipiZIDE (GLUCOTROL) 10 MG tablet; Take 1 tablet (10 mg total) by mouth 2 (two) times daily before a meal.  Dispense: 180 tablet; Refill: 1 -  liraglutide (VICTOZA) 18 MG/3ML SOPN; Start 0.16m SQ once a day for 7 days, then increase to 1.219monce a day then 1.16m36mhereafter  Dispense: 3 pen; Refill: 6  2. Vaginal candidiasis Recurrent Due to hyperglycemia - fluconazole (DIFLUCAN) 150 MG tablet; Take 1 tablet (150 mg total) by mouth once for 1 dose. Then repeat in 72 hours  Dispense: 2 tablet; Refill: 1  3. Class 3 severe obesity due to excess calories without serious comorbidity with body mass index (BMI) of 50.0 to 59.9 in adult ([facility] 150 minutes of exercise per week, healthy eating (including decreased daily intake of saturated fats, cholesterol, added sugars, sodium) Hopefully addition of Victoza will be beneficial  4.  Menstrual irregularities Patient advised to keep a calendar of her cycle While she was on oral contraceptive pills that might have helped regularize her cycle  Health Care Maintenance: Due for annual eye exam but lack of medical coverage precludes this  Meds ordered this encounter  Medications   fluconazole (DIFLUCAN) 150 MG tablet    Sig: Take 1 tablet (150 mg total) by mouth once for 1 dose. Then repeat in 72 hours    Dispense:  2 tablet    Refill:  1   insulin glargine (LANTUS) 100 UNIT/ML injection    Sig: Inject 0.45 mLs (45 Units total) into the skin at bedtime.    Dispense:  30 mL    Refill:  6   glipiZIDE (GLUCOTROL) 10 MG tablet    Sig: Take 1 tablet (10 mg total) by mouth 2 (two) times daily before a meal.    Dispense:  180 tablet    Refill:  1   liraglutide (VICTOZA) 18 MG/3ML SOPN    Sig: Start 0.6mg72m once a day for 7 days, then increase to 1.2mg 6me a day then 1.16mg t39meafter    Dispense:  3 pen    Refill:  6  Follow-up: Return in about 3 months (around 07/12/2019) for Diabetes Mellitus.       Charlott Rakes, MD, FAAFP. [facility], [place]   04/11/2019, 2:45 PM

## 2019-04-11 NOTE — Progress Notes (Signed)
Patient states that she has a yeast infection. 

## 2019-04-11 NOTE — Patient Instructions (Signed)
Liraglutide injection What is this medicine? LIRAGLUTIDE (LIR a GLOO tide) is used to improve blood sugar control in adults with type 2 diabetes. This medicine may be used with other diabetes medicines. This drug may also reduce the risk of heart attack or stroke if you have type 2 diabetes and risk factors for heart disease. This medicine may be used for other purposes; ask your health care provider or pharmacist if you have questions. COMMON BRAND NAME(S): Victoza What should I tell my health care provider before I take this medicine? They need to know if you have any of these conditions:  endocrine tumors (MEN 2) or if someone in your family had these tumors  gallbladder disease  high cholesterol  history of alcohol abuse problem  history of pancreatitis  kidney disease or if you are on dialysis  liver disease  previous swelling of the tongue, face, or lips with difficulty breathing, difficulty swallowing, hoarseness, or tightening of the throat  stomach problems  thyroid cancer or if someone in your family had thyroid cancer  an unusual or allergic reaction to liraglutide, other medicines, foods, dyes, or preservatives  pregnant or trying to get pregnant  breast-feeding How should I use this medicine? This medicine is for injection under the skin of your upper leg, stomach area, or upper arm. You will be taught how to prepare and give this medicine. Use exactly as directed. Take your medicine at regular intervals. Do not take it more often than directed. It is important that you put your used needles and syringes in a special sharps container. Do not put them in a trash can. If you do not have a sharps container, call your pharmacist or healthcare provider to get one. A special MedGuide will be given to you by the pharmacist with each prescription and refill. Be sure to read this information carefully each time. Talk to your pediatrician regarding the use of this medicine in  children. While this drug may be prescribed for children as young as 10 years of age, precautions do apply. Overdosage: If you think you have taken too much of this medicine contact a poison control center or emergency room at once. NOTE: This medicine is only for you. Do not share this medicine with others. What if I miss a dose? If you miss a dose, take it as soon as you can. If it is almost time for your next dose, take only that dose. Do not take double or extra doses. What may interact with this medicine?  other medicines for diabetes Many medications may cause changes in blood sugar, these include:  alcohol containing beverages  antiviral medicines for HIV or AIDS  aspirin and aspirin-like drugs  certain medicines for blood pressure, heart disease, irregular heart beat  chromium  diuretics  female hormones, such as estrogens or progestins, birth control pills  fenofibrate  gemfibrozil  isoniazid  lanreotide  female hormones or anabolic steroids  MAOIs like Carbex, Eldepryl, Marplan, Nardil, and Parnate  medicines for weight loss  medicines for allergies, asthma, cold, or cough  medicines for depression, anxiety, or psychotic disturbances  niacin  nicotine  NSAIDs, medicines for pain and inflammation, like ibuprofen or naproxen  octreotide  pasireotide  pentamidine  phenytoin  probenecid  quinolone antibiotics such as ciprofloxacin, levofloxacin, ofloxacin  some herbal dietary supplements  steroid medicines such as prednisone or cortisone  sulfamethoxazole; trimethoprim  thyroid hormones Some medications can hide the warning symptoms of low blood sugar (hypoglycemia). You may   need to monitor your blood sugar more closely if you are taking one of these medications. These include:  beta-blockers, often used for high blood pressure or heart problems (examples include atenolol, metoprolol, propranolol)  clonidine  guanethidine  reserpine This  list may not describe all possible interactions. Give your health care provider a list of all the medicines, herbs, non-prescription drugs, or dietary supplements you use. Also tell them if you smoke, drink alcohol, or use illegal drugs. Some items may interact with your medicine. What should I watch for while using this medicine? Visit your doctor or health care professional for regular checks on your progress. Drink plenty of fluids while taking this medicine. Check with your doctor or health care professional if you get an attack of severe diarrhea, nausea, and vomiting. The loss of too much body fluid can make it dangerous for you to take this medicine. A test called the HbA1C (A1C) will be monitored. This is a simple blood test. It measures your blood sugar control over the last 2 to 3 months. You will receive this test every 3 to 6 months. Learn how to check your blood sugar. Learn the symptoms of low and high blood sugar and how to manage them. Always carry a quick-source of sugar with you in case you have symptoms of low blood sugar. Examples include hard sugar candy or glucose tablets. Make sure others know that you can choke if you eat or drink when you develop serious symptoms of low blood sugar, such as seizures or unconsciousness. They must get medical help at once. Tell your doctor or health care professional if you have high blood sugar. You might need to change the dose of your medicine. If you are sick or exercising more than usual, you might need to change the dose of your medicine. Do not skip meals. Ask your doctor or health care professional if you should avoid alcohol. Many nonprescription cough and cold products contain sugar or alcohol. These can affect blood sugar. Pens should never be shared. Even if the needle is changed, sharing may result in passing of viruses like hepatitis or HIV. Wear a medical ID bracelet or chain, and carry a card that describes your disease and details of  your medicine and dosage times. What side effects may I notice from receiving this medicine? Side effects that you should report to your doctor or health care professional as soon as possible:  allergic reactions like skin rash, itching or hives, swelling of the face, lips, or tongue  breathing problems  diarrhea that continues or is severe  lump or swelling on the neck  severe nausea  signs and symptoms of infection like fever or chills; cough; sore throat; pain or trouble passing urine  signs and symptoms of low blood sugar such as feeling anxious, confusion, dizziness, increased hunger, unusually weak or tired, sweating, shakiness, cold, irritable, headache, blurred vision, fast heartbeat, loss of consciousness  signs and symptoms of kidney injury like trouble passing urine or change in the amount of urine  trouble swallowing  unusual stomach upset or pain  vomiting Side effects that usually do not require medical attention (report to your doctor or health care professional if they continue or are bothersome):  constipation  decreased appetite  diarrhea  fatigue  headache  nausea  pain, redness, or irritation at site where injected  stomach upset  stuffy or runny nose This list may not describe all possible side effects. Call your doctor for medical advice   about side effects. You may report side effects to FDA at 1-800-FDA-1088. Where should I keep my medicine? Keep out of the reach of children. Store unopened pen in a refrigerator between 2 and 8 degrees C (36 and 46 degrees F). Do not freeze or use if the medicine has been frozen. Protect from light and excessive heat. After you first use the pen, it can be stored at room temperature between 15 and 30 degrees C (59 and 86 degrees F) or in a refrigerator. Throw away your used pen after 30 days or after the expiration date, whichever comes first. Do not store your pen with the needle attached. If the needle is left  on, medicine may leak from the pen. NOTE: This sheet is a summary. It may not cover all possible information. If you have questions about this medicine, talk to your doctor, pharmacist, or health care provider.  2020 Elsevier/Gold Standard (2017-11-25 15:46:47)  

## 2019-04-12 MED FILL — TRUE METRIX TEST STRIP: 30 days supply | Qty: 100 | Fill #2

## 2019-04-13 ENCOUNTER — Ambulatory Visit: Payer: Self-pay | Attending: Family Medicine

## 2019-04-13 ENCOUNTER — Other Ambulatory Visit: Payer: Self-pay

## 2019-04-13 ENCOUNTER — Other Ambulatory Visit: Payer: Self-pay | Admitting: Pharmacist

## 2019-04-13 DIAGNOSIS — E1165 Type 2 diabetes mellitus with hyperglycemia: Secondary | ICD-10-CM

## 2019-04-13 DIAGNOSIS — Z794 Long term (current) use of insulin: Secondary | ICD-10-CM

## 2019-04-13 MED ORDER — PEN NEEDLES 32G X 4 MM MISC: 1.0000 | Freq: Every day | 1 refills | Status: DC

## 2019-04-14 LAB — CMP14+EGFR
ALT: 13 IU/L (ref 0–32)
AST: 13 IU/L (ref 0–40)
Albumin/Globulin Ratio: 1.2 (ref 1.2–2.2)
Albumin: 3.7 g/dL — ABNORMAL LOW (ref 3.9–5.0)
Alkaline Phosphatase: 116 IU/L (ref 39–117)
BUN/Creatinine Ratio: 11 (ref 9–23)
BUN: 8 mg/dL (ref 6–20)
Bilirubin Total: 0.3 mg/dL (ref 0.0–1.2)
CO2: 24 mmol/L (ref 20–29)
Calcium: 9.2 mg/dL (ref 8.7–10.2)
Chloride: 103 mmol/L (ref 96–106)
Creatinine, Ser: 0.7 mg/dL (ref 0.57–1.00)
GFR calc Af Amer: 138 mL/min/{1.73_m2} (ref >59)
GFR calc non Af Amer: 120 mL/min/{1.73_m2} (ref >59)
Globulin, Total: 3.2 g/dL (ref 1.5–4.5)
Glucose: 258 mg/dL — ABNORMAL HIGH (ref 65–99)
Potassium: 4.2 mmol/L (ref 3.5–5.2)
Sodium: 139 mmol/L (ref 134–144)
Total Protein: 6.9 g/dL (ref 6.0–8.5)

## 2019-04-14 LAB — LIPID PANEL
Chol/HDL Ratio: 4.9 ratio — ABNORMAL HIGH (ref 0.0–4.4)
Cholesterol, Total: 177 mg/dL (ref 100–199)
HDL: 36 mg/dL — ABNORMAL LOW (ref >39)
LDL Chol Calc (NIH): 117 mg/dL — ABNORMAL HIGH (ref 0–99)
Triglycerides: 130 mg/dL (ref 0–149)
VLDL Cholesterol Cal: 24 mg/dL (ref 5–40)

## 2019-04-14 LAB — MICROALBUMIN / CREATININE URINE RATIO
Creatinine, Urine: 152.2 mg/dL
Microalb/Creat Ratio: 21 mg/g creat (ref 0–29)
Microalbumin, Urine: 31.7 ug/mL

## 2019-04-14 MED FILL — TRUEPLUS PEN NDL 32GX5/32: 32G X 4 MM | 25 days supply | Qty: 100 | Fill #0

## 2019-04-19 ENCOUNTER — Encounter: Payer: Self-pay | Admitting: Family Medicine

## 2019-05-12 MED FILL — !LANTUS 100 UNITS/ML VIAL: 100 | 22 days supply | Qty: 10 | Fill #1

## 2019-05-12 MED FILL — VICTOZA 18 MG/3 ML INJECT P: 18 | 30 days supply | Qty: 9 | Fill #1

## 2019-06-07 ENCOUNTER — Other Ambulatory Visit: Payer: Self-pay

## 2019-06-07 ENCOUNTER — Encounter (HOSPITAL_BASED_OUTPATIENT_CLINIC_OR_DEPARTMENT_OTHER): Payer: Self-pay | Admitting: Emergency Medicine

## 2019-06-07 ENCOUNTER — Emergency Department (HOSPITAL_BASED_OUTPATIENT_CLINIC_OR_DEPARTMENT_OTHER)
Admission: EM | Admit: 2019-06-07 | Discharge: 2019-06-07 | Disposition: A | Discharge status: Home / Self Care | Payer: Self-pay | Attending: Emergency Medicine | Admitting: Emergency Medicine

## 2019-06-07 ENCOUNTER — Emergency Department (HOSPITAL_BASED_OUTPATIENT_CLINIC_OR_DEPARTMENT_OTHER): Payer: Self-pay

## 2019-06-07 DIAGNOSIS — H9202 Otalgia, left ear: Secondary | ICD-10-CM | POA: Insufficient documentation

## 2019-06-07 DIAGNOSIS — Z794 Long term (current) use of insulin: Secondary | ICD-10-CM | POA: Insufficient documentation

## 2019-06-07 DIAGNOSIS — F17210 Nicotine dependence, cigarettes, uncomplicated: Secondary | ICD-10-CM | POA: Insufficient documentation

## 2019-06-07 DIAGNOSIS — J45909 Unspecified asthma, uncomplicated: Secondary | ICD-10-CM | POA: Insufficient documentation

## 2019-06-07 DIAGNOSIS — R197 Diarrhea, unspecified: Secondary | ICD-10-CM | POA: Insufficient documentation

## 2019-06-07 DIAGNOSIS — B349 Viral infection, unspecified: Secondary | ICD-10-CM | POA: Insufficient documentation

## 2019-06-07 DIAGNOSIS — Z79899 Other long term (current) drug therapy: Secondary | ICD-10-CM | POA: Insufficient documentation

## 2019-06-07 DIAGNOSIS — E119 Type 2 diabetes mellitus without complications: Secondary | ICD-10-CM | POA: Insufficient documentation

## 2019-06-07 DIAGNOSIS — Z20828 Contact with and (suspected) exposure to other viral communicable diseases: Secondary | ICD-10-CM | POA: Insufficient documentation

## 2019-06-07 LAB — BASIC METABOLIC PANEL
Anion gap: 9 (ref 5–15)
BUN: 6 mg/dL (ref 6–20)
CO2: 27 mmol/L (ref 22–32)
Calcium: 8.4 mg/dL — ABNORMAL LOW (ref 8.9–10.3)
Chloride: 102 mmol/L (ref 98–111)
Creatinine, Ser: 0.57 mg/dL (ref 0.44–1.00)
GFR calc Af Amer: >60 mL/min (ref >60)
GFR calc non Af Amer: >60 mL/min (ref >60)
Glucose, Bld: 83 mg/dL (ref 70–99)
Potassium: 3.6 mmol/L (ref 3.5–5.1)
Sodium: 138 mmol/L (ref 135–145)

## 2019-06-07 LAB — CBC WITH DIFFERENTIAL/PLATELET
Abs Immature Granulocytes: 0.03 10*3/uL (ref 0.00–0.07)
Basophils Absolute: 0 10*3/uL (ref 0.0–0.1)
Basophils Relative: 0 %
Eosinophils Absolute: 0.1 10*3/uL (ref 0.0–0.5)
Eosinophils Relative: 2 %
HCT: 38.1 % (ref 36.0–46.0)
Hemoglobin: 11.4 g/dL — ABNORMAL LOW (ref 12.0–15.0)
Immature Granulocytes: 0 %
Lymphocytes Relative: 28 %
Lymphs Abs: 2.7 10*3/uL (ref 0.7–4.0)
MCH: 22.8 pg — ABNORMAL LOW (ref 26.0–34.0)
MCHC: 29.9 g/dL — ABNORMAL LOW (ref 30.0–36.0)
MCV: 76.4 fL — ABNORMAL LOW (ref 80.0–100.0)
Monocytes Absolute: 0.6 10*3/uL (ref 0.1–1.0)
Monocytes Relative: 6 %
Neutro Abs: 6 10*3/uL (ref 1.7–7.7)
Neutrophils Relative %: 64 %
Platelets: 428 10*3/uL — ABNORMAL HIGH (ref 150–400)
RBC: 4.99 MIL/uL (ref 3.87–5.11)
RDW: 14.9 % (ref 11.5–15.5)
WBC: 9.5 10*3/uL (ref 4.0–10.5)
nRBC: 0 % (ref 0.0–0.2)

## 2019-06-07 LAB — SARS CORONAVIRUS 2 AG (30 MIN TAT): SARS Coronavirus 2 Ag: NEGATIVE

## 2019-06-07 LAB — CBG MONITORING, ED
Glucose-Capillary: 98 mg/dL (ref 70–99)
Glucose-Capillary: 64 mg/dL — ABNORMAL LOW (ref 70–99)

## 2019-06-07 NOTE — ED Provider Notes (Signed)
[place] EMERGENCY DEPARTMENT Provider Note   CSN: [id number] Arrival date & time: 06/07/19  1542     History Chief Complaint  Patient presents with  . COVID symptoms    Diana Joyce is a 28 y.o. female.  Presents to the emergency department with chief complaint of nasal congestion, sore throat and ear pain.  States symptoms started primarily yesterday, seem to be worsening today.  Has not taken any medication for this.  No significant cough.  No difficulty in breathing.  No chest pain.  No fevers.  No known sick contacts.  HPI     Past Medical History:  Diagnosis Date  . Apnea, sleep   . Asthma   . Diabetes [facility])     Patient Active Problem List   Diagnosis Date Noted  . Sleep apnea 12/07/2017  . Class 3 severe obesity due to excess calories without serious comorbidity with body mass index (BMI) of 50.0 to 59.9 in adult ([place]) 06/19/2017  . Diabetes [facility])     Past Surgical History:  Procedure Laterality Date  . FRACTURE SURGERY       OB History   No obstetric history on file.     Family History  Problem Relation Age of Onset  . Kidney failure Mother   . Hypertension Mother   . Diabetes Mother   . Heart failure Mother   . Diabetes Father     Social History   Tobacco Use  . Smoking status: Current Every Day Smoker    Types: Cigars  . Smokeless tobacco: Never Used  Substance Use Topics  . Alcohol use: Yes  . Drug use: No    Home Medications Prior to Admission medications   Medication Sig Start Date End Date Taking? Authorizing Provider  albuterol (PROVENTIL HFA;VENTOLIN HFA) 108 (90 Base) MCG/ACT inhaler Inhale 2 puffs into the lungs every 6 (six) hours as needed for wheezing or shortness of breath. 12/07/17   Charlott Rakes, MD  blood glucose meter kit and supplies KIT Dispense with test strips and lancets based on insurance preference. Use up to four times daily as directed. (FOR ICD-9 250.00, 250.01). 12/07/17   Charlott Rakes, MD  Blood  Glucose Monitoring Suppl (TRUE METRIX METER) w/Device KIT Check blood sugars fasting and at bedtime daily 05/07/17   Freeman Caldron M, PA-C  glipiZIDE (GLUCOTROL) 10 MG tablet Take 1 tablet (10 mg total) by mouth 2 (two) times daily before a meal. 04/11/19   Charlott Rakes, MD  glucose blood (TRUE METRIX BLOOD GLUCOSE TEST) test strip Use as instructed 10/12/18   Charlott Rakes, MD  insulin glargine (LANTUS) 100 UNIT/ML injection Inject 0.45 mLs (45 Units total) into the skin at bedtime. 04/11/19 05/11/19  Charlott Rakes, MD  Insulin Pen Needle (PEN NEEDLES) 32G X 4 MM MISC 1 Dose by Does not apply route daily. 04/13/19   Charlott Rakes, MD  liraglutide (VICTOZA) 18 MG/3ML SOPN Start 0.43m SQ once a day for 7 days, then increase to 1.270monce a day then 1.79m379mhereafter 04/11/19   NewCharlott RakesD  Norgestimate-Ethinyl Estradiol Triphasic (ORTHO TRI-CYCLEN LO) 0.18/0.215/0.25 MG-25 MCG tab Take 1 tablet by mouth daily. Patient not taking: Reported on 10/12/2018 07/13/18   NewCharlott RakesD  TRUEPLUS LANCETS 28G MISC Check blood sugars fasting and at bedtime 05/07/17   McCArgentina DonovanA-C    Allergies    Benadryl [diphenhydramine hcl]  Review of Systems   Review of Systems  Constitutional: Positive for chills and fatigue.  Negative for fever.  HENT: Positive for ear pain and sore throat.   Eyes: Negative for pain and visual disturbance.  Respiratory: Negative for cough and shortness of breath.   Cardiovascular: Negative for chest pain and palpitations.  Gastrointestinal: Negative for abdominal pain and vomiting.  Genitourinary: Negative for dysuria and hematuria.  Musculoskeletal: Negative for arthralgias and back pain.  Skin: Negative for color change and rash.  Neurological: Negative for seizures and syncope.  All other systems reviewed and are negative.   Physical Exam Updated Vital Signs BP 126/77 (BP Location: Right Arm)   Pulse (!) 114   Temp 99.8 F (37.7 C) (Oral)   Resp  19   Ht _0  (1.651 m)   Wt (!) 142.9 kg   SpO2 100%   BMI 52.42 kg/m   Physical Exam Vitals and nursing note reviewed.  Constitutional:      General: She is not in acute distress.    Appearance: She is well-developed.  HENT:     Head: Normocephalic and atraumatic.     Nose: Congestion present.     Mouth/Throat:     Mouth: Mucous membranes are moist.     Pharynx: Oropharynx is clear. No oropharyngeal exudate or posterior oropharyngeal erythema.     Comments: No erythema or exudates in posterior oropharynx Eyes:     Conjunctiva/sclera: Conjunctivae normal.  Cardiovascular:     Rate and Rhythm: Regular rhythm. Tachycardia present.     Heart sounds: No murmur.  Pulmonary:     Effort: Pulmonary effort is normal. No respiratory distress.     Breath sounds: Normal breath sounds.  Abdominal:     Palpations: Abdomen is soft.     Tenderness: There is no abdominal tenderness.  Musculoskeletal:     Cervical back: Neck supple.  Skin:    General: Skin is warm and dry.     Capillary Refill: Capillary refill takes less than 2 seconds.  Neurological:     General: No focal deficit present.     Mental Status: She is alert.     ED Results / Procedures / Treatments   Labs (all labs ordered are listed, but only abnormal results are displayed) Labs Reviewed  CBC WITH DIFFERENTIAL/PLATELET - Abnormal; Notable for the following components:      Result Value   Hemoglobin 11.4 (*)    MCV 76.4 (*)    MCH 22.8 (*)    MCHC 29.9 (*)    Platelets 428 (*)    All other components within normal limits  BASIC METABOLIC PANEL - Abnormal; Notable for the following components:   Calcium 8.4 (*)    All other components within normal limits  CBG MONITORING, ED - Abnormal; Notable for the following components:   Glucose-Capillary 64 (*)    All other components within normal limits  SARS CORONAVIRUS 2 AG (30 MIN TAT)  CBG MONITORING, ED    EKG None  Radiology DG Chest Portable 1 View   Result Date: 06/07/2019 CLINICAL DATA:  Cough, shortness of breath EXAM: PORTABLE CHEST 1 VIEW COMPARISON:  03/08/2017 FINDINGS: The heart size and mediastinal contours are within normal limits. Both lungs are clear. The visualized skeletal structures are unremarkable. IMPRESSION: No active disease. Electronically Signed   By: Kathreen Devoid   On: 06/07/2019 19:53    Procedures Procedures (including critical care time)  Medications Ordered in ED Medications - No data to display  ED Course  I have reviewed the triage vital signs and the nursing  notes.  Pertinent labs & imaging results that were available during my care of the patient were reviewed by me and considered in my medical decision making (see chart for details).    MDM Rules/Calculators/A&P                      28 year old lady presents to ER with concern for nasal congestion, sore throat and ear fullness.  CXR is negative.  Suspect acute viral illness.  Will send for COVID-19.  Labs within normal limits.  On exam she is noted be well-appearing, nontoxic.  Noted mild tachycardia.  Suspect related to acute viral illness.  She has no chest pain, no difficulty in breathing.  Recommended close recheck with primary doctor.  Reviewed return precautions.  Will discharge home.   After the discussed management above, the patient was determined to be safe for discharge.  The patient was in agreement with this plan and all questions regarding their care were answered.  ED return precautions were discussed and the patient will return to the ED with any significant worsening of condition.   Final Clinical Impression(s) / ED Diagnoses Final diagnoses:  Viral syndrome    Rx / DC Orders ED Discharge Orders    None       Lucrezia Starch, MD 06/07/19 2357

## 2019-06-07 NOTE — ED Triage Notes (Signed)
Runny nose, congestion, L ear pain, diarrhea, sneezing since yesterday.

## 2019-06-07 NOTE — Discharge Instructions (Addendum)
Recommend close recheck with your primary doctor, ideally to be seen in 2 to 3 days.  Recommend isolation precautions until we have the results of your PCR test.  Return to ER if you develop any difficulty in breathing, chest pain, vomiting, fever or other new concerning symptom.

## 2019-06-07 NOTE — ED Notes (Signed)
MD Erenest Blank and RN Orlando Penner about Pts low CBG.  Per MD Dystra,it is ok to give Pt orange jucie.

## 2019-06-09 LAB — NOVEL CORONAVIRUS, NAA (HOSP ORDER, SEND-OUT TO REF LAB; TAT 18-24 HRS): SARS-CoV-2, NAA: NOT DETECTED

## 2019-06-15 NOTE — Progress Notes (Signed)
Patient ID: Diana Joyce, female   DOB: 1991/03/14, 29 y.o.   MRN: [id number] Virtual Visit via Telephone Note  I connected with Diana Joyce on 06/16/19 at  3:30 PM EST by telephone and verified that I am speaking with the correct person using two identifiers.   I discussed the limitations, risks, security and privacy concerns of performing an evaluation and management service by telephone and the availability of in person appointments. I also discussed with the patient that there may be a patient responsible charge related to this service. The patient expressed understanding and agreed to proceed.  Patient location:  home My Location:  [facility] office Persons on the call:  Me and the patient    History of Present Illness: Seen in ED 06/07/2019 and diagnosed with viral illness.  Covid test negative but presumed diagnosis.  Still having a lot of congestion  No fever. Mucus is thick and yellow-green and having sinus pressure and pain.  Blood sugars running 80-150.  Mild cough.  No SOB.  No v/d.    From ED note: 29 year old lady presents to ER with concern for nasal congestion, sore throat and ear fullness.  CXR is negative.  Suspect acute viral illness.  Will send for COVID-19.  Labs within normal limits.  On exam she is noted be well-appearing, nontoxic.  Noted mild tachycardia.  Suspect related to acute viral illness.  She has no chest pain, no difficulty in breathing.  Recommended close recheck with primary doctor.  Reviewed return precautions.  Will discharge home.    Observations/Objective:  NAD.  Sounds congested in sinuses.  A&Ox3   Assessment and Plan: 1. Sinusitis, unspecified chronicity, unspecified location Amoxicillin 500mg  tid and diflucan if needed.  Fluids, rest.    2. DM-adequate/improving control  3.  Hospital follow-up-improving overall    Follow Up Instructions: 2 months PCP   I discussed the assessment and treatment plan with the patient. The patient was provided an  opportunity to ask questions and all were answered. The patient agreed with the plan and demonstrated an understanding of the instructions.   The patient was advised to call back or seek an in-person evaluation if the symptoms worsen or if the condition fails to improve as anticipated.  I provided 12 minutes of non-face-to-face time during this encounter.   , PA-C

## 2019-06-16 ENCOUNTER — Other Ambulatory Visit: Payer: Self-pay | Admitting: Family Medicine

## 2019-06-16 ENCOUNTER — Other Ambulatory Visit: Payer: Self-pay

## 2019-06-16 ENCOUNTER — Ambulatory Visit: Payer: Self-pay | Attending: Family Medicine | Admitting: Physician Assistant

## 2019-06-16 DIAGNOSIS — E119 Type 2 diabetes mellitus without complications: Secondary | ICD-10-CM

## 2019-06-16 DIAGNOSIS — Z794 Long term (current) use of insulin: Secondary | ICD-10-CM

## 2019-06-16 DIAGNOSIS — Z09 Encounter for follow-up examination after completed treatment for conditions other than malignant neoplasm: Secondary | ICD-10-CM

## 2019-06-16 DIAGNOSIS — J45909 Unspecified asthma, uncomplicated: Secondary | ICD-10-CM

## 2019-06-16 DIAGNOSIS — E1165 Type 2 diabetes mellitus with hyperglycemia: Secondary | ICD-10-CM

## 2019-06-16 DIAGNOSIS — J329 Chronic sinusitis, unspecified: Secondary | ICD-10-CM

## 2019-06-16 MED ORDER — FLUCONAZOLE 150 MG PO TABS: 150.0000 mg | ORAL_TABLET | Freq: Once | ORAL | 0 refills | Status: AC

## 2019-06-16 MED ORDER — AMOXICILLIN 500 MG PO CAPS: 500.0000 mg | ORAL_CAPSULE | Freq: Three times a day (TID) | ORAL | 0 refills | Status: DC

## 2019-06-16 MED FILL — FLUCONAZOLE 150 MG TABLET: 150 | 1 days supply | Qty: 1 | Fill #0

## 2019-06-16 MED FILL — AMOXICILLIN 500 MG CAPSULE: 500 | 10 days supply | Qty: 30 | Fill #0

## 2019-06-17 MED ORDER — ALBUTEROL SULFATE HFA 108 (90 BASE) MCG/ACT IN AERS: 2.0000 | INHALATION_SPRAY | Freq: Four times a day (QID) | RESPIRATORY_TRACT | 2 refills | Status: DC | PRN

## 2019-06-17 MED ORDER — INSULIN GLARGINE 100 UNIT/ML ~~LOC~~ SOLN: 45.0000 [IU] | Freq: Every day | SUBCUTANEOUS | 2 refills | Status: DC

## 2019-06-17 MED FILL — LANTUS 100 UNITS/ML VIAL: 100 | 22 days supply | Qty: 10 | Fill #0

## 2019-06-17 MED FILL — ALBUTEROL SULFATE HFA 108 (: 108 (90 BAS | 25 days supply | Qty: 18 | Fill #0

## 2019-06-17 NOTE — Telephone Encounter (Signed)
Please fill if appropriate.  

## 2019-06-24 MED FILL — VICTOZA 18 MG/3 ML INJECT P: 18 | 30 days supply | Qty: 9 | Fill #2

## 2019-06-24 MED FILL — ?GLIPIZIDE 10 MG TABLET: 10 | 30 days supply | Qty: 60 | Fill #1

## 2019-07-13 ENCOUNTER — Other Ambulatory Visit: Payer: Self-pay

## 2019-07-13 ENCOUNTER — Encounter: Payer: Self-pay | Admitting: Family Medicine

## 2019-07-13 ENCOUNTER — Ambulatory Visit: Payer: Self-pay | Attending: Family Medicine | Admitting: Family Medicine

## 2019-07-13 VITALS — BP 133/84 | HR 98 | Ht 65.0 in | Wt 309.0 lb

## 2019-07-13 DIAGNOSIS — Z720 Tobacco use: Secondary | ICD-10-CM

## 2019-07-13 DIAGNOSIS — Z794 Long term (current) use of insulin: Secondary | ICD-10-CM

## 2019-07-13 DIAGNOSIS — Z6841 Body Mass Index (BMI) 40.0 and over, adult: Secondary | ICD-10-CM

## 2019-07-13 DIAGNOSIS — E1165 Type 2 diabetes mellitus with hyperglycemia: Secondary | ICD-10-CM

## 2019-07-13 DIAGNOSIS — R82998 Other abnormal findings in urine: Secondary | ICD-10-CM

## 2019-07-13 LAB — POCT URINALYSIS DIP (CLINITEK)
Bilirubin, UA: NEGATIVE
Blood, UA: NEGATIVE
Glucose, UA: NEGATIVE mg/dL
Ketones, POC UA: NEGATIVE mg/dL
Leukocytes, UA: NEGATIVE
Nitrite, UA: NEGATIVE
Spec Grav, UA: 1.02
Urobilinogen, UA: 1 U/dL
pH, UA: 7

## 2019-07-13 LAB — POCT GLYCOSYLATED HEMOGLOBIN (HGB A1C): HbA1c, POC (controlled diabetic range): 9.7 % — AB (ref 0.0–7.0)

## 2019-07-13 LAB — GLUCOSE, POCT (MANUAL RESULT ENTRY): POC Glucose: 111 mg/dl — AB (ref 70–99)

## 2019-07-13 MED ORDER — INSULIN GLARGINE 100 UNIT/ML ~~LOC~~ SOLN: 40.0000 [IU] | Freq: Every day | SUBCUTANEOUS | 6 refills | Status: DC

## 2019-07-13 MED ORDER — GLIPIZIDE ER 10 MG PO TB24: 10.0000 mg | ORAL_TABLET | Freq: Every day | ORAL | 6 refills | Status: DC

## 2019-07-13 MED ORDER — VICTOZA 18 MG/3ML ~~LOC~~ SOPN: PEN_INJECTOR | SUBCUTANEOUS | 6 refills | Status: DC

## 2019-07-13 MED FILL — glipiZIDE XL 10 MG TB24: 10 | 30 days supply | Qty: 30 | Fill #0

## 2019-07-13 MED FILL — !LANTUS 100 UNITS/ML VIAL: 100 | 25 days supply | Qty: 10 | Fill #0

## 2019-07-13 NOTE — Progress Notes (Signed)
Established Patient Office Visit  Subjective:  Patient ID: Diana Joyce, female    DOB: 1991-04-27  Age: 29 y.o. MRN: [id number]  CC:  Chief Complaint  Patient presents with  . Diabetes    HPI Diana Joyce "Diana Joyce" is a 29 year old female with a history of type 2 diabetes (A1c 9.7) mellitus, asthma, and morbid obesity who is here for a follow up visit.  Was previously seen by [facility] with a goal of gender transition but that was placed on hold until her A1c was <10.0.  She has plans to resume care now that her glycemic control is improving.  Diabetes is still uncontrolled but A1c has improved from 12.6 in 04/2019.  She states that she often forgets to take the second dose of glipizide due to her work schedule but endorses compliance with other medications.  Her fasting blood sugars ranged from 60-140 and her Lantus dose was reduced to from 45 to 40 units to prevent hypoglycemia.  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  States she is trying to improve her diet and is trying to meal prep to avoid unhealthy foods.  Denies exercise.  Asthma is controlled and she uses her rescue inhaler as needed.  Today she has a complaint of dark urine for the previous two days.  Denies other urinary symptoms such as dysuria, polyuria, and frequency.  Denies fevers, chills.  She states that she drinks water "all the time" and has not made any changes to her intake.  Past Medical History:  Diagnosis Date  . Apnea, sleep   . Asthma   . Diabetes [facility])     Past Surgical History:  Procedure Laterality Date  . FRACTURE SURGERY      Family History  Problem Relation Age of Onset  . Kidney failure Mother   . Hypertension Mother   . Diabetes Mother   . Heart failure Mother   . Diabetes Father     Social History   Socioeconomic History  . Marital status: Single    Spouse name: Not on file  . Number of children: Not on file  . Years of education: Not on file  .  Highest education level: Not on file  Occupational History  . Not on file  Tobacco Use  . Smoking status: Current Every Day Smoker    Types: Cigars  . Smokeless tobacco: Never Used  Substance and Sexual Activity  . Alcohol use: Yes  . Drug use: No  . Sexual activity: Yes  Other Topics Concern  . Not on file  Social History Narrative  . Not on file   Social Determinants of Health   Financial Resource Strain:   . Difficulty of Paying Living Expenses: Not on file  Food Insecurity:   . Worried About Charity fundraiser in the Last Year: Not on file  . Ran Out of Food in the Last Year: Not on file  Transportation Needs:   . Lack of Transportation (Medical): Not on file  . Lack of Transportation (Non-Medical): Not on file  Physical Activity:   . Days of Exercise per Week: Not on file  . Minutes of Exercise per Session: Not on file  Stress:   . Feeling of Stress : Not on file  Social Connections:   . Frequency of Communication with Friends and Family: Not on file  . Frequency of Social Gatherings with Friends and Family: Not on file  . Attends Religious Services: Not  on file  . Active Member of Clubs or Organizations: Not on file  . Attends Archivist Meetings: Not on file  . Marital Status: Not on file  Intimate Partner Violence:   . Fear of Current or Ex-Partner: Not on file  . Emotionally Abused: Not on file  . Physically Abused: Not on file  . Sexually Abused: Not on file    Outpatient Medications Prior to Visit  Medication Sig Dispense Refill  . albuterol (VENTOLIN HFA) 108 (90 Base) MCG/ACT inhaler Inhale 2 puffs into the lungs every 6 (six) hours as needed for wheezing or shortness of breath. 18 g 2  . blood glucose meter kit and supplies KIT Dispense with test strips and lancets based on insurance preference. Use up to four times daily as directed. (FOR ICD-9 250.00, 250.01). 100 each 12  . Blood Glucose Monitoring Suppl (TRUE METRIX METER) w/Device KIT  Check blood sugars fasting and at bedtime daily 1 kit 1  . glucose blood (TRUE METRIX BLOOD GLUCOSE TEST) test strip Use as instructed 100 each 12  . Insulin Pen Needle (PEN NEEDLES) 32G X 4 MM MISC 1 Dose by Does not apply route daily. 100 each 1  . TRUEPLUS LANCETS 28G MISC Check blood sugars fasting and at bedtime 100 each 1  . glipiZIDE (GLUCOTROL) 10 MG tablet Take 1 tablet (10 mg total) by mouth 2 (two) times daily before a meal. 180 tablet 1  . insulin glargine (LANTUS) 100 UNIT/ML injection Inject 0.45 mLs (45 Units total) into the skin at bedtime. 20 mL 2  . liraglutide (VICTOZA) 18 MG/3ML SOPN Start 0.35m SQ once a day for 7 days, then increase to 1.274monce a day then 1.58m76mhereafter 3 pen 6  . amoxicillin (AMOXIL) 500 MG capsule Take 1 capsule (500 mg total) by mouth 3 (three) times daily. (Patient not taking: Reported on 07/13/2019) 30 capsule 0  . Norgestimate-Ethinyl Estradiol Triphasic (ORTHO TRI-CYCLEN LO) 0.18/0.215/0.25 MG-25 MCG tab Take 1 tablet by mouth daily. (Patient not taking: Reported on 10/12/2018) 1 Package 11   No facility-administered medications prior to visit.    Allergies  Allergen Reactions  . Benadryl [Diphenhydramine Hcl] Other (See Comments)    Hives, coughing, itching.     ROS Review of Systems  Constitutional: Negative for fatigue, fever and unexpected weight change.  HENT: Negative for congestion, rhinorrhea, sinus pressure and sinus pain.   Eyes: Negative for visual disturbance.  Respiratory: Negative for cough, chest tightness and shortness of breath.   Cardiovascular: Negative for chest pain, palpitations and leg swelling.  Gastrointestinal: Negative for abdominal distention, abdominal pain, constipation, diarrhea, nausea and vomiting.  Endocrine: Negative for polydipsia and polyuria.  Genitourinary: Negative for decreased urine volume, difficulty urinating, dysuria, flank pain, frequency, menstrual problem and urgency.       See HPI    Musculoskeletal: Negative for arthralgias and myalgias.  Skin: Negative for color change and rash.  Neurological: Negative for dizziness, tremors, weakness and numbness.  Hematological: Does not bruise/bleed easily.  Psychiatric/Behavioral: Negative for agitation and behavioral problems.      Objective:    Physical Exam  Constitutional: She is oriented to person, place, and time. She appears well-developed and well-nourished. No distress.  HENT:  Head: Normocephalic and atraumatic.  Eyes: Pupils are equal, round, and reactive to light. Conjunctivae and EOM are normal.  Cardiovascular: Normal rate, regular rhythm, normal heart sounds and intact distal pulses.  No murmur heard. Pulmonary/Chest: Effort normal and breath sounds normal.  No respiratory distress. She has no wheezes.  Abdominal: Soft. Bowel sounds are normal. She exhibits no distension. There is no abdominal tenderness. There is no CVA tenderness.  Musculoskeletal:        General: No edema. Normal range of motion.     Cervical back: Normal range of motion and neck supple.  Neurological: She is alert and oriented to person, place, and time.  Skin: Skin is warm and dry. No rash noted.  Psychiatric: She has a normal mood and affect. Her behavior is normal.  Nursing note and vitals reviewed.   BP 133/84   Pulse 98   Ht 5' 5"  (1.651 m)   Wt (!) 309 lb (140.2 kg)   SpO2 99%   BMI 51.42 kg/m     Health Maintenance Due  Topic Date Due  . OPHTHALMOLOGY EXAM  06/08/2002    There are no preventive care reminders to display for this patient.  Lab Results  Component Value Date   TSH 3.170 06/19/2017   Lab Results  Component Value Date   WBC 9.5 06/07/2019   HGB 11.4 (L) 06/07/2019   HCT 38.1 06/07/2019   MCV 76.4 (L) 06/07/2019   PLT 428 (H) 06/07/2019   Lab Results  Component Value Date   NA 138 06/07/2019   K 3.6 06/07/2019   CO2 27 06/07/2019   GLUCOSE 83 06/07/2019   BUN 6 06/07/2019   CREATININE 0.57  06/07/2019   BILITOT 0.3 04/13/2019   ALKPHOS 116 04/13/2019   AST 13 04/13/2019   ALT 13 04/13/2019   PROT 6.9 04/13/2019   ALBUMIN 3.7 (L) 04/13/2019   CALCIUM 8.4 (L) 06/07/2019   ANIONGAP 9 06/07/2019   Lab Results  Component Value Date   CHOL 177 04/13/2019   Lab Results  Component Value Date   HDL 36 (L) 04/13/2019   Lab Results  Component Value Date   LDLCALC 117 (H) 04/13/2019   Lab Results  Component Value Date   TRIG 130 04/13/2019   Lab Results  Component Value Date   CHOLHDL 4.9 (H) 04/13/2019   Lab Results  Component Value Date   HGBA1C 9.7 (A) 07/13/2019      Assessment & Plan:   1. Type 2 diabetes mellitus with hyperglycemia, with long-term current use of insulin (HCC) Uncontrolled with an A1c of 9.7, goal A1c is <7.0 Change glipizide 10 mg BID to glipizide 10 mg 24 hr daily due to noncompliance Continue Lantus and Victoza Emphasized medication compliance Counseled on Diabetic diet, my plate method, 793 minutes of moderate intensity exercise/week Keep blood sugar logs with fasting goals of 80-120 mg/dl, random of less than 180 and in the event of sugars less than 60 mg/dl or greater than 400 mg/dl please notify the clinic ASAP. It is recommended that you undergo annual eye exams and annual foot exams. - POCT glucose (manual entry) - POCT glycosylated hemoglobin (Hb A1C) - glipiZIDE (GLUCOTROL XL) 10 MG 24 hr tablet; Take 1 tablet (10 mg total) by mouth daily with breakfast.  Dispense: 30 tablet; Refill: 6 - insulin glargine (LANTUS) 100 UNIT/ML injection; Inject 0.4 mLs (40 Units total) into the skin at bedtime.  Dispense: 30 mL; Refill: 6 - liraglutide (VICTOZA) 18 MG/3ML SOPN; Start 0.27m SQ once a day for 7 days, then increase to 1.249monce a day then 1.73m66mhereafter  Dispense: 3 pen; Refill: 6 - Lipid panel - Microalbumin / creatinine urine ratio - CMP14+EGFR  2. Dark urine UA indicates bilirubinuria  Order urine microscopy Order  CMP Encouraged the patient to maintain adequate hydration - Urinalysis, microscopic only - POCT URINALYSIS DIP (CLINITEK)  3. Tobacco abuse Patient states that she smokes up to three cigarettes a day Educated on risks associated with smoking Offered medication but she would like to try to quit on her own  4. Class 3 severe obesity due to excess calories without serious comorbidity with body mass index (BMI) of 50.0 to 59.9 in adult [facility]) Counseled on 150 minutes of exercise per week, healthy eating (including decreased daily intake of saturated fats, cholesterol, added sugars, sodium)   Meds ordered this encounter  Medications  . glipiZIDE (GLUCOTROL XL) 10 MG 24 hr tablet    Sig: Take 1 tablet (10 mg total) by mouth daily with breakfast.    Dispense:  30 tablet    Refill:  6    Discontinue immediate release  . insulin glargine (LANTUS) 100 UNIT/ML injection    Sig: Inject 0.4 mLs (40 Units total) into the skin at bedtime.    Dispense:  30 mL    Refill:  6  . liraglutide (VICTOZA) 18 MG/3ML SOPN    Sig: Start 0.97m SQ once a day for 7 days, then increase to 1.254monce a day then 1.50m49mhereafter    Dispense:  3 pen    Refill:  6    Follow-up: Return in about 3 months (around 10/10/2019) for Chronic medical conditions.    PamTomasita MorrowN

## 2019-07-13 NOTE — Patient Instructions (Signed)
Once I review your test results, I will be in touch with you via 'my chart' or a phone call from my office (if you are not signed up for my chart).  

## 2019-07-14 LAB — CMP14+EGFR
ALT: 41 IU/L — ABNORMAL HIGH (ref 0–32)
AST: 39 IU/L (ref 0–40)
Albumin/Globulin Ratio: 1.2 (ref 1.2–2.2)
Albumin: 3.9 g/dL (ref 3.9–5.0)
Alkaline Phosphatase: 115 IU/L (ref 39–117)
BUN/Creatinine Ratio: 12 (ref 9–23)
BUN: 9 mg/dL (ref 6–20)
Bilirubin Total: 0.3 mg/dL (ref 0.0–1.2)
CO2: 23 mmol/L (ref 20–29)
Calcium: 8.9 mg/dL (ref 8.7–10.2)
Chloride: 104 mmol/L (ref 96–106)
Creatinine, Ser: 0.74 mg/dL (ref 0.57–1.00)
GFR calc Af Amer: 128 mL/min/{1.73_m2} (ref >59)
GFR calc non Af Amer: 111 mL/min/{1.73_m2} (ref >59)
Globulin, Total: 3.3 g/dL (ref 1.5–4.5)
Glucose: 115 mg/dL — ABNORMAL HIGH (ref 65–99)
Potassium: 4.1 mmol/L (ref 3.5–5.2)
Sodium: 142 mmol/L (ref 134–144)
Total Protein: 7.2 g/dL (ref 6.0–8.5)

## 2019-07-14 LAB — LIPID PANEL
Chol/HDL Ratio: 3.8 ratio (ref 0.0–4.4)
Cholesterol, Total: 153 mg/dL (ref 100–199)
HDL: 40 mg/dL (ref >39)
LDL Chol Calc (NIH): 94 mg/dL (ref 0–99)
Triglycerides: 102 mg/dL (ref 0–149)
VLDL Cholesterol Cal: 19 mg/dL (ref 5–40)

## 2019-07-14 LAB — MICROALBUMIN / CREATININE URINE RATIO
Creatinine, Urine: 222.9 mg/dL
Microalb/Creat Ratio: 10 mg/g creat (ref 0–29)
Microalbumin, Urine: 21.6 ug/mL

## 2019-07-14 LAB — URINALYSIS, MICROSCOPIC ONLY: Casts: NONE SEEN /lpf

## 2019-07-21 ENCOUNTER — Emergency Department (HOSPITAL_COMMUNITY)
Admission: EM | Admit: 2019-07-21 | Discharge: 2019-07-21 | Disposition: A | Discharge status: Home / Self Care | Payer: Self-pay | Attending: Emergency Medicine | Admitting: Emergency Medicine

## 2019-07-21 ENCOUNTER — Emergency Department (HOSPITAL_COMMUNITY): Payer: Self-pay

## 2019-07-21 ENCOUNTER — Other Ambulatory Visit: Payer: Self-pay

## 2019-07-21 ENCOUNTER — Encounter (HOSPITAL_COMMUNITY): Payer: Self-pay | Admitting: Emergency Medicine

## 2019-07-21 DIAGNOSIS — R000 Tachycardia, unspecified: Secondary | ICD-10-CM | POA: Insufficient documentation

## 2019-07-21 DIAGNOSIS — R0789 Other chest pain: Secondary | ICD-10-CM | POA: Insufficient documentation

## 2019-07-21 DIAGNOSIS — F419 Anxiety disorder, unspecified: Secondary | ICD-10-CM | POA: Insufficient documentation

## 2019-07-21 LAB — CBC
HCT: 36.4 % (ref 36.0–46.0)
Hemoglobin: 11.3 g/dL — ABNORMAL LOW (ref 12.0–15.0)
MCH: 23.6 pg — ABNORMAL LOW (ref 26.0–34.0)
MCHC: 31 g/dL (ref 30.0–36.0)
MCV: 76 fL — ABNORMAL LOW (ref 80.0–100.0)
Platelets: 415 10*3/uL — ABNORMAL HIGH (ref 150–400)
RBC: 4.79 MIL/uL (ref 3.87–5.11)
RDW: 15.3 % (ref 11.5–15.5)
WBC: 10.6 10*3/uL — ABNORMAL HIGH (ref 4.0–10.5)
nRBC: 0 % (ref 0.0–0.2)

## 2019-07-21 LAB — BASIC METABOLIC PANEL
Anion gap: 9 (ref 5–15)
BUN: 6 mg/dL (ref 6–20)
CO2: 25 mmol/L (ref 22–32)
Calcium: 8.9 mg/dL (ref 8.9–10.3)
Chloride: 103 mmol/L (ref 98–111)
Creatinine, Ser: 0.6 mg/dL (ref 0.44–1.00)
GFR calc Af Amer: >60 mL/min (ref >60)
GFR calc non Af Amer: >60 mL/min (ref >60)
Glucose, Bld: 94 mg/dL (ref 70–99)
Potassium: 3.7 mmol/L (ref 3.5–5.1)
Sodium: 137 mmol/L (ref 135–145)

## 2019-07-21 LAB — TROPONIN I (HIGH SENSITIVITY)
Troponin I (High Sensitivity): 3 ng/L (ref <18)
Troponin I (High Sensitivity): <2 ng/L (ref <18)

## 2019-07-21 LAB — D-DIMER, QUANTITATIVE: D-Dimer, Quant: 0.35 ug/mL-FEU (ref 0.00–0.50)

## 2019-07-21 LAB — I-STAT BETA HCG BLOOD, ED (MC, WL, AP ONLY): I-stat hCG, quantitative: <5 m[IU]/mL (ref <5)

## 2019-07-21 MED ORDER — PANTOPRAZOLE SODIUM 20 MG PO TBEC: 20.0000 mg | DELAYED_RELEASE_TABLET | Freq: Every day | ORAL | 0 refills | Status: DC

## 2019-07-21 MED ORDER — PANTOPRAZOLE SODIUM 40 MG IV SOLR
40.0000 mg | Freq: Once | INTRAVENOUS | Status: AC
  Administered 2019-07-21: 40 mg via INTRAVENOUS
  Filled 2019-07-21: qty 40

## 2019-07-21 MED ORDER — ALUM & MAG HYDROXIDE-SIMETH 200-200-20 MG/5ML PO SUSP
30.0000 mL | Freq: Once | ORAL | Status: AC
  Administered 2019-07-21: 30 mL via ORAL
  Filled 2019-07-21: qty 30

## 2019-07-21 MED ORDER — LIDOCAINE VISCOUS HCL 2 % MT SOLN
15.0000 mL | Freq: Once | OROMUCOSAL | Status: AC
  Administered 2019-07-21: 15 mL via ORAL
  Filled 2019-07-21: qty 15

## 2019-07-21 MED FILL — PANTOPRAZOLE SOD DR 20 MG T: 20 | 30 days supply | Qty: 30 | Fill #0

## 2019-07-21 NOTE — ED Provider Notes (Signed)
[facility] EMERGENCY DEPARTMENT Provider Note   CSN: [id number] Arrival date & time: 07/21/19  0448     History Chief Complaint  Patient presents with  . Chest Pain    Diana Joyce is a 29 y.o. adult who presents to the emergency department with a chief complaint of chest pain.  He states that he has been dealing with some reflux over the past few days and has associated water brash.  He states that his Victoza makes him nauseated and he is also currently menstruating which can cause him to be mildly nauseous as well.  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  He states that he felt extremely nervous.  He has had repeat bouts of this left-sided chest pain over the past several hours.  He never had anything like it before.  He is a smoker.  He is currently on oral OCPs for menorrhagia.  He denies any hemoptysis, unilateral leg swelling or pain.  HPI  HPI: A 29 year old patient with a history of treated diabetes and obesity presents for evaluation of chest pain. Initial onset of pain was less than one hour ago. The patient's chest pain is well-localized, is sharp and is not worse with exertion. The patient's chest pain is middle- or left-sided, is not described as heaviness/pressure/tightness and does not radiate to the arms/jaw/neck. The patient does not complain of nausea and denies diaphoresis. The patient has no history of stroke, has no history of peripheral artery disease, has not smoked in the past 90 days, has no relevant family history of coronary artery disease (first degree relative at less than age 27), is not hypertensive and has no history of hypercholesterolemia.   Past Medical History:  Diagnosis Date  . Apnea, sleep   . Asthma   . Diabetes [facility])     Patient Active Problem List   Diagnosis Date Noted  . Sleep apnea 12/07/2017  . Class 3  severe obesity due to excess calories without serious comorbidity with body mass index (BMI) of 50.0 to 59.9 in adult ([place]) 06/19/2017  . Diabetes [facility])     Past Surgical History:  Procedure Laterality Date  . FRACTURE SURGERY       OB History   No obstetric history on file.     Family History  Problem Relation Age of Onset  . Kidney failure Mother   . Hypertension Mother   . Diabetes Mother   . Heart failure Mother   . Diabetes Father     Social History   Tobacco Use  . Smoking status: Current Every Day Smoker    Types: Cigars  . Smokeless tobacco: Never Used  Substance Use Topics  . Alcohol use: Yes  . Drug use: No    Home Medications Prior to Admission medications   Medication Sig Start Date End Date Taking? Authorizing Provider  albuterol (VENTOLIN HFA) 108 (90 Base) MCG/ACT inhaler Inhale 2 puffs into the lungs every 6 (six) hours as needed for wheezing or shortness of breath. 06/17/19   Charlott Rakes, MD  amoxicillin (AMOXIL) 500 MG capsule Take 1 capsule (500 mg total) by mouth 3 (three) times daily. Patient not taking: Reported on 07/13/2019 06/16/19   Argentina Donovan, PA-C  blood glucose meter kit and supplies KIT Dispense with test strips and lancets based on insurance preference. Use up to four times daily  as directed. (FOR ICD-9 250.00, 250.01). 12/07/17   Charlott Rakes, MD  Blood Glucose Monitoring Suppl (TRUE METRIX METER) w/Device KIT Check blood sugars fasting and at bedtime daily 05/07/17   Freeman Caldron M, PA-C  glipiZIDE (GLUCOTROL XL) 10 MG 24 hr tablet Take 1 tablet (10 mg total) by mouth daily with breakfast. 07/13/19   Charlott Rakes, MD  glucose blood (TRUE METRIX BLOOD GLUCOSE TEST) test strip Use as instructed 10/12/18   Charlott Rakes, MD  insulin glargine (LANTUS) 100 UNIT/ML injection Inject 0.4 mLs (40 Units total) into the skin at bedtime. 07/13/19 11/23/19  Charlott Rakes, MD  Insulin Pen Needle (PEN NEEDLES) 32G X 4 MM MISC 1 Dose by Does not  apply route daily. 04/13/19   Charlott Rakes, MD  liraglutide (VICTOZA) 18 MG/3ML SOPN Start 0.25m SQ once a day for 7 days, then increase to 1.270monce a day then 1.72m472mhereafter 07/13/19   NewCharlott RakesD  Norgestimate-Ethinyl Estradiol Triphasic (ORTHO TRI-CYCLEN LO) 0.18/0.215/0.25 MG-25 MCG tab Take 1 tablet by mouth daily. Patient not taking: Reported on 10/12/2018 07/13/18   NewCharlott RakesD  TRUEPLUS LANCETS 28G MISC Check blood sugars fasting and at bedtime 05/07/17   McCArgentina DonovanA-C    Allergies    Benadryl [diphenhydramine hcl]  Review of Systems   Review of Systems Ten systems reviewed and are negative for acute change, except as noted in the HPI.   Physical Exam Updated Vital Signs BP (!) 142/71   Pulse 91   Temp 98.5 F (36.9 C) (Oral)   Resp (!) 30   Wt (!) 140.2 kg   SpO2 100%   BMI 51.43 kg/m   Physical Exam Vitals and nursing note reviewed.  Constitutional:      General: He is not in acute distress.    Appearance: He is well-developed. He is not diaphoretic.  HENT:     Head: Normocephalic and atraumatic.  Eyes:     General: No scleral icterus.    Conjunctiva/sclera: Conjunctivae normal.  Cardiovascular:     Rate and Rhythm: Normal rate and regular rhythm.     Heart sounds: Normal heart sounds. No murmur. No friction rub. No gallop.   Pulmonary:     Effort: Pulmonary effort is normal. No respiratory distress.     Breath sounds: Normal breath sounds.  Chest:    Abdominal:     General: Bowel sounds are normal. There is no distension.     Palpations: Abdomen is soft. There is no mass.     Tenderness: There is no abdominal tenderness. There is no guarding.  Musculoskeletal:     Cervical back: Normal range of motion.  Skin:    General: Skin is warm and dry.  Neurological:     Mental Status: He is alert and oriented to person, place, and time.  Psychiatric:        Behavior: Behavior normal.     ED Results / Procedures / Treatments     Labs (all labs ordered are listed, but only abnormal results are displayed) Labs Reviewed  CBC - Abnormal; Notable for the following components:      Result Value   WBC 10.6 (*)    Hemoglobin 11.3 (*)    MCV 76.0 (*)    MCH 23.6 (*)    Platelets 415 (*)    All other components within normal limits  BASIC METABOLIC PANEL  TROPONIN I (HIGH SENSITIVITY)  TROPONIN I (HIGH SENSITIVITY)    EKG EKG  Interpretation  Date/Time:  Thursday July 21 2019 05:01:44 EST Ventricular Rate:  87 PR Interval:    QRS Duration: 92 QT Interval:  372 QTC Calculation: 448 R Axis:   69 Text Interpretation: Sinus rhythm No significant change since last tracing Confirmed by Ripley Fraise [phone]) on 07/21/2019 5:12:55 AM   Radiology DG Chest Port 1 View  Result Date: 07/21/2019 CLINICAL DATA:  Chest pain and shortness of breath EXAM: PORTABLE CHEST 1 VIEW COMPARISON:  06/07/2019 FINDINGS: Normal heart size and mediastinal contours. No acute infiltrate or edema. No effusion or pneumothorax. No acute osseous findings. IMPRESSION: No active disease. Electronically Signed   By: Monte Fantasia M.D.   On: 07/21/2019 05:32    Procedures Procedures (including critical care time)  Medications Ordered in ED Medications  alum & mag hydroxide-simeth (MAALOX/MYLANTA) 200-200-20 MG/5ML suspension 30 mL (30 mLs Oral Given 07/21/19 0516)    And  lidocaine (XYLOCAINE) 2 % viscous mouth solution 15 mL (15 mLs Oral Given 07/21/19 0516)    ED Course  I have reviewed the triage vital signs and the nursing notes.  Pertinent labs & imaging results that were available during my care of the patient were reviewed by me and considered in my medical decision making (see chart for details).    MDM Rules/Calculators/A&P HEAR Score: 1                    CC: Chest pain VS:  Vitals:   07/21/19 0600 07/21/19 0630 07/21/19 0700 07/21/19 0705  BP: (!) 143/77 (!) 149/77 (!) 142/71   Pulse: 82 85 91   Resp: 19 (!) 28  (!) 30   Temp:    98.5 F (36.9 C)  TempSrc:    Oral  SpO2: 100% 100% 100%   Weight:        BO:FBPZWCH is gathered by patient and EMR. DDX: Differential diagnosis for chest pain is broad and requires complex medical decision making The emergent differential diagnosis of chest pain includes: Acute coronary syndrome, pericarditis, aortic dissection, pulmonary embolism, tension pneumothorax, pneumonia, and esophageal rupture.  Labs: I reviewed the labs which show BMP shows no acute abnormalities.  Glucose within normal limits.  CBC shows elevated white blood cell count of insignificant value, mild microcytic anemia.  Mild thrombocytosis of insignificant value.  Initial troponin within normal limits and second is pending. Imaging: I personally reviewed the images (1 view chest x-ray) which show(s) no acute abnormalities on my interpretation EKG: Normal sinus rhythm at a rate of 87 MDM: Patient here with chest pain.  She has sharp chest pain only lasting about 5 seconds at a time.  Is not persistent.  She denies shortness of breath, presyncope, hemoptysis, unilateral leg swelling.  Patient is on OCPs however given her vital signs with low heart rate, no tachypnea, no tachycardia very low suspicion for PE.  Her  heart score is 1 but algorithm require second troponin.  I given signout to [place] who will assume care of the patient.      Final Clinical Impression(s) / ED Diagnoses Final diagnoses:  None    Rx / DC Orders ED Discharge Orders    None       Margarita Mail, PA-C 07/21/19 8527    Palumbo, April, MD 07/21/19 2309

## 2019-07-21 NOTE — ED Notes (Signed)
IV has been taken out 

## 2019-07-21 NOTE — ED Notes (Signed)
Patient verbalizes understanding of discharge instructions. Opportunity for questioning and answers were provided. Armband removed by staff, pt discharged from ED. Ambulated out to lobby  

## 2019-07-21 NOTE — ED Provider Notes (Signed)
Care handoff received from Margarita Mail, PA-C at shift change please see her note for full details.  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  Brief intermittent sharp chest pain.  Reproducible on exam.  Initial work-up is reassuring and heart score is low.  Delta troponin currently pending.  Plan of care is to follow-up on pending lab work and reassess.  High-sensitivity troponin within normal limits CBC mild leukocytosis of 10.6 BMP within normal limits Chest x-ray:  IMPRESSION:  No active disease.   EKG:   Sinus rhythm No significant change since last tracing Confirmed by Ripley Fraise [phone]) on 07/21/2019 5:12:55 AM Physical Exam  BP (!) 142/71   Pulse 98   Temp 98.5 F (36.9 C) (Oral)   Resp 17   Wt (!) 140.2 kg   SpO2 100%   BMI 51.43 kg/m   Physical Exam Constitutional:      General: He is not in acute distress.    Appearance: Normal appearance. He is well-developed. He is obese. He is not ill-appearing or diaphoretic.  HENT:     Head: Normocephalic and atraumatic.     Right Ear: External ear normal.     Left Ear: External ear normal.     Nose: Nose normal.  Eyes:     General: Vision grossly intact. Gaze aligned appropriately.     Pupils: Pupils are equal, round, and reactive to light.  Neck:     Trachea: Trachea and phonation normal. No tracheal deviation.  Cardiovascular:     Rate and Rhythm: Normal rate and regular rhythm.  Pulmonary:     Effort: Pulmonary effort is normal. No respiratory distress.     Breath sounds: Normal breath sounds.  Chest:     Chest wall: Tenderness present.  Abdominal:     General: There is no distension.     Palpations: Abdomen is soft.     Tenderness: There is no abdominal tenderness. There is no guarding or rebound.  Musculoskeletal:        General: Normal range of motion.     Cervical back: Normal range of motion.  Skin:    General: Skin is warm and dry.  Neurological:     Mental  Status: He is alert.     GCS: GCS eye subscore is 4. GCS verbal subscore is 5. GCS motor subscore is 6.     Comments: Speech is clear and goal oriented, follows commands Major Cranial nerves without deficit, no facial droop Moves extremities without ataxia, coordination intact  Psychiatric:        Behavior: Behavior normal.     ED Course/Procedures     Procedures  MDM  7:10 AM: Patient reassessed resting comfortably no acute distress.  Patient reports symptoms had improved following GI cocktail but feels they have returned since that time.  On exam patient is mildly tachycardic heart rate 104, no hypoxia, patient is anxious appearing.  Based on OCP use and new tachycardia will obtain D-dimer for evaluation of pulmonary embolism along with second troponin. - Delta high-sensitivity troponin within normal limits D-dimer within normal limits Beta HcG negative - Imaging, EKG and labs personally reviewed and interpreted.  With 2 troponins within normal limits reassuring EKG and history doubt this to be ACS, heart score less than 4.  D-dimer within normal limits, no hypoxia and tachycardia resolved once I left the room, no evidence for pulmonary embolism at this time.  Additionally history and work-up not consistent with dissection,  infection or other acute cardiopulmonary etiology of patient's pain today.  Possible reflux disease versus musculoskeletal and likely component of anxiety.  We will treat with Protonix and referred to PCP for reevaluation.  There is no indication for further work-up in the ER at this time.  Additionally abdomen is soft nontender without peritoneal signs Murphy sign.  No evidence for intra-abdominal etiology of patient's symptoms today. - 9:05 AM: Patient updated on work-up as above and states understanding.  Patient is agreeable for discharge with Protonix, no further questions or concerns at this time.    At this time there does not appear to be any evidence of an  acute emergency medical condition and the patient appears stable for discharge with appropriate outpatient follow up. Diagnosis was discussed with patient who verbalizes understanding of care plan and is agreeable to discharge. I have discussed return precautions with patient who verbalizes understanding of return precautions. Patient encouraged to follow-up with their PCP. All questions answered. Patient has been discharged in good condition.  Patient's case discussed with Dr. Particia Nearing who agrees with plan to discharge with follow-up.   Note: Portions of this report may have been transcribed using voice recognition software. Every effort was made to ensure accuracy; however, inadvertent computerized transcription errors may still be present.   Elizabeth Palau 07/21/19 1008    Jacalyn Lefevre, MD 07/21/19 1038

## 2019-07-21 NOTE — ED Triage Notes (Signed)
Pt in with c/o L sided burning cp, started at 0300. Said she was at work when the pain began, has been dealing w/indigestion. Denies any cough, n/v or sob

## 2019-07-21 NOTE — Discharge Instructions (Addendum)
You have been diagnosed today with Atypical Chest Pain.  At this time there does not appear to be the presence of an emergent medical condition, however there is always the potential for conditions to change. Please read and follow the below instructions.  Please return to the Emergency Department immediately for any new or worsening symptoms. Please be sure to follow up with your Primary Care Provider within one week regarding your visit today; please call their office to schedule an appointment even if you are feeling better for a follow-up visit. You may take the medication Protonix as prescribed to help with your symptoms.  Please drink plenty of water and get plenty of rest.  Follow-up with your primary care provider this week for reassessment.  Get help right away if: Your chest pain is worse. You have a cough that gets worse, or you cough up blood. You have very bad (severe) pain in your belly (abdomen). You pass out (faint). You have either of these for no clear reason: Sudden chest discomfort. Sudden discomfort in your arms, back, neck, or jaw. You have shortness of breath at any time. You suddenly start to sweat, or your skin gets clammy. You feel sick to your stomach (nauseous). You throw up (vomit). You suddenly feel lightheaded or dizzy. You feel very weak or tired. Your heart starts to beat fast, or it feels like it is skipping beats. You have any new/concerning or worsening of symptoms  Please read the additional information packets attached to your discharge summary.  Do not take your medicine if  develop an itchy rash, swelling in your mouth or lips, or difficulty breathing; call 911 and seek immediate emergency medical attention if this occurs.  Note: Portions of this text may have been transcribed using voice recognition software. Every effort was made to ensure accuracy; however, inadvertent computerized transcription errors may still be present.

## 2019-07-25 MED FILL — TESTOSTERONE CYP 200 MG/ML: 200 | 28 days supply | Qty: 4 | Fill #0

## 2019-08-03 MED FILL — TRUEPLUS PEN NDL 32GX5/32: 32G X 4 MM | 25 days supply | Qty: 100 | Fill #0

## 2019-08-03 MED FILL — VICTOZA 18 MG/3 ML INJECT P: 18 | 30 days supply | Qty: 9 | Fill #0

## 2019-08-03 MED FILL — TRUEPLUS PEN NDL 32GX5/32": 32G X 4 MM | 25 days supply | Qty: 100 | Fill #0

## 2019-08-19 MED FILL — glipiZIDE XL 10 MG TB24: 10 | 30 days supply | Qty: 30 | Fill #1

## 2019-08-19 MED FILL — $LANTUS 100 UNITS/ML VIAL: 100 | 25 days supply | Qty: 10 | Fill #1

## 2019-09-15 MED FILL — glipiZIDE XL 10 MG TB24: 10 | 30 days supply | Qty: 30 | Fill #2

## 2019-09-15 MED FILL — VICTOZA 18 MG/3 ML INJECT P: 18 | 30 days supply | Qty: 9 | Fill #1

## 2019-09-15 MED FILL — TRUE METRIX TEST STRIP: 30 days supply | Qty: 100 | Fill #3

## 2019-09-15 MED FILL — $LANTUS 100 UNITS/ML VIAL: 100 | 25 days supply | Qty: 10 | Fill #2

## 2019-10-12 ENCOUNTER — Other Ambulatory Visit: Payer: Self-pay

## 2019-10-12 ENCOUNTER — Encounter: Payer: Self-pay | Admitting: Family Medicine

## 2019-10-12 ENCOUNTER — Other Ambulatory Visit: Payer: Self-pay | Admitting: Family Medicine

## 2019-10-12 ENCOUNTER — Ambulatory Visit: Payer: Self-pay | Attending: Family Medicine | Admitting: Family Medicine

## 2019-10-12 VITALS — BP 134/83 | HR 104 | Ht 65.0 in | Wt 317.6 lb

## 2019-10-12 DIAGNOSIS — N939 Abnormal uterine and vaginal bleeding, unspecified: Secondary | ICD-10-CM

## 2019-10-12 DIAGNOSIS — E1165 Type 2 diabetes mellitus with hyperglycemia: Secondary | ICD-10-CM

## 2019-10-12 DIAGNOSIS — Z79899 Other long term (current) drug therapy: Secondary | ICD-10-CM

## 2019-10-12 DIAGNOSIS — R61 Generalized hyperhidrosis: Secondary | ICD-10-CM

## 2019-10-12 DIAGNOSIS — Z794 Long term (current) use of insulin: Secondary | ICD-10-CM

## 2019-10-12 DIAGNOSIS — J45909 Unspecified asthma, uncomplicated: Secondary | ICD-10-CM

## 2019-10-12 DIAGNOSIS — Z6841 Body Mass Index (BMI) 40.0 and over, adult: Secondary | ICD-10-CM

## 2019-10-12 LAB — POCT GLYCOSYLATED HEMOGLOBIN (HGB A1C): HbA1c, POC (controlled diabetic range): 8.7 % — AB (ref 0.0–7.0)

## 2019-10-12 LAB — GLUCOSE, POCT (MANUAL RESULT ENTRY): POC Glucose: 104 mg/dl — AB (ref 70–99)

## 2019-10-12 MED ORDER — NOVOLOG FLEXPEN 100 UNIT/ML ~~LOC~~ SOPN: PEN_INJECTOR | SUBCUTANEOUS | 6 refills | Status: DC

## 2019-10-12 MED ORDER — ALBUTEROL SULFATE HFA 108 (90 BASE) MCG/ACT IN AERS: 2.0000 | INHALATION_SPRAY | Freq: Four times a day (QID) | RESPIRATORY_TRACT | 2 refills | Status: DC | PRN

## 2019-10-12 MED ORDER — DRYSOL 20 % EX SOLN: Freq: Every day | CUTANEOUS | 1 refills | Status: DC

## 2019-10-12 MED FILL — ALBUTEROL SULFATE HFA 108 (: 108 (90 BAS | 25 days supply | Qty: 18 | Fill #0

## 2019-10-12 MED FILL — DRYSOL DAB-O-MATIC SOLUTION: 20 | 20 days supply | Qty: 35 | Fill #0

## 2019-10-12 MED FILL — ?HUMALOG 100 UNITS/ML KWIKP: 100 | 25 days supply | Qty: 9 | Fill #0

## 2019-10-12 NOTE — Progress Notes (Signed)
Subjective:  Patient ID: Diana Joyce, adult    DOB: 1990/11/20  Age: 29 y.o. MRN: [id number]  CC: Diabetes   HPI Diana Joyce  is a 29 year old [facility]) with type 2 diabetes mellitus (A1c 9.7), asthma, morbid obesity here for follow-up visit She started testosterone 07/25/19.  Her A1c is 8.7 which is down from 9.7 previously.  Fasting sugars are about 100 and she has no episodes of hypoglycemia and random sugars are rarely above 200.  Endorses compliance with her medications.  Yet to undergo annual eye exam. Her weight goal is to be under 300  She had an abnormal period from 4/4-4/19 but was previously for 7 days and regular.  She had been previously informed she would have abnormal menstrual cycles with testosterone therapy. 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 Also complains of excessive sweating from waist down and of note she has gained 8 pounds since commencing testosterone "when she started on testosterone. Her asthma is stable with no flares. Past Medical History:  Diagnosis Date  . Apnea, sleep   . Asthma   . Diabetes [facility])     Past Surgical History:  Procedure Laterality Date  . FRACTURE SURGERY      Family History  Problem Relation Age of Onset  . Kidney failure Mother   . Hypertension Mother   . Diabetes Mother   . Heart failure Mother   . Diabetes Father     Allergies  Allergen Reactions  . Benadryl [Diphenhydramine Hcl] Other (See Comments)    Hives, coughing, itching.     Outpatient Medications Prior to Visit  Medication Sig Dispense Refill  . albuterol (VENTOLIN HFA) 108 (90 Base) MCG/ACT inhaler Inhale 2 puffs into the lungs every 6 (six) hours as needed for wheezing or shortness of breath. 18 g 2  . blood glucose meter kit and supplies KIT Dispense with test strips and lancets based on insurance preference. Use up to four times  daily as directed. (FOR ICD-9 250.00, 250.01). 100 each 12  . Blood Glucose Monitoring Suppl (TRUE METRIX METER) w/Device KIT Check blood sugars fasting and at bedtime daily 1 kit 1  . glipiZIDE (GLUCOTROL XL) 10 MG 24 hr tablet Take 1 tablet (10 mg total) by mouth daily with breakfast. 30 tablet 6  . glucose blood (TRUE METRIX BLOOD GLUCOSE TEST) test strip Use as instructed 100 each 12  . insulin glargine (LANTUS) 100 UNIT/ML injection Inject 0.4 mLs (40 Units total) into the skin at bedtime. 30 mL 6  . Insulin Pen Needle (PEN NEEDLES) 32G X 4 MM MISC 1 Dose by Does not apply route daily. 100 each 1  . liraglutide (VICTOZA) 18 MG/3ML SOPN Start 0.52m SQ once a day for 7 days, then increase to 1.269monce a day then 1.94m25mhereafter 3 pen 6  . pantoprazole (PROTONIX) 20 MG tablet Take 1 tablet (20 mg total) by mouth daily. 30 tablet 0  . TRUEPLUS LANCETS 28G MISC Check blood sugars fasting and at bedtime 100 each 1  . amoxicillin (AMOXIL) 500 MG capsule Take 1 capsule (500 mg total) by mouth 3 (three) times daily. (Patient not taking: Reported on 07/13/2019) 30 capsule 0  . Norgestimate-Ethinyl Estradiol Triphasic (ORTHO TRI-CYCLEN LO) 0.18/0.215/0.25 MG-25 MCG tab Take 1 tablet by mouth daily. (Patient not taking: Reported on 10/12/2018) 1 Package 11   No facility-administered medications prior to visit.  ROS Review of Systems  Constitutional: Negative for activity change, appetite change and fatigue.  HENT: Negative for congestion, sinus pressure and sore throat.   Eyes: Negative for visual disturbance.  Respiratory: Negative for cough, chest tightness, shortness of breath and wheezing.   Cardiovascular: Negative for chest pain and palpitations.  Gastrointestinal: Negative for abdominal distention, abdominal pain and constipation.  Endocrine: Negative for polydipsia.  Genitourinary: Negative for dysuria and frequency.  Musculoskeletal: Negative for arthralgias and back pain.  Skin:  Negative for rash.  Neurological: Negative for tremors, light-headedness and numbness.  Hematological: Does not bruise/bleed easily.  Psychiatric/Behavioral: Negative for agitation and behavioral problems.    Objective:  BP 134/83   Pulse (!) 104   Ht 5' 5"  (1.651 m)   Wt (!) 317 lb 9.6 oz (144.1 kg)   SpO2 99%   BMI 52.85 kg/m   BP/Weight 10/12/2019 3/61/4431 10/10/84  Systolic BP 761 950 932  Diastolic BP 83 71 84  Wt. (Lbs) 317.6 309.09 309  BMI 52.85 51.43 51.42      Physical Exam Constitutional:      Appearance: He is well-developed. He is obese.  Neck:     Vascular: No JVD.  Cardiovascular:     Rate and Rhythm: Normal rate.     Heart sounds: Normal heart sounds. No murmur.  Pulmonary:     Effort: Pulmonary effort is normal.     Breath sounds: Normal breath sounds. No wheezing or rales.  Chest:     Chest wall: No tenderness.  Abdominal:     General: Bowel sounds are normal. There is no distension.     Palpations: Abdomen is soft. There is no mass.     Tenderness: There is no abdominal tenderness.  Musculoskeletal:        General: Normal range of motion.     Right lower leg: No edema.     Left lower leg: No edema.  Neurological:     Mental Status: He is alert and oriented to person, place, and time.  Psychiatric:        Mood and Affect: Mood normal.     CMP Latest Ref Rng & Units 07/21/2019 07/13/2019 06/07/2019  Glucose 70 - 99 mg/dL 94 115(H) 83  BUN 6 - 20 mg/dL 6 9 6   Creatinine 0.44 - 1.00 mg/dL 0.60 0.74 0.57  Sodium 135 - 145 mmol/L 137 142 138  Potassium 3.5 - 5.1 mmol/L 3.7 4.1 3.6  Chloride 98 - 111 mmol/L 103 104 102  CO2 22 - 32 mmol/L 25 23 27   Calcium 8.9 - 10.3 mg/dL 8.9 8.9 8.4(L)  Total Protein 6.0 - 8.5 g/dL - 7.2 -  Total Bilirubin 0.0 - 1.2 mg/dL - 0.3 -  Alkaline Phos 39 - 117 IU/L - 115 -  AST 0 - 40 IU/L - 39 -  ALT 0 - 32 IU/L - 41(H) -    Lipid Panel     Component Value Date/Time   CHOL 153 07/13/2019 1105   TRIG 102  07/13/2019 1105   HDL 40 07/13/2019 1105   CHOLHDL 3.8 07/13/2019 1105   LDLCALC 94 07/13/2019 1105    CBC    Component Value Date/Time   WBC 10.6 (H) 07/21/2019 0507   RBC 4.79 07/21/2019 0507   HGB 11.3 (L) 07/21/2019 0507   HCT 36.4 07/21/2019 0507   PLT 415 (H) 07/21/2019 0507   MCV 76.0 (L) 07/21/2019 0507   MCH 23.6 (L) 07/21/2019 0507   MCHC 31.0  07/21/2019 0507   RDW 15.3 07/21/2019 0507   LYMPHSABS 2.7 06/07/2019 1920   MONOABS 0.6 06/07/2019 1920   EOSABS 0.1 06/07/2019 1920   BASOSABS 0.0 06/07/2019 1920   Lab Results  Component Value Date   HGBA1C 8.7 (A) 10/12/2019     Assessment & Plan:  1. Type 2 diabetes mellitus with hyperglycemia, with long-term current use of insulin (HCC) Uncontrolled with A1c of 8.7 NovoLog sliding scale added to regimen Holding off on increasing Lantus dose to prevent hypoglycemia as her fasting sugars are at goal. - POCT glucose (manual entry) - POCT glycosylated hemoglobin (Hb A1C) - CMP14+EGFR - Lipid panel - insulin aspart (NOVOLOG FLEXPEN) 100 UNIT/ML FlexPen; 0 to 12 units 3 times daily before meals as per sliding scale  Dispense: 15 mL; Refill: 6  2. Class 3 severe obesity due to excess calories without serious comorbidity with body mass index (BMI) of 50.0 to 59.9 in adult [facility]) We have discussed caloric restriction and increasing physical activity to more than just walking her dog with a goal of weight loss She is also on testosterone which places her at increased risk of obesity and other comorbidities.  3. Abnormal uterine bleeding This occurred on one occasion last month She has been advised to keep a symptom diary Use of testosterone could also be contributory If symptoms persist will consider oral contraceptive therapy  4. Excessive sweating Could also be secondary to weight gain and testosterone levels - aluminum chloride (DRYSOL) 20 % external solution; Apply topically at bedtime.  Dispense: 35 mL; Refill:  1  5. Uncomplicated asthma, unspecified asthma severity, unspecified whether persistent Stable - albuterol (VENTOLIN HFA) 108 (90 Base) MCG/ACT inhaler; Inhale 2 puffs into the lungs every 6 (six) hours as needed for wheezing or shortness of breath.  Dispense: 18 g; Refill: 2   6.  High risk medication use Discussed risk of testosterone use with her -[facility] from [facility] We will check labs today, CBC at next visit.  Charlott Rakes, MD, FAAFP. [facility] and [place], [place]   10/12/2019, 8:46 AM

## 2019-10-12 NOTE — Progress Notes (Signed)
Patient is concerned about menstrual cycle it was on for two weeks last month.

## 2019-10-13 LAB — CMP14+EGFR
ALT: 17 IU/L (ref 0–32)
AST: 19 IU/L (ref 0–40)
Albumin/Globulin Ratio: 1.1 — ABNORMAL LOW (ref 1.2–2.2)
Albumin: 3.9 g/dL (ref 3.9–5.0)
Alkaline Phosphatase: 101 IU/L (ref 39–117)
BUN/Creatinine Ratio: 13 (ref 9–23)
BUN: 8 mg/dL (ref 6–20)
Bilirubin Total: 0.3 mg/dL (ref 0.0–1.2)
CO2: 21 mmol/L (ref 20–29)
Calcium: 9.2 mg/dL (ref 8.7–10.2)
Chloride: 97 mmol/L (ref 96–106)
Creatinine, Ser: 0.62 mg/dL (ref 0.57–1.00)
GFR calc Af Amer: 143 mL/min/{1.73_m2} (ref >59)
GFR calc non Af Amer: 124 mL/min/{1.73_m2} (ref >59)
Globulin, Total: 3.4 g/dL (ref 1.5–4.5)
Glucose: 82 mg/dL (ref 65–99)
Potassium: 4 mmol/L (ref 3.5–5.2)
Sodium: 138 mmol/L (ref 134–144)
Total Protein: 7.3 g/dL (ref 6.0–8.5)

## 2019-10-13 LAB — LIPID PANEL
Chol/HDL Ratio: 5.4 ratio — ABNORMAL HIGH (ref 0.0–4.4)
Cholesterol, Total: 150 mg/dL (ref 100–199)
HDL: 28 mg/dL — ABNORMAL LOW
LDL Chol Calc (NIH): 94 mg/dL (ref 0–99)
Triglycerides: 155 mg/dL — ABNORMAL HIGH (ref 0–149)
VLDL Cholesterol Cal: 28 mg/dL (ref 5–40)

## 2019-10-24 MED FILL — ?HUMALOG 100 UNITS/ML KWIKP: 100 | 25 days supply | Qty: 9 | Fill #0

## 2019-10-24 MED FILL — ALBUTEROL SULFATE HFA 108 (: 108 (90 BAS | 25 days supply | Qty: 18 | Fill #0

## 2019-10-24 MED FILL — DRYSOL DAB-O-MATIC SOLUTION: 20 | 20 days supply | Qty: 35 | Fill #0

## 2019-10-31 MED FILL — BD NEEDLES 25GX0.625: 25G X 5/8" | 20 days supply | Qty: 20 | Fill #0

## 2019-10-31 MED FILL — BD 3 ML SYRINGE 18GX1-1/2: 18G X 1-1/2 | 20 days supply | Qty: 20 | Fill #0

## 2019-10-31 MED FILL — TESTOSTERONE CYP 200 MG/ML: 200 | 28 days supply | Qty: 4 | Fill #0

## 2019-11-08 MED FILL — TESTOSTERONE CYP 200 MG/ML: 200 | 30 days supply | Qty: 4 | Fill #0

## 2019-12-25 ENCOUNTER — Other Ambulatory Visit: Payer: Self-pay

## 2019-12-25 ENCOUNTER — Emergency Department (HOSPITAL_BASED_OUTPATIENT_CLINIC_OR_DEPARTMENT_OTHER)
Admission: EM | Admit: 2019-12-25 | Discharge: 2019-12-26 | Disposition: A | Discharge status: Home / Self Care | Payer: Self-pay | Attending: Emergency Medicine | Admitting: Emergency Medicine

## 2019-12-25 DIAGNOSIS — R519 Headache, unspecified: Secondary | ICD-10-CM | POA: Insufficient documentation

## 2019-12-25 DIAGNOSIS — Z794 Long term (current) use of insulin: Secondary | ICD-10-CM | POA: Insufficient documentation

## 2019-12-25 DIAGNOSIS — E119 Type 2 diabetes mellitus without complications: Secondary | ICD-10-CM | POA: Insufficient documentation

## 2019-12-25 DIAGNOSIS — J45909 Unspecified asthma, uncomplicated: Secondary | ICD-10-CM | POA: Insufficient documentation

## 2019-12-25 DIAGNOSIS — F17290 Nicotine dependence, other tobacco product, uncomplicated: Secondary | ICD-10-CM | POA: Insufficient documentation

## 2019-12-25 DIAGNOSIS — Z79899 Other long term (current) drug therapy: Secondary | ICD-10-CM | POA: Insufficient documentation

## 2019-12-25 LAB — CBG MONITORING, ED: Glucose-Capillary: 236 mg/dL — ABNORMAL HIGH (ref 70–99)

## 2019-12-25 NOTE — ED Triage Notes (Signed)
Reports headache and eye pain since 2000, states light sensitivity.

## 2019-12-26 ENCOUNTER — Emergency Department (HOSPITAL_BASED_OUTPATIENT_CLINIC_OR_DEPARTMENT_OTHER): Payer: Self-pay

## 2019-12-26 ENCOUNTER — Encounter (HOSPITAL_BASED_OUTPATIENT_CLINIC_OR_DEPARTMENT_OTHER): Payer: Self-pay | Admitting: Emergency Medicine

## 2019-12-26 LAB — PREGNANCY, URINE: Preg Test, Ur: NEGATIVE

## 2019-12-26 MED ORDER — KETOROLAC TROMETHAMINE 60 MG/2ML IM SOLN
60.0000 mg | Freq: Once | INTRAMUSCULAR | Status: AC
  Administered 2019-12-26: 60 mg via INTRAMUSCULAR
  Filled 2019-12-26: qty 2

## 2019-12-26 MED ORDER — DIVALPROEX SODIUM 500 MG PO DR TAB
500.0000 mg | DELAYED_RELEASE_TABLET | Freq: Once | ORAL | Status: DC
  Filled 2019-12-26: qty 1

## 2019-12-26 MED ORDER — DIVALPROEX SODIUM ER 250 MG PO TB24
ORAL_TABLET | ORAL | Status: AC
  Administered 2019-12-26: 500 mg
  Filled 2019-12-26: qty 2

## 2019-12-26 MED ORDER — DEXAMETHASONE SODIUM PHOSPHATE 4 MG/ML IJ SOLN
4.0000 mg | Freq: Once | INTRAMUSCULAR | Status: AC
  Administered 2019-12-26: 4 mg via INTRAMUSCULAR
  Filled 2019-12-26: qty 1

## 2019-12-26 MED ORDER — FLUCONAZOLE 150 MG PO TABS
150.0000 mg | ORAL_TABLET | Freq: Once | ORAL | Status: AC
  Administered 2019-12-26: 150 mg via ORAL
  Filled 2019-12-26: qty 1

## 2019-12-26 MED ORDER — TETRACAINE HCL 0.5 % OP SOLN
OPHTHALMIC | Status: AC
  Filled 2019-12-26: qty 4

## 2019-12-26 NOTE — ED Provider Notes (Signed)
[facility] EMERGENCY DEPARTMENT Provider Note   CSN: [id number] Arrival date & time: 12/25/19  2203     History Chief Complaint  Patient presents with  . Headache    Diana Joyce is a 29 y.o. adult.  The history is provided by the patient.  Migraine This is a new problem. The current episode started 6 to 12 hours ago. The problem occurs constantly. The problem has not changed since onset.Associated symptoms include headaches. Pertinent negatives include no chest pain, no abdominal pain and no shortness of breath. Nothing aggravates the symptoms. Nothing relieves the symptoms. He has tried nothing for the symptoms. The treatment provided moderate relief.  Patient reports headache and eye pain around the eye.  Not sudden onset.  No decreased vision.  No changes in in speech, no proptosis.  No changes in cognition.  No weakness no numbness.  No facial asymmetry.  Also has ongoing yeast infection.  Has had headaches in the past is not sure what brought this on but it is improved.  No emesis.       Past Medical History:  Diagnosis Date  . Apnea, sleep   . Asthma   . Diabetes [facility])     Patient Active Problem List   Diagnosis Date Noted  . Sleep apnea 12/07/2017  . Class 3 severe obesity due to excess calories without serious comorbidity with body mass index (BMI) of 50.0 to 59.9 in adult ([place]) 06/19/2017  . Diabetes [facility])     Past Surgical History:  Procedure Laterality Date  . FRACTURE SURGERY       OB History   No obstetric history on file.     Family History  Problem Relation Age of Onset  . Kidney failure Mother   . Hypertension Mother   . Diabetes Mother   . Heart failure Mother   . Diabetes Father     Social History   Tobacco Use  . Smoking status: Current Every Day Smoker    Types: Cigars  . Smokeless tobacco: Never Used  Substance Use Topics  . Alcohol use: Yes  . Drug use: No    Home Medications Prior to Admission medications     Medication Sig Start Date End Date Taking? Authorizing Provider  albuterol (VENTOLIN HFA) 108 (90 Base) MCG/ACT inhaler Inhale 2 puffs into the lungs every 6 (six) hours as needed for wheezing or shortness of breath. 10/12/19   Charlott Rakes, MD  aluminum chloride (DRYSOL) 20 % external solution Apply topically at bedtime. 10/12/19   Charlott Rakes, MD  amoxicillin (AMOXIL) 500 MG capsule Take 1 capsule (500 mg total) by mouth 3 (three) times daily. Patient not taking: Reported on 07/13/2019 06/16/19   Argentina Donovan, PA-C  blood glucose meter kit and supplies KIT Dispense with test strips and lancets based on insurance preference. Use up to four times daily as directed. (FOR ICD-9 250.00, 250.01). 12/07/17   Charlott Rakes, MD  Blood Glucose Monitoring Suppl (TRUE METRIX METER) w/Device KIT Check blood sugars fasting and at bedtime daily 05/07/17   Freeman Caldron M, PA-C  glipiZIDE (GLUCOTROL XL) 10 MG 24 hr tablet Take 1 tablet (10 mg total) by mouth daily with breakfast. 07/13/19   Charlott Rakes, MD  glucose blood (TRUE METRIX BLOOD GLUCOSE TEST) test strip Use as instructed 10/12/18   Charlott Rakes, MD  insulin aspart (NOVOLOG FLEXPEN) 100 UNIT/ML FlexPen 0 to 12 units 3 times daily before meals as per sliding scale 10/12/19   Newlin,  Enobong, MD  insulin glargine (LANTUS) 100 UNIT/ML injection Inject 0.4 mLs (40 Units total) into the skin at bedtime. 07/13/19 11/23/19  Charlott Rakes, MD  Insulin Pen Needle (PEN NEEDLES) 32G X 4 MM MISC 1 Dose by Does not apply route daily. 04/13/19   Charlott Rakes, MD  liraglutide (VICTOZA) 18 MG/3ML SOPN Start 0.310m SQ once a day for 7 days, then increase to 1.299monce a day then 1.10m67mhereafter 07/13/19   NewCharlott RakesD  Norgestimate-Ethinyl Estradiol Triphasic (ORTHO TRI-CYCLEN LO) 0.18/0.215/0.25 MG-25 MCG tab Take 1 tablet by mouth daily. Patient not taking: Reported on 10/12/2018 07/13/18   NewCharlott RakesD  pantoprazole (PROTONIX) 20 MG tablet Take 1  tablet (20 mg total) by mouth daily. 07/21/19   MorDeliah BostonA-C  TRUEPLUS LANCETS 28G MISC Check blood sugars fasting and at bedtime 05/07/17   McCArgentina DonovanA-C    Allergies    Benadryl [diphenhydramine hcl]  Review of Systems   Review of Systems  Constitutional: Negative for fever.  HENT: Negative for congestion.   Eyes: Negative for discharge, redness, itching and visual disturbance.  Respiratory: Negative for shortness of breath.   Cardiovascular: Negative for chest pain.  Gastrointestinal: Negative for abdominal pain.  Endocrine: Negative for polyuria.  Genitourinary: Negative for decreased urine volume.  Musculoskeletal: Negative for neck pain and neck stiffness. Gait problem:    Skin: Negative for rash.  Neurological: Positive for headaches. Negative for dizziness, facial asymmetry, speech difficulty, weakness and light-headedness.  Psychiatric/Behavioral: Negative for confusion and decreased concentration.  All other systems reviewed and are negative.   Physical Exam Updated Vital Signs BP 97/82 (BP Location: Right Arm)   Pulse 90   Temp 98.2 F (36.8 C) (Oral)   Resp 20   Ht 5' 5"  (1.651 m)   Wt (!) 140.6 kg   LMP 11/08/2019 (Approximate)   SpO2 100%   BMI 51.57 kg/m   Physical Exam Vitals and nursing note reviewed.  Constitutional:      General: He is not in acute distress.    Appearance: Normal appearance.  HENT:     Head: Normocephalic and atraumatic.     Nose: Nose normal.     Mouth/Throat:     Mouth: Mucous membranes are moist.     Pharynx: Oropharynx is clear.  Eyes:     Extraocular Movements: Extraocular movements intact.     Conjunctiva/sclera: Conjunctivae normal.     Pupils: Pupils are equal, round, and reactive to light.     Comments: No proptosis, intact cognition, disk margins sharp, EOMI.  Tonopen: 18 pressure right eye   Cardiovascular:     Rate and Rhythm: Normal rate and regular rhythm.     Pulses: Normal pulses.      Heart sounds: Normal heart sounds.  Pulmonary:     Effort: Pulmonary effort is normal.     Breath sounds: Normal breath sounds.  Abdominal:     General: Abdomen is flat.     Tenderness: There is no abdominal tenderness. There is no guarding or rebound.  Musculoskeletal:        General: Normal range of motion.     Cervical back: Normal range of motion and neck supple.  Skin:    General: Skin is warm and dry.     Capillary Refill: Capillary refill takes less than 2 seconds.  Neurological:     General: No focal deficit present.     Mental Status: He is alert and oriented to  person, place, and time.     Cranial Nerves: No cranial nerve deficit.     Deep Tendon Reflexes: Reflexes normal.  Psychiatric:        Mood and Affect: Mood normal.        Behavior: Behavior normal.     ED Results / Procedures / Treatments   Labs (all labs ordered are listed, but only abnormal results are displayed) Labs Reviewed  CBG MONITORING, ED - Abnormal; Notable for the following components:      Result Value   Glucose-Capillary 236 (*)    All other components within normal limits  PREGNANCY, URINE    EKG None  Radiology CT Head Wo Contrast  Result Date: 12/26/2019 CLINICAL DATA:  Right-sided headache. EXAM: CT HEAD WITHOUT CONTRAST TECHNIQUE: Contiguous axial images were obtained from the base of the skull through the vertex without intravenous contrast. COMPARISON:  None. FINDINGS: Brain: No evidence of acute infarction, hemorrhage, hydrocephalus, extra-axial collection or mass lesion/mass effect. Vascular: No hyperdense vessel or unexpected calcification. Skull: Normal. Negative for fracture or focal lesion. Sinuses/Orbits: No acute finding. Other: None. IMPRESSION: No acute intracranial pathology. Electronically Signed   By: Virgina Norfolk M.D.   On: 12/26/2019 01:49    Procedures Procedures (including critical care time)  Medications Ordered in ED Medications  ketorolac (TORADOL)  injection 60 mg (has no administration in time range)  divalproex (DEPAKOTE) DR tablet 500 mg (has no administration in time range)  dexamethasone (DECADRON) injection 4 mg (has no administration in time range)  divalproex (DEPAKOTE ER) 250 MG 24 hr tablet (has no administration in time range)    ED Course  I have reviewed the triage vital signs and the nursing notes.  Pertinent labs & imaging results that were available during my care of the patient were reviewed by me and considered in my medical decision making (see chart for details).    Visual Acuity  Right Eye Distance: 20/70 Left Eye Distance: 20/50 Bilateral Distance: (S) 20/40 (corrected with contacts- pt states prescription is old)  Right Eye Near:   Left Eye Near:    Bilateral Near:     Head CT within 6 hours.  Well appearing  Final Clinical Impression(s) / ED Diagnoses Final diagnoses:  None   No indication for LP.  No signs of meningitis, neck is supple, no fevers, no tick bites.  Disks are sharp, EOMI no changes in cognition no proptosis, doubt cavernous sinus thrombosis (no JJ vax), I doubt glaucoma as the ocular pressure is normal but will refer to ophthalmology.  Patient is improved post medication.  Stable for discharge with close follow up.   Azaliah Carrero was evaluated in Emergency Department on 12/26/2019 for the symptoms described in the history of present illness. He was evaluated in the context of the global COVID-19 pandemic, which necessitated consideration that the patient might be at risk for infection with the SARS-CoV-2 virus that causes COVID-19. Institutional protocols and algorithms that pertain to the evaluation of patients at risk for COVID-19 are in a state of rapid change based on information released by regulatory bodies including the CDC and federal and state organizations. These policies and algorithms were followed during the patient's care in the ED.  Rx / DC Orders Return for intractable  cough, coughing up blood,fevers >100.4 unrelieved by medication, shortness of breath, intractable vomiting, chest pain, shortness of breath, weakness,numbness, changes in speech, facial asymmetry,abdominal pain, passing out,Inability to tolerate liquids or food, cough, altered mental status or any  concerns. No signs of systemic illness or infection. The patient is nontoxic-appearing on exam and vital signs are within normal limits.   I have reviewed the triage vital signs and the nursing notes. Pertinent labs &imaging results that were available during my care of the patient were reviewed by me and considered in my medical decision making (see chart for details).After history, exam, and medical workup I feel the patient has beenappropriately medically screened and is safe for discharge home. Pertinent diagnoses were discussed with the patient. Patient was given return precautions.   Melesa Lecy, MD 12/26/19 [id number]

## 2019-12-26 NOTE — ED Notes (Signed)
Patient transported to CT 

## 2020-01-12 ENCOUNTER — Ambulatory Visit: Payer: Self-pay | Admitting: Family Medicine

## 2020-01-16 ENCOUNTER — Other Ambulatory Visit: Payer: Self-pay | Admitting: Family Medicine

## 2020-01-16 DIAGNOSIS — Z794 Long term (current) use of insulin: Secondary | ICD-10-CM

## 2020-01-16 MED ORDER — TRUE METRIX BLOOD GLUCOSE TEST VI STRP: ORAL_STRIP | 11 refills | Status: DC

## 2020-01-16 MED ORDER — INSULIN GLARGINE 100 UNIT/ML ~~LOC~~ SOLN: 40.0000 [IU] | Freq: Every day | SUBCUTANEOUS | 1 refills | Status: DC

## 2020-01-16 MED ORDER — GLIPIZIDE ER 10 MG PO TB24: 10.0000 mg | ORAL_TABLET | Freq: Every day | ORAL | 0 refills | Status: DC

## 2020-01-16 MED ORDER — VICTOZA 18 MG/3ML ~~LOC~~ SOPN: PEN_INJECTOR | SUBCUTANEOUS | 1 refills | Status: DC

## 2020-01-16 MED FILL — TESTOSTERONE CYP 200 MG/ML: 200 | 28 days supply | Qty: 4 | Fill #0

## 2020-01-16 NOTE — Telephone Encounter (Signed)
Requested Prescriptions  Pending Prescriptions Disp Refills  . glipiZIDE (GLUCOTROL XL) 10 MG 24 hr tablet 90 tablet 0    Sig: Take 1 tablet (10 mg total) by mouth daily with breakfast.     Endocrinology:  Diabetes - Sulfonylureas Failed - 01/16/2020  1:12 PM      Failed - HBA1C is between 0 and 7.9 and within 180 days    HbA1c, POC (controlled diabetic range)  Date Value Ref Range Status  10/12/2019 8.7 (A) 0.0 - 7.0 % Final         Passed - Valid encounter within last 6 months    Recent Outpatient Visits          3 months ago Type 2 diabetes mellitus with hyperglycemia, with long-term current use of insulin (HCC)   [facility] [place], [place], [place]   6 months ago Type 2 diabetes mellitus with hyperglycemia, with long-term current use of insulin (HCC)   [facility] [place], [place], [place]   7 months ago Sinusitis, unspecified chronicity, unspecified location   [facility] [place], [place], [place]   9 months ago Type 2 diabetes mellitus with hyperglycemia, with long-term current use of insulin (HCC)   [facility] Hoy Register, MD   1 year ago Other insomnia   [facility] Hoy Register, MD      Future Appointments            In 1 month Hoy Register, MD [facility]           . glucose blood (TRUE METRIX BLOOD GLUCOSE TEST) test strip 100 each 11    Sig: Use as instructed     Endocrinology: Diabetes - Testing Supplies Passed - 01/16/2020  1:12 PM      Passed - Valid encounter within last 12 months    Recent Outpatient Visits          3 months ago Type 2 diabetes mellitus with hyperglycemia, with long-term current use of insulin (HCC)   [facility] [place], [place], [place]   6 months ago Type 2 diabetes mellitus with hyperglycemia, with long-term current use of insulin (HCC)    [facility] [place], [place], [place]   7 months ago Sinusitis, unspecified chronicity, unspecified location   [facility] [place], [place], [place]   9 months ago Type 2 diabetes mellitus with hyperglycemia, with long-term current use of insulin (HCC)   [facility] Hoy Register, MD   1 year ago Other insomnia   [facility] Hoy Register, MD      Future Appointments            In 1 month Hoy Register, MD [facility]           . insulin glargine (LANTUS) 100 UNIT/ML injection 30 mL 1    Sig: Inject 0.4 mLs (40 Units total) into the skin at bedtime.     Endocrinology:  Diabetes - Insulins Failed - 01/16/2020  1:12 PM      Failed - HBA1C is between 0 and 7.9 and within 180 days    HbA1c, POC (controlled diabetic range)  Date Value Ref Range Status  10/12/2019 8.7 (A) 0.0 - 7.0 % Final  Passed - Valid encounter within last 6 months    Recent Outpatient Visits          3 months ago Type 2 diabetes mellitus with hyperglycemia, with long-term current use of insulin (HCC)   [facility] [place], [place], [place]   6 months ago Type 2 diabetes mellitus with hyperglycemia, with long-term current use of insulin (HCC)   [facility] [place], [place], [place]   7 months ago Sinusitis, unspecified chronicity, unspecified location   [facility] [place], [place], [place]   9 months ago Type 2 diabetes mellitus with hyperglycemia, with long-term current use of insulin (HCC)   [facility] Hoy Register, MD   1 year ago Other insomnia   [facility] Hoy Register, MD      Future Appointments            In 1 month Hoy Register, MD [facility]           .  liraglutide (VICTOZA) 18 MG/3ML SOPN 9 mL 1    Sig: Start 0.6mg  SQ once a day for 7 days, then increase to 1.2mg  once a day then 1.8mg  thereafter     Endocrinology:  Diabetes - GLP-1 Receptor Agonists Failed - 01/16/2020  1:12 PM      Failed - HBA1C is between 0 and 7.9 and within 180 days    HbA1c, POC (controlled diabetic range)  Date Value Ref Range Status  10/12/2019 8.7 (A) 0.0 - 7.0 % Final         Passed - Valid encounter within last 6 months    Recent Outpatient Visits          3 months ago Type 2 diabetes mellitus with hyperglycemia, with long-term current use of insulin (HCC)   [facility] [place], [place], [place]   6 months ago Type 2 diabetes mellitus with hyperglycemia, with long-term current use of insulin (HCC)   [facility] [place], [place], [place]   7 months ago Sinusitis, unspecified chronicity, unspecified location   [facility] [place], [place], [place]   9 months ago Type 2 diabetes mellitus with hyperglycemia, with long-term current use of insulin (HCC)   [facility] Hoy Register, MD   1 year ago Other insomnia   [facility] Hoy Register, MD      Future Appointments            In 1 month Hoy Register, MD [facility]            Phone call to pt.  Scheduled for 3 mo. F/u on 03/06/20 @ 3:30 PM; refills given per protocol.

## 2020-01-16 NOTE — Telephone Encounter (Signed)
Medication: glipiZIDE (GLUCOTROL XL) 10 MG 24 hr tablet [[id number]] , insulin glargine (LANTUS) 100 UNIT/ML injection [[id number]]  ENDED, liraglutide (VICTOZA) 18 MG/3ML SOPN [[id number]]  DISCONTINUED, glucose blood (TRUE METRIX BLOOD GLUCOSE TEST) test strip [[id number]]  DISCONTINUED  Has the patient contacted their pharmacy? Yes  (Agent: If no, request that the patient contact the pharmacy for the refill.) (Agent: If yes, when and what did the pharmacy advise?)  Preferred Pharmacy (with phone number or street name): [facility] - [place], [place] - [address]  Phone:  [phone] Fax:  [phone]     Agent: Please be advised that RX refills may take up to 3 business days. We ask that you follow-up with your pharmacy.

## 2020-01-17 MED FILL — glipiZIDE XL 10 MG TB24: 10 | 30 days supply | Qty: 30 | Fill #0

## 2020-01-17 MED FILL — TRUE METRIX TEST STRIP: 30 days supply | Qty: 100 | Fill #0

## 2020-01-17 MED FILL — ?HUMALOG 100 UNITS/ML KWIKP: 100 | 25 days supply | Qty: 9 | Fill #1

## 2020-01-17 MED FILL — !VICTOZA 18MG/3ML INJECT: 18 | 17 days supply | Qty: 3 | Fill #0

## 2020-01-17 MED FILL — $LANTUS 100 UNITS/ML VIAL: 100 | 25 days supply | Qty: 10 | Fill #4

## 2020-01-17 NOTE — Telephone Encounter (Signed)
Pt states that he needs test strips, please advise

## 2020-02-21 MED FILL — TRUEPLUS PEN NDL 32GX5/32: 32G X 4 MM | 25 days supply | Qty: 100 | Fill #1

## 2020-02-21 MED FILL — $LANTUS 100 UNITS/ML VIAL: 100 | 25 days supply | Qty: 10 | Fill #5

## 2020-02-21 MED FILL — VICTOZA 18 MG/3 ML INJECT P: 18 | 30 days supply | Qty: 9 | Fill #1

## 2020-03-06 ENCOUNTER — Other Ambulatory Visit: Payer: Self-pay

## 2020-03-06 ENCOUNTER — Ambulatory Visit: Payer: Self-pay | Attending: Family Medicine | Admitting: Family Medicine

## 2020-03-06 ENCOUNTER — Encounter: Payer: Self-pay | Admitting: Family Medicine

## 2020-03-06 ENCOUNTER — Other Ambulatory Visit: Payer: Self-pay | Admitting: Pharmacist

## 2020-03-06 VITALS — BP 136/83 | HR 98 | Ht 65.0 in | Wt 312.0 lb

## 2020-03-06 DIAGNOSIS — Z566 Other physical and mental strain related to work: Secondary | ICD-10-CM

## 2020-03-06 DIAGNOSIS — Z1159 Encounter for screening for other viral diseases: Secondary | ICD-10-CM

## 2020-03-06 DIAGNOSIS — B372 Candidiasis of skin and nail: Secondary | ICD-10-CM

## 2020-03-06 DIAGNOSIS — E1165 Type 2 diabetes mellitus with hyperglycemia: Secondary | ICD-10-CM

## 2020-03-06 DIAGNOSIS — Z794 Long term (current) use of insulin: Secondary | ICD-10-CM

## 2020-03-06 DIAGNOSIS — Z720 Tobacco use: Secondary | ICD-10-CM

## 2020-03-06 DIAGNOSIS — R61 Generalized hyperhidrosis: Secondary | ICD-10-CM

## 2020-03-06 DIAGNOSIS — Z23 Encounter for immunization: Secondary | ICD-10-CM

## 2020-03-06 LAB — GLUCOSE, POCT (MANUAL RESULT ENTRY): POC Glucose: 269 mg/dl — AB (ref 70–99)

## 2020-03-06 LAB — POCT GLYCOSYLATED HEMOGLOBIN (HGB A1C): HbA1c, POC (controlled diabetic range): 11.8 % — AB (ref 0.0–7.0)

## 2020-03-06 MED ORDER — GLIPIZIDE ER 10 MG PO TB24: 20.0000 mg | ORAL_TABLET | Freq: Every day | ORAL | 1 refills | Status: DC

## 2020-03-06 MED ORDER — DRYSOL 20 % EX SOLN: Freq: Every day | CUTANEOUS | 1 refills | Status: AC

## 2020-03-06 MED ORDER — TRULICITY 1.5 MG/0.5ML ~~LOC~~ SOAJ: 1.5000 mg | SUBCUTANEOUS | 2 refills | Status: DC

## 2020-03-06 MED ORDER — NYSTATIN 100000 UNIT/GM EX POWD: 1.0000 "application " | Freq: Three times a day (TID) | CUTANEOUS | 1 refills | Status: AC

## 2020-03-06 MED ORDER — VICTOZA 18 MG/3ML ~~LOC~~ SOPN: 1.8000 mg | PEN_INJECTOR | Freq: Every day | SUBCUTANEOUS | 3 refills | Status: DC

## 2020-03-06 MED ORDER — INSULIN GLARGINE 100 UNIT/ML ~~LOC~~ SOLN: 40.0000 [IU] | Freq: Every day | SUBCUTANEOUS | 6 refills | Status: DC

## 2020-03-06 MED FILL — NYSTATIN 100000 UNIT/GM POW: 100000 | 20 days supply | Qty: 60 | Fill #0

## 2020-03-06 MED FILL — glipiZIDE XL 10 MG TB24: 10 | 30 days supply | Qty: 60 | Fill #0

## 2020-03-06 MED FILL — TRULICITY 1.5 MG/0.5 ML PEN: 1.5 | 28 days supply | Qty: 2 | Fill #0

## 2020-03-06 NOTE — Progress Notes (Signed)
States that she is super sweaty and she has a rash on  inner thigh

## 2020-03-06 NOTE — Patient Instructions (Signed)

## 2020-03-06 NOTE — Progress Notes (Signed)
Subjective:  Patient ID: Diana Joyce, adult    DOB: 25-Mar-1991  Age: 29 y.o. MRN: [id number]  CC: Diabetes   HPI Gerianne Simonet is a 29 year old [facility]) with type 2 diabetes mellitus (A1c 11.8), asthma, morbid obesity here for follow-up visit She started testosterone 07/25/19.  She has been stressed at work, working over time and she has started smoking again. She is unable to cook and is doing a lot of fast foods Blood sugars are 180-190 per patient but her A1c is 11.8 which has trended up from 9.7 previously. Sometimes sleeps off because she is tired and forgets to take her medications.  She does not exercise.  She has been sweating in between her thighs and subsequently developing a rash and is unsure if this is related to testosterone injections.  She has supply some form of powder in between her thighs to reduce symptoms.  Rash is absent at the moment. Past Medical History:  Diagnosis Date  . Apnea, sleep   . Asthma   . Diabetes [facility])     Past Surgical History:  Procedure Laterality Date  . FRACTURE SURGERY      Family History  Problem Relation Age of Onset  . Kidney failure Mother   . Hypertension Mother   . Diabetes Mother   . Heart failure Mother   . Diabetes Father     Allergies  Allergen Reactions  . Benadryl [Diphenhydramine Hcl] Other (See Comments)    Hives, coughing, itching.     Outpatient Medications Prior to Visit  Medication Sig Dispense Refill  . albuterol (VENTOLIN HFA) 108 (90 Base) MCG/ACT inhaler Inhale 2 puffs into the lungs every 6 (six) hours as needed for wheezing or shortness of breath. 18 g 2  . blood glucose meter kit and supplies KIT Dispense with test strips and lancets based on insurance preference. Use up to four times daily as directed. (FOR ICD-9 250.00, 250.01). 100 each 12  . Blood Glucose Monitoring Suppl (TRUE METRIX METER) w/Device KIT  Check blood sugars fasting and at bedtime daily 1 kit 1  . glucose blood (TRUE METRIX BLOOD GLUCOSE TEST) test strip Use as instructed 100 each 11  . insulin aspart (NOVOLOG FLEXPEN) 100 UNIT/ML FlexPen 0 to 12 units 3 times daily before meals as per sliding scale 15 mL 6  . Insulin Pen Needle (PEN NEEDLES) 32G X 4 MM MISC 1 Dose by Does not apply route daily. 100 each 1  . pantoprazole (PROTONIX) 20 MG tablet Take 1 tablet (20 mg total) by mouth daily. 30 tablet 0  . TRUEPLUS LANCETS 28G MISC Check blood sugars fasting and at bedtime 100 each 1  . aluminum chloride (DRYSOL) 20 % external solution Apply topically at bedtime. 35 mL 1  . glipiZIDE (GLUCOTROL XL) 10 MG 24 hr tablet Take 1 tablet (10 mg total) by mouth daily with breakfast. 90 tablet 0  . insulin glargine (LANTUS) 100 UNIT/ML injection Inject 0.4 mLs (40 Units total) into the skin at bedtime. 30 mL 1  . liraglutide (VICTOZA) 18 MG/3ML SOPN Start 0.33m SQ once a day for 7 days, then increase to 1.279monce a day then 1.85m4mhereafter 9 mL 1  . amoxicillin (AMOXIL) 500 MG capsule Take 1 capsule (500 mg total) by mouth 3 (three) times daily. (Patient not taking: Reported on 07/13/2019) 30 capsule 0  . Norgestimate-Ethinyl Estradiol Triphasic (ORTHO TRI-CYCLEN LO) 0.18/0.215/0.25 MG-25 MCG tab  Take 1 tablet by mouth daily. (Patient not taking: Reported on 10/12/2018) 1 Package 11   No facility-administered medications prior to visit.     ROS Review of Systems  Constitutional: Negative for activity change, appetite change and fatigue.  HENT: Negative for congestion, sinus pressure and sore throat.   Eyes: Negative for visual disturbance.  Respiratory: Negative for cough, chest tightness, shortness of breath and wheezing.   Cardiovascular: Negative for chest pain and palpitations.  Gastrointestinal: Negative for abdominal distention, abdominal pain and constipation.  Endocrine: Negative for polydipsia.  Genitourinary: Negative for dysuria  and frequency.  Musculoskeletal: Negative for arthralgias and back pain.  Skin: Negative for rash.  Neurological: Negative for tremors, light-headedness and numbness.  Hematological: Does not bruise/bleed easily.  Psychiatric/Behavioral: Negative for agitation and behavioral problems.    Objective:  BP 136/83   Pulse 98   Ht _0  (1.651 m)   Wt (!) 312 lb (141.5 kg)   SpO2 99%   BMI 51.92 kg/m   BP/Weight 03/06/2020 12/26/2019 2/53/6644  Systolic BP 034 742 -  Diastolic BP 83 62 -  Wt. (Lbs) 312 - 309.9  BMI 51.92 - 51.57      Physical Exam Constitutional:      Appearance: He is well-developed. He is obese.  Neck:     Vascular: No JVD.  Cardiovascular:     Rate and Rhythm: Normal rate.     Heart sounds: Normal heart sounds. No murmur heard.   Pulmonary:     Effort: Pulmonary effort is normal.     Breath sounds: Normal breath sounds. No wheezing or rales.  Chest:     Chest wall: No tenderness.  Abdominal:     General: Bowel sounds are normal. There is no distension.     Palpations: Abdomen is soft. There is no mass.     Tenderness: There is no abdominal tenderness.  Musculoskeletal:        General: Normal range of motion.     Right lower leg: No edema.     Left lower leg: No edema.  Skin:    Findings: No rash.  Neurological:     Mental Status: He is alert and oriented to person, place, and time.  Psychiatric:        Mood and Affect: Mood normal.     CMP Latest Ref Rng & Units 10/12/2019 07/21/2019 07/13/2019  Glucose 65 - 99 mg/dL 82 94 115(H)  BUN 6 - 20 mg/dL _1 Creatinine 0.57 - 1.00 mg/dL 0.62 0.60 0.74  Sodium 134 - 144 mmol/L 138 137 142  Potassium 3.5 - 5.2 mmol/L 4.0 3.7 4.1  Chloride 96 - 106 mmol/L 97 103 104  CO2 20 - 29 mmol/L _2 Calcium 8.7 - 10.2 mg/dL 9.2 8.9 8.9  Total Protein 6.0 - 8.5 g/dL 7.3 - 7.2  Total Bilirubin 0.0 - 1.2 mg/dL 0.3 - 0.3  Alkaline Phos 39 - 117 IU/L 101 - 115  AST 0 - 40 IU/L 19 - 39  ALT 0 - 32 IU/L 17 -  41(H)    Lipid Panel     Component Value Date/Time   CHOL 150 10/12/2019 1051   TRIG 155 (H) 10/12/2019 1051   HDL 28 (L) 10/12/2019 1051   CHOLHDL 5.4 (H) 10/12/2019 1051   LDLCALC 94 10/12/2019 1051    CBC    Component Value Date/Time   WBC 10.6 (H) 07/21/2019 0507   RBC 4.79 07/21/2019 0507  HGB 11.3 (L) 07/21/2019 0507   HCT 36.4 07/21/2019 0507   PLT 415 (H) 07/21/2019 0507   MCV 76.0 (L) 07/21/2019 0507   MCH 23.6 (L) 07/21/2019 0507   MCHC 31.0 07/21/2019 0507   RDW 15.3 07/21/2019 0507   LYMPHSABS 2.7 06/07/2019 1920   MONOABS 0.6 06/07/2019 1920   EOSABS 0.1 06/07/2019 1920   BASOSABS 0.0 06/07/2019 1920    Lab Results  Component Value Date   HGBA1C 11.8 (A) 03/06/2020    Assessment & Plan:  1. Type 2 diabetes mellitus with hyperglycemia, with long-term current use of insulin (HCC) Uncontrolled with A1c of 11.8; goal is less than 7.0 Noncompliance with medications and diet largely contributory Testosterone could also be playing a role Increased dose of glipizide She has been on Victoza but unfortunately has not followed through with her application paperwork for patient assistance program Switched to Trulicity which is available in house - POCT glucose (manual entry) - POCT glycosylated hemoglobin (Hb A1C) - glipiZIDE (GLUCOTROL XL) 10 MG 24 hr tablet; Take 2 tablets (20 mg total) by mouth daily with breakfast.  Dispense: 180 tablet; Refill: 1 - Basic Metabolic Panel - insulin glargine (LANTUS) 100 UNIT/ML injection; Inject 0.4 mLs (40 Units total) into the skin at bedtime.  Dispense: 30 mL; Refill: 6  2. Excessive sweating Refill Drysol [facility] would need to discuss with [facility] as this could be possible side effect of testosterone - aluminum chloride (DRYSOL) 20 % external solution; Apply topically at bedtime.  Dispense: 35 mL; Refill: 1  3. Morbid obesity ([place]) Counseled on reducing portion sizes, increasing physical activity  4. Need for  hepatitis C screening test - HCV RNA quant rflx ultra or genotyp([facility])  5. Candidiasis of skin Placed on nystatin  6. Stress at work Refer to [employer] for counseling with regards to coping strategies for stress  7. Tobacco abuse Spent 3 minutes counseling on smoking cessation and she is not ready to quit at this time  8. Need for immunization against influenza - Flu Vaccine QUAD 36+ mos IM    Meds ordered this encounter  Medications  . aluminum chloride (DRYSOL) 20 % external solution    Sig: Apply topically at bedtime.    Dispense:  35 mL    Refill:  1  . glipiZIDE (GLUCOTROL XL) 10 MG 24 hr tablet    Sig: Take 2 tablets (20 mg total) by mouth daily with breakfast.    Dispense:  180 tablet    Refill:  1    Dose increase  . nystatin (MYCOSTATIN/NYSTOP) powder    Sig: Apply 1 application topically 3 (three) times daily.    Dispense:  60 g    Refill:  1  . insulin glargine (LANTUS) 100 UNIT/ML injection    Sig: Inject 0.4 mLs (40 Units total) into the skin at bedtime.    Dispense:  30 mL    Refill:  6  . DISCONTD: liraglutide (VICTOZA) 18 MG/3ML SOPN    Sig: Inject 1.8 mg into the skin daily.    Dispense:  9 mL    Refill:  3    Follow-up: Return for [facility] for stress; 3 months PCP.       Charlott Rakes, MD, FAAFP. [facility] [place], [place]   03/06/2020, 5:09 PM

## 2020-03-07 LAB — BASIC METABOLIC PANEL
BUN/Creatinine Ratio: 10 (ref 9–23)
BUN: 6 mg/dL (ref 6–20)
CO2: 25 mmol/L (ref 20–29)
Calcium: 8.9 mg/dL (ref 8.7–10.2)
Chloride: 101 mmol/L (ref 96–106)
Creatinine, Ser: 0.62 mg/dL (ref 0.57–1.00)
GFR calc Af Amer: 143 mL/min/{1.73_m2} (ref >59)
GFR calc non Af Amer: 124 mL/min/{1.73_m2} (ref >59)
Glucose: 253 mg/dL — ABNORMAL HIGH (ref 65–99)
Potassium: 4.4 mmol/L (ref 3.5–5.2)
Sodium: 136 mmol/L (ref 134–144)

## 2020-03-07 LAB — HCV RNA QUANT RFLX ULTRA OR GENOTYP: HCV Quant Baseline: NOT DETECTED IU/mL

## 2020-03-12 ENCOUNTER — Institutional Professional Consult (permissible substitution): Payer: Self-pay | Admitting: Licensed Clinical Social Worker

## 2020-03-30 ENCOUNTER — Other Ambulatory Visit (HOSPITAL_COMMUNITY): Payer: Self-pay | Admitting: Certified Nurse Midwife

## 2020-03-30 MED FILL — HUMALOG 100 UNITS/ML KWIKPE: 100 | 25 days supply | Qty: 9 | Fill #2

## 2020-03-30 MED FILL — glipiZIDE XL 10 MG TB24: 10 | 30 days supply | Qty: 60 | Fill #0

## 2020-03-30 MED FILL — $LANTUS 100 UNITS/ML VIAL: 100 | 25 days supply | Qty: 10 | Fill #6

## 2020-03-30 MED FILL — TRULICITY 1.5 MG/0.5 ML PEN: 1.5 | 28 days supply | Qty: 2 | Fill #0

## 2020-03-30 MED FILL — TESTOSTERONE CYP 200 MG/ML: 200 | 42 days supply | Qty: 6 | Fill #0

## 2020-04-25 MED FILL — TRULICITY 1.5 MG/0.5 ML PEN: 1.5 | 28 days supply | Qty: 2 | Fill #1

## 2020-04-25 MED FILL — $LANTUS 100 UNITS/ML VIAL: 100 | 25 days supply | Qty: 10 | Fill #7

## 2020-05-15 MED FILL — $LANTUS 100 UNITS/ML VIAL: 100 | 25 days supply | Qty: 10 | Fill #8

## 2020-05-15 MED FILL — glipiZIDE XL 10 MG TB24: 10 | 30 days supply | Qty: 60 | Fill #1

## 2020-05-28 MED FILL — TRULICITY 1.5 MG/0.5 ML PEN: 1.5 | 28 days supply | Qty: 2 | Fill #2

## 2020-06-12 ENCOUNTER — Ambulatory Visit: Payer: Self-pay | Admitting: Family Medicine

## 2020-06-13 ENCOUNTER — Encounter (HOSPITAL_BASED_OUTPATIENT_CLINIC_OR_DEPARTMENT_OTHER): Payer: Self-pay | Admitting: *Deleted

## 2020-06-13 ENCOUNTER — Other Ambulatory Visit: Payer: Self-pay

## 2020-06-13 DIAGNOSIS — Z794 Long term (current) use of insulin: Secondary | ICD-10-CM | POA: Diagnosis not present

## 2020-06-13 DIAGNOSIS — J45909 Unspecified asthma, uncomplicated: Secondary | ICD-10-CM | POA: Diagnosis not present

## 2020-06-13 DIAGNOSIS — E119 Type 2 diabetes mellitus without complications: Secondary | ICD-10-CM | POA: Diagnosis not present

## 2020-06-13 DIAGNOSIS — Z7984 Long term (current) use of oral hypoglycemic drugs: Secondary | ICD-10-CM | POA: Insufficient documentation

## 2020-06-13 DIAGNOSIS — R0981 Nasal congestion: Secondary | ICD-10-CM | POA: Diagnosis present

## 2020-06-13 DIAGNOSIS — F17290 Nicotine dependence, other tobacco product, uncomplicated: Secondary | ICD-10-CM | POA: Insufficient documentation

## 2020-06-13 DIAGNOSIS — U071 COVID-19: Secondary | ICD-10-CM | POA: Insufficient documentation

## 2020-06-13 NOTE — ED Triage Notes (Signed)
covid sx x 2 days

## 2020-06-14 ENCOUNTER — Emergency Department (HOSPITAL_BASED_OUTPATIENT_CLINIC_OR_DEPARTMENT_OTHER)
Admission: EM | Admit: 2020-06-14 | Discharge: 2020-06-14 | Disposition: A | Discharge status: Home / Self Care | Payer: HRSA Program | Attending: Emergency Medicine | Admitting: Emergency Medicine

## 2020-06-14 ENCOUNTER — Emergency Department (HOSPITAL_BASED_OUTPATIENT_CLINIC_OR_DEPARTMENT_OTHER): Admit: 2020-06-14 | Payer: HRSA Program

## 2020-06-14 ENCOUNTER — Emergency Department (HOSPITAL_BASED_OUTPATIENT_CLINIC_OR_DEPARTMENT_OTHER): Payer: HRSA Program

## 2020-06-14 DIAGNOSIS — J069 Acute upper respiratory infection, unspecified: Secondary | ICD-10-CM

## 2020-06-14 DIAGNOSIS — Z20822 Contact with and (suspected) exposure to covid-19: Secondary | ICD-10-CM

## 2020-06-14 LAB — SARS CORONAVIRUS 2 (TAT 6-24 HRS): SARS Coronavirus 2: POSITIVE — AB

## 2020-06-14 LAB — CBG MONITORING, ED: Glucose-Capillary: 236 mg/dL — ABNORMAL HIGH (ref 70–99)

## 2020-06-14 MED ORDER — ALBUTEROL SULFATE HFA 108 (90 BASE) MCG/ACT IN AERS
2.0000 | INHALATION_SPRAY | Freq: Once | RESPIRATORY_TRACT | Status: AC
  Administered 2020-06-14: 2 via RESPIRATORY_TRACT
  Filled 2020-06-14: qty 6.7

## 2020-06-14 MED ORDER — KETOROLAC TROMETHAMINE 60 MG/2ML IM SOLN
30.0000 mg | Freq: Once | INTRAMUSCULAR | Status: AC
  Administered 2020-06-14: 30 mg via INTRAMUSCULAR
  Filled 2020-06-14: qty 2

## 2020-06-14 NOTE — ED Notes (Signed)
Patient verbalizes understanding of discharge instructions. Opportunity for questioning and answers were provided. Armband removed by staff, pt discharged from ED ambulatory to home.  

## 2020-06-14 NOTE — ED Provider Notes (Signed)
[place] EMERGENCY DEPARTMENT Provider Note   CSN: [id number] Arrival date & time: 06/13/20  2208     History Chief Complaint  Patient presents with  . covid sx    Diana Joyce is a 30 y.o. adult.  Recently traveled to [place] with her girlfriend and now they both have the same symptoms of congestion, cough, runny nose, body aches and subjective fevers.  Third person that went with him is asymptomatic at this time.  No known sick contacts.  Has not checked her fever.  No recent illnesses otherwise.  No urinary symptoms.  Does have some back pain.  Has not tried anything for the symptoms. Does state that she had a swollen left leg while driving down to [place].         Past Medical History:  Diagnosis Date  . Apnea, sleep   . Asthma   . Diabetes [facility])     Patient Active Problem List   Diagnosis Date Noted  . Tobacco abuse 03/06/2020  . Sleep apnea 12/07/2017  . Class 3 severe obesity due to excess calories without serious comorbidity with body mass index (BMI) of 50.0 to 59.9 in adult ([place]) 06/19/2017  . Diabetes [facility])     Past Surgical History:  Procedure Laterality Date  . FRACTURE SURGERY       OB History   No obstetric history on file.     Family History  Problem Relation Age of Onset  . Kidney failure Mother   . Hypertension Mother   . Diabetes Mother   . Heart failure Mother   . Diabetes Father     Social History   Tobacco Use  . Smoking status: Current Every Day Smoker    Types: Cigars  . Smokeless tobacco: Never Used  Substance Use Topics  . Alcohol use: Yes  . Drug use: No    Home Medications Prior to Admission medications   Medication Sig Start Date End Date Taking? Authorizing Provider  albuterol (VENTOLIN HFA) 108 (90 Base) MCG/ACT inhaler Inhale 2 puffs into the lungs every 6 (six) hours as needed for wheezing or shortness of breath. 10/12/19   Charlott Rakes, MD  aluminum chloride (DRYSOL) 20 % external solution Apply  topically at bedtime. 03/06/20   Charlott Rakes, MD  amoxicillin (AMOXIL) 500 MG capsule Take 1 capsule (500 mg total) by mouth 3 (three) times daily. Patient not taking: Reported on 07/13/2019 06/16/19   Argentina Donovan, PA-C  blood glucose meter kit and supplies KIT Dispense with test strips and lancets based on insurance preference. Use up to four times daily as directed. (FOR ICD-9 250.00, 250.01). 12/07/17   Charlott Rakes, MD  Blood Glucose Monitoring Suppl (TRUE METRIX METER) w/Device KIT Check blood sugars fasting and at bedtime daily 05/07/17   Argentina Donovan, PA-C  Dulaglutide (TRULICITY) 1.5 QD/8.2ME SOPN Inject 1.5 mg into the skin once a week. 03/06/20   Charlott Rakes, MD  glipiZIDE (GLUCOTROL XL) 10 MG 24 hr tablet Take 2 tablets (20 mg total) by mouth daily with breakfast. 03/06/20   Charlott Rakes, MD  glucose blood (TRUE METRIX BLOOD GLUCOSE TEST) test strip Use as instructed 01/16/20   Charlott Rakes, MD  insulin aspart (NOVOLOG FLEXPEN) 100 UNIT/ML FlexPen 0 to 12 units 3 times daily before meals as per sliding scale 10/12/19   Charlott Rakes, MD  insulin glargine (LANTUS) 100 UNIT/ML injection Inject 0.4 mLs (40 Units total) into the skin at bedtime. 03/06/20 07/17/20  Charlott Rakes,  MD  Insulin Pen Needle (PEN NEEDLES) 32G X 4 MM MISC 1 Dose by Does not apply route daily. 04/13/19   Charlott Rakes, MD  Norgestimate-Ethinyl Estradiol Triphasic (ORTHO TRI-CYCLEN LO) 0.18/0.215/0.25 MG-25 MCG tab Take 1 tablet by mouth daily. Patient not taking: Reported on 10/12/2018 07/13/18   Charlott Rakes, MD  nystatin (MYCOSTATIN/NYSTOP) powder Apply 1 application topically 3 (three) times daily. 03/06/20   Charlott Rakes, MD  pantoprazole (PROTONIX) 20 MG tablet Take 1 tablet (20 mg total) by mouth daily. 07/21/19   Nuala Alpha A, PA-C  TRUEPLUS LANCETS 28G MISC Check blood sugars fasting and at bedtime 05/07/17   Argentina Donovan, PA-C    Allergies    Benadryl [diphenhydramine  hcl]  Review of Systems   Review of Systems  All other systems reviewed and are negative.   Physical Exam Updated Vital Signs BP 132/83 (BP Location: Right Arm)   Pulse (!) 106   Temp 99.5 F (37.5 C) (Oral)   Resp 16   Ht 5' 5"  (1.651 m)   Wt (!) 141.5 kg   SpO2 98%   BMI 51.92 kg/m   Physical Exam Vitals and nursing note reviewed.  Constitutional:      Appearance: He is well-developed and well-nourished.  HENT:     Head: Normocephalic and atraumatic.     Nose: No congestion or rhinorrhea.     Mouth/Throat:     Mouth: Mucous membranes are moist.  Cardiovascular:     Rate and Rhythm: Normal rate.  Pulmonary:     Effort: Pulmonary effort is normal. No respiratory distress.  Abdominal:     General: Abdomen is flat. There is no distension.  Musculoskeletal:        General: No swelling or tenderness. Normal range of motion.     Cervical back: Normal range of motion.  Skin:    General: Skin is warm and dry.  Neurological:     General: No focal deficit present.     Mental Status: He is alert and oriented to person, place, and time.     ED Results / Procedures / Treatments   Labs (all labs ordered are listed, but only abnormal results are displayed) Labs Reviewed  CBG MONITORING, ED - Abnormal; Notable for the following components:      Result Value   Glucose-Capillary 236 (*)    All other components within normal limits  SARS CORONAVIRUS 2 (TAT 6-24 HRS)    EKG None  Radiology DG Chest Portable 1 View  Result Date: 06/14/2020 CLINICAL DATA:  Ev a, history of diabetes EXAM: PORTABLE CHEST 1 VIEW COMPARISON:  07/21/2019 FINDINGS: The heart size and mediastinal contours are within normal limits. Both lungs are clear. The visualized skeletal structures are unremarkable. IMPRESSION: No active disease. Electronically Signed   By: Randa Ngo M.D.   On: 06/14/2020 02:30    Procedures Procedures (including critical care time)  Medications Ordered in  ED Medications  albuterol (VENTOLIN HFA) 108 (90 Base) MCG/ACT inhaler 2 puff (2 puffs Inhalation Given 06/14/20 0215)  ketorolac (TORADOL) injection 30 mg (30 mg Intramuscular Given 06/14/20 0216)    ED Course  I have reviewed the triage vital signs and the nursing notes.  Pertinent labs & imaging results that were available during my care of the patient were reviewed by me and considered in my medical decision making (see chart for details).    MDM Rules/Calculators/A&P  Upper respiratory infection, likely COVID.  Test pending.  Symptomatic treatment did not really help much.  Her heart rate was a bit high patient having chest pain, shortness of breath or other evidence of pulmonary embolus.  We will go ahead do a DVT study secondary to the left lower extremity swelling by think that is probably just from dependent edema and poor circulation.  Final Clinical Impression(s) / ED Diagnoses Final diagnoses:  None    Rx / DC Orders ED Discharge Orders    None       [place] Hijazi, Corene Cornea, MD 06/14/20 [phone]

## 2020-06-22 ENCOUNTER — Other Ambulatory Visit: Payer: Self-pay | Admitting: Family Medicine

## 2020-06-22 MED FILL — glipiZIDE XL 10 MG TB24: 10 | 30 days supply | Qty: 60 | Fill #2

## 2020-06-22 MED FILL — $LANTUS 100 UNITS/ML VIAL: 100 | 25 days supply | Qty: 10 | Fill #9

## 2020-06-22 MED FILL — TRUE METRIX GLUCOSE TEST ST: 30 days supply | Qty: 100 | Fill #1

## 2020-06-22 MED FILL — !TRULICITY 1.5 MG/0.5 ML PE: 1.5 | 28 days supply | Qty: 2 | Fill #0

## 2020-07-19 MED FILL — $TRULICITY 1.5 MG/0.5 ML PE: 1.5 | 56 days supply | Qty: 4 | Fill #1

## 2020-07-20 MED FILL — TESTOSTERONE CYP 200 MG/ML: 200 | 42 days supply | Qty: 6 | Fill #1

## 2020-07-23 MED FILL — $LANTUS 100 UNITS/ML VIAL: 100 | 75 days supply | Qty: 30 | Fill #0

## 2020-08-01 ENCOUNTER — Other Ambulatory Visit: Payer: Self-pay | Admitting: Family Medicine

## 2020-08-01 ENCOUNTER — Ambulatory Visit: Payer: Self-pay | Attending: Family Medicine | Admitting: Family Medicine

## 2020-08-01 ENCOUNTER — Telehealth: Payer: Self-pay

## 2020-08-01 ENCOUNTER — Other Ambulatory Visit: Payer: Self-pay

## 2020-08-01 ENCOUNTER — Encounter: Payer: Self-pay | Admitting: Family Medicine

## 2020-08-01 VITALS — BP 122/82 | HR 98 | Ht 65.0 in | Wt 320.0 lb

## 2020-08-01 DIAGNOSIS — E1165 Type 2 diabetes mellitus with hyperglycemia: Secondary | ICD-10-CM

## 2020-08-01 DIAGNOSIS — Z794 Long term (current) use of insulin: Secondary | ICD-10-CM

## 2020-08-01 LAB — POCT GLYCOSYLATED HEMOGLOBIN (HGB A1C): HbA1c, POC (controlled diabetic range): 10.1 % — AB (ref 0.0–7.0)

## 2020-08-01 LAB — GLUCOSE, POCT (MANUAL RESULT ENTRY): POC Glucose: 267 mg/dl — AB (ref 70–99)

## 2020-08-01 MED ORDER — TRULICITY 1.5 MG/0.5ML ~~LOC~~ SOAJ: 1.5000 mg | SUBCUTANEOUS | 6 refills | Status: DC

## 2020-08-01 MED ORDER — NOVOLOG FLEXPEN 100 UNIT/ML ~~LOC~~ SOPN
PEN_INJECTOR | SUBCUTANEOUS | 6 refills | Status: DC
Start: 2020-08-01 — End: 2020-08-01

## 2020-08-01 MED ORDER — INSULIN GLARGINE 100 UNIT/ML ~~LOC~~ SOLN: 42.0000 [IU] | Freq: Every day | SUBCUTANEOUS | 6 refills | Status: DC

## 2020-08-01 MED ORDER — GLIPIZIDE ER 10 MG PO TB24: 20.0000 mg | ORAL_TABLET | Freq: Every day | ORAL | 1 refills | Status: DC

## 2020-08-01 MED ORDER — INSULIN LISPRO (1 UNIT DIAL) 100 UNIT/ML (KWIKPEN)
0.0000 [IU] | PEN_INJECTOR | Freq: Three times a day (TID) | SUBCUTANEOUS | 6 refills | Status: DC
Start: 2020-08-01 — End: 2020-08-01

## 2020-08-01 MED FILL — $LANTUS 100 UNITS/ML VIAL: 100 | 23 days supply | Qty: 10 | Fill #0

## 2020-08-01 MED FILL — glipiZIDE XL 10 MG TB24: 10 | 30 days supply | Qty: 60 | Fill #0

## 2020-08-01 MED FILL — HUMALOG 100 UNITS/ML KWIKPE: 100 | 25 days supply | Qty: 9 | Fill #0

## 2020-08-01 NOTE — Progress Notes (Signed)
Subjective:  Patient ID: Diana Joyce, adult    DOB: 09-21-1990  Age: 30 y.o. MRN: [id number]  CC: Diabetes   HPI Diana Joyce is a 71 year [facility]with type 2 diabetes mellitus (A1c 11.8), asthma, morbid obesity here for follow-up visit He started testosterone 07/25/19.  He was taking Glipizide 62m rather than 279mprescribed and in the last month noticed the prescription had read 20 mg and the change was made. Fasting sugars are sometimes 200s  He is aware of a diabetic diet and what he needs to do but endorses not being too compliant with this.  Endorses compliance with other medications. Past Medical History:  Diagnosis Date  . Apnea, sleep   . Asthma   [facility]    Past Surgical History:  Procedure Laterality Date  . FRACTURE SURGERY      Family History  Problem Relation Age of Onset  . Kidney failure Mother   . Hypertension Mother   . Diabetes Mother   . Heart failure Mother   . Diabetes Father     Allergies  Allergen Reactions  . Benadryl [Diphenhydramine Hcl] Other (See Comments)    Hives, coughing, itching.     Outpatient Medications Prior to Visit  Medication Sig Dispense Refill  . albuterol (VENTOLIN HFA) 108 (90 Base) MCG/ACT inhaler Inhale 2 puffs into the lungs every 6 (six) hours as needed for wheezing or shortness of breath. 18 g 2  . blood glucose meter kit and supplies KIT Dispense with test strips and lancets based on insurance preference. Use up to four times daily as directed. (FOR ICD-9 250.00, 250.01). 100 each 12  . Blood Glucose Monitoring Suppl (TRUE METRIX METER) w/Device KIT Check blood sugars fasting and at bedtime daily 1 kit 1  . glipiZIDE (GLUCOTROL XL) 10 MG 24 hr tablet Take 2 tablets (20 mg total) by mouth daily with breakfast. 180 tablet 1  . glucose blood (TRUE METRIX BLOOD GLUCOSE TEST) test strip Use as instructed 100 each 11  .  insulin aspart (NOVOLOG FLEXPEN) 100 UNIT/ML FlexPen 0 to 12 units 3 times daily before meals as per sliding scale 15 mL 6  . Insulin Pen Needle (PEN NEEDLES) 32G X 4 MM MISC 1 Dose by Does not apply route daily. 100 each 1  . nystatin (MYCOSTATIN/NYSTOP) powder Apply 1 application topically 3 (three) times daily. 60 g 1  . TRUEPLUS LANCETS 28G MISC Check blood sugars fasting and at bedtime 10476ach 1  . TRULICITY 1.5 MGLY/6.5KPOPN INJECT 1.5 MG INTO THE SKIN ONCE A WEEK. 2 mL 2  . aluminum chloride (DRYSOL) 20 % external solution Apply topically at bedtime. (Patient not taking: Reported on 08/01/2020) 35 mL 1  . amoxicillin (AMOXIL) 500 MG capsule Take 1 capsule (500 mg total) by mouth 3 (three) times daily. (Patient not taking: No sig reported) 30 capsule 0  . insulin glargine (LANTUS) 100 UNIT/ML injection Inject 0.4 mLs (40 Units total) into the skin at bedtime. 30 mL 6  . Norgestimate-Ethinyl Estradiol Triphasic (ORTHO TRI-CYCLEN LO) 0.18/0.215/0.25 MG-25 MCG tab Take 1 tablet by mouth daily. (Patient not taking: No sig reported) 1 Package 11  . pantoprazole (PROTONIX) 20 MG tablet Take 1 tablet (20 mg total) by mouth daily. (Patient not taking: Reported on 08/01/2020) 30 tablet 0   No facility-administered medications prior to visit.     ROS Review of Systems  Constitutional: Negative for activity change, appetite  change and fatigue.  HENT: Negative for congestion, sinus pressure and sore throat.   Eyes: Negative for visual disturbance.  Respiratory: Negative for cough, chest tightness, shortness of breath and wheezing.   Cardiovascular: Negative for chest pain and palpitations.  Gastrointestinal: Negative for abdominal distention, abdominal pain and constipation.  Endocrine: Negative for polydipsia.  Genitourinary: Negative for dysuria and frequency.  Musculoskeletal: Negative for arthralgias and back pain.  Skin: Negative for rash.  Neurological: Negative for tremors,  light-headedness and numbness.  Hematological: Does not bruise/bleed easily.  Psychiatric/Behavioral: Negative for agitation and behavioral problems.    Objective:  BP 122/82   Pulse 98   Ht 5' 5"  (1.651 m)   Wt (!) 320 lb (145.2 kg)   SpO2 98%   BMI 53.25 kg/m   BP/Weight 08/01/2020 11/11/4648 08/12/4654  Systolic BP 812 751 -  Diastolic BP 82 83 -  Wt. (Lbs) 320 - 312  BMI 53.25 51.92 -      Physical Exam Constitutional:      Appearance: He is well-developed. He is obese.  Neck:     Vascular: No JVD.  Cardiovascular:     Rate and Rhythm: Normal rate.     Heart sounds: Normal heart sounds. No murmur heard.   Pulmonary:     Effort: Pulmonary effort is normal.     Breath sounds: Normal breath sounds. No wheezing or rales.  Chest:     Chest wall: No tenderness.  Abdominal:     General: Bowel sounds are normal. There is no distension.     Palpations: Abdomen is soft. There is no mass.     Tenderness: There is no abdominal tenderness.  Musculoskeletal:        General: Normal range of motion.     Right lower leg: No edema.     Left lower leg: No edema.  Neurological:     Mental Status: He is alert and oriented to person, place, and time.  Psychiatric:        Mood and Affect: Mood normal.     CMP Latest Ref Rng & Units 03/06/2020 10/12/2019 07/21/2019  Glucose 65 - 99 mg/dL 253(H) 82 94  BUN 6 - 20 mg/dL 6 8 6   Creatinine 0.57 - 1.00 mg/dL 0.62 0.62 0.60  Sodium 134 - 144 mmol/L 136 138 137  Potassium 3.5 - 5.2 mmol/L 4.4 4.0 3.7  Chloride 96 - 106 mmol/L 101 97 103  CO2 20 - 29 mmol/L 25 21 25   Calcium 8.7 - 10.2 mg/dL 8.9 9.2 8.9  Total Protein 6.0 - 8.5 g/dL - 7.3 -  Total Bilirubin 0.0 - 1.2 mg/dL - 0.3 -  Alkaline Phos 39 - 117 IU/L - 101 -  AST 0 - 40 IU/L - 19 -  ALT 0 - 32 IU/L - 17 -    Lipid Panel     Component Value Date/Time   CHOL 150 10/12/2019 1051   TRIG 155 (H) 10/12/2019 1051   HDL 28 (L) 10/12/2019 1051   CHOLHDL 5.4 (H) 10/12/2019 1051    LDLCALC 94 10/12/2019 1051    CBC    Component Value Date/Time   WBC 10.6 (H) 07/21/2019 0507   RBC 4.79 07/21/2019 0507   HGB 11.3 (L) 07/21/2019 0507   HCT 36.4 07/21/2019 0507   PLT 415 (H) 07/21/2019 0507   MCV 76.0 (L) 07/21/2019 0507   MCH 23.6 (L) 07/21/2019 0507   MCHC 31.0 07/21/2019 0507   RDW 15.3 07/21/2019 0507  LYMPHSABS 2.7 06/07/2019 1920   MONOABS 0.6 06/07/2019 1920   EOSABS 0.1 06/07/2019 1920   BASOSABS 0.0 06/07/2019 1920    Lab Results  Component Value Date   HGBA1C 10.1 (A) 08/01/2020    Assessment & Plan:  1. Type 2 diabetes mellitus with hyperglycemia, with long-term current use of insulin (HCC) Controlled with A1c of 10.1 with minimal improvement from 11.8 previously Increase Lantus to 42 units at bedtime and she has been advised to continue with 20 mg of glipizide.  Advised to increase Lantus by 2 units every fourth day until blood sugars are at goal She will see the clinical pharmacist at her next visit to review her blood sugar log can uptitrate Lantus as indicated. Counseled on Diabetic diet, my plate method, 659 minutes of moderate intensity exercise/week Blood sugar logs with fasting goals of 80-120 mg/dl, random of less than 180 and in the event of sugars less than 60 mg/dl or greater than 400 mg/dl encouraged to notify the clinic. Advised on the need for annual eye exams, annual foot exams, Pneumonia vaccine. - POCT glucose (manual entry) - POCT glycosylated hemoglobin (Hb A1C) - Microalbumin / creatinine urine ratio - CMP14+EGFR - insulin glargine (LANTUS) 100 UNIT/ML injection; Inject 0.42 mLs (42 Units total) into the skin at bedtime.  Dispense: 30 mL; Refill: 6 - Lipid panel - glipiZIDE (GLUCOTROL XL) 10 MG 24 hr tablet; Take 2 tablets (20 mg total) by mouth daily with breakfast.  Dispense: 180 tablet; Refill: 1 - Dulaglutide (TRULICITY) 1.5 FB/8.7XC SOPN; Inject 1.5 mg into the skin once a week.  Dispense: 2 mL; Refill: 6  2.  Morbid obesity ([place]) Advised to work on increasing physical activity, cutting a portion sizes, avoiding late meals   No orders of the defined types were placed in this encounter.   Return in about 2 weeks (around 08/15/2020) for [facility] blood sugar log review; 3 months PCP Chronic disease management.        Charlott Rakes, MD, FAAFP. [facility] [place], [place]   08/01/2020, 10:11 AM

## 2020-08-01 NOTE — Telephone Encounter (Signed)
Done

## 2020-08-01 NOTE — Telephone Encounter (Signed)
If appropriate, may we change the Novolog to Humalog for DOH $0/PASS?

## 2020-08-01 NOTE — Addendum Note (Signed)
Addended by: Hoy Register on: 08/01/2020 11:28 AM   Modules accepted: Orders

## 2020-08-02 LAB — CMP14+EGFR
ALT: 20 IU/L (ref 0–32)
AST: 14 IU/L (ref 0–40)
Albumin/Globulin Ratio: 1.2 (ref 1.2–2.2)
Albumin: 3.8 g/dL — ABNORMAL LOW (ref 3.9–5.0)
Alkaline Phosphatase: 125 IU/L — ABNORMAL HIGH (ref 44–121)
BUN/Creatinine Ratio: 11 (ref 9–23)
BUN: 7 mg/dL (ref 6–20)
Bilirubin Total: 0.2 mg/dL (ref 0.0–1.2)
CO2: 22 mmol/L (ref 20–29)
Calcium: 9 mg/dL (ref 8.7–10.2)
Chloride: 101 mmol/L (ref 96–106)
Creatinine, Ser: 0.65 mg/dL (ref 0.57–1.00)
GFR calc Af Amer: 140 mL/min/{1.73_m2} (ref >59)
GFR calc non Af Amer: 121 mL/min/{1.73_m2} (ref >59)
Globulin, Total: 3.3 g/dL (ref 1.5–4.5)
Glucose: 228 mg/dL — ABNORMAL HIGH (ref 65–99)
Potassium: 4.5 mmol/L (ref 3.5–5.2)
Sodium: 137 mmol/L (ref 134–144)
Total Protein: 7.1 g/dL (ref 6.0–8.5)

## 2020-08-02 LAB — LIPID PANEL
Chol/HDL Ratio: 4.2 ratio (ref 0.0–4.4)
Cholesterol, Total: 167 mg/dL (ref 100–199)
HDL: 40 mg/dL (ref >39)
LDL Chol Calc (NIH): 108 mg/dL — ABNORMAL HIGH (ref 0–99)
Triglycerides: 102 mg/dL (ref 0–149)
VLDL Cholesterol Cal: 19 mg/dL (ref 5–40)

## 2020-08-02 LAB — MICROALBUMIN / CREATININE URINE RATIO
Creatinine, Urine: 69.1 mg/dL
Microalb/Creat Ratio: 14 mg/g creat (ref 0–29)
Microalbumin, Urine: 9.8 ug/mL

## 2020-08-03 ENCOUNTER — Telehealth: Payer: Self-pay

## 2020-08-03 NOTE — Telephone Encounter (Signed)
-----   Message from Hoy Register, MD sent at 08/03/2020  1:59 PM EST ----- Cholesterol, kidney and liver functions are normal except for elevated glucose for which we had adjusted her regimen at her last visit. Continue with a diabetic diet.

## 2020-08-03 NOTE — Telephone Encounter (Signed)
Patient name and DOB has been verified Patient was informed of lab results. Patient had no questions.  

## 2020-08-14 NOTE — Progress Notes (Unsigned)
    S:     PCP: Dr. Alvis Lemmings PMH: DM, sleep apnea, obesity, tobacco abuse  Patient arrives in good spirits. Presents for diabetes evaluation, education, and management Patient was referred and last seen by Primary Care Provider on 08/01/20. At that visit, minimal improvement in A1C 10.1% from 11.8% and pt reported FBG 200s. Lantus was increased from 40 units to 42 units daily, and can increased by 2 units every 4th day until BG at goal.   Patient reports Diabetes was diagnosed in ***.    FEMALE - [place]!  Today, patient reports ***   A1C = 10.1 Check Clinic BG Review medications and adherence (timing of meds, etc.)  Ate or drank anything prior to visit today? At home BGs?  Marland Kitchen Highs . Lows  Hyperglycemia sx (nocturia, neuropathy, visual changes, foot exams) Hypoglycemia symptoms (dizziness, shaky, sweating, hungry, confusion) Diet Exercise  Plan 1. Increase Trulicity to 3 mg if tolerated 2. Titrate Lantus  3. How much Novolog use with meals?  Family/Social History: -Fhx: Mother with kidney failure, HTM, DM, HF; Father with DM -Tobacco use: current every day smoker  Insurance coverage/medication affordability: ***  Medication adherence reported *** .   Current diabetes medications include: glipizide 20 mg daily, Novolog 0-12 units SSI TID, Trulicity 1.5 mg weekly, Lantus 42 units daily Current hypertension medications include: none Current hyperlipidemia medications include: none  Patient {Actions; denies-reports:120008} hypoglycemic events.  Patient reported dietary habits: Eats *** meals/day Breakfast:*** Lunch:*** Dinner:*** Snacks:*** Drinks:***  Patient-reported exercise habits: ***   Patient {Actions; denies-reports:120008} nocturia (nighttime urination).  Patient {Actions; denies-reports:120008} neuropathy (nerve pain). Patient {Actions; denies-reports:120008} visual changes. Patient {Actions; denies-reports:120008} self foot exams.     O:  POCT:  Home  fasting blood sugars: ***  2 hour post-meal/random blood sugars: ***.  Lab Results  Component Value Date   HGBA1C 10.1 (A) 08/01/2020   There were no vitals filed for this visit.  Lipid Panel     Component Value Date/Time   CHOL 167 08/01/2020 1033   TRIG 102 08/01/2020 1033   HDL 40 08/01/2020 1033   CHOLHDL 4.2 08/01/2020 1033   LDLCALC 108 (H) 08/01/2020 1033   Clinical Atherosclerotic Cardiovascular Disease (ASCVD): No  The ASCVD Risk score Denman George DC Jr., et al., 2013) failed to calculate for the following reasons:   The 2013 ASCVD risk score is only valid for ages 43 to 85    A/P: Diabetes longstanding*** currently ***. Patient is *** able to verbalize appropriate hypoglycemia management plan. Medication adherence appears ***. Control is suboptimal due to ***. - -Extensively discussed pathophysiology of diabetes, recommended lifestyle interventions, dietary effects on blood sugar control -Counseled on s/sx of and management of hypoglycemia -Next A1C anticipated ***.   ASCVD risk - primary prevention in patient with diabetes. Last LDL is controlled. .  -No statin indicated due to age <40 years  Written patient instructions provided.  Total time in face to face counseling *** minutes.   Follow up Pharmacist/PCP*** Clinic Visit in ***.     Fabio Neighbors, PharmD, BCPS PGY2 Ambulatory Care Resident [facility]  Pharmacy

## 2020-08-14 NOTE — Progress Notes (Deleted)
   S:     PCP: Dr. Alvis Lemmings PMH:  Patient arrives in good spirits. Presents to the clinic for hypertension evaluation, counseling, and management.  Patient was referred and last seen by Primary Care Provider on ***.  Patient was referred on ***.  Patient was last seen by Primary Care Provider on ***.   Medication adherence *** .  Current BP Medications include:  ***  Antihypertensives tried in the past include: ***  Dietary habits include: *** Exercise habits include:*** Family / Social history: ***   O:   Home BP readings: ***  Last 3 Office BP readings: BP Readings from Last 3 Encounters:  08/01/20 122/82  06/14/20 132/83  03/06/20 136/83    BMET    Component Value Date/Time   NA 137 08/01/2020 1033   K 4.5 08/01/2020 1033   CL 101 08/01/2020 1033   CO2 22 08/01/2020 1033   GLUCOSE 228 (H) 08/01/2020 1033   GLUCOSE 94 07/21/2019 0507   BUN 7 08/01/2020 1033   CREATININE 0.65 08/01/2020 1033   CALCIUM 9.0 08/01/2020 1033   GFRNONAA 121 08/01/2020 1033   GFRAA 140 08/01/2020 1033    Renal function: Estimated Creatinine Clearance (by C-G formula based on SCr of 0.65 mg/dL) Female: 161.0 mL/min Female: 184.7 mL/min  Clinical ASCVD: No  The ASCVD Risk score Denman George DC Jr., et al., 2013) failed to calculate for the following reasons:   The 2013 ASCVD risk score is only valid for ages 70 to 39  A/P: Hypertension longstanding currently *** on current medications. BP Goal = <130/80 mmHg. Medication adherence ***.  -{Meds adjust:18428} ***.  -F/u labs ordered - *** -Counseled on lifestyle modifications for blood pressure control including reduced dietary sodium, increased exercise, adequate sleep.  Results reviewed and written information provided.   Total time in face-to-face counseling *** minutes.   F/U Clinic Visit in ***.   Fabio Neighbors, PharmD, BCPS PGY2 Ambulatory Care Resident [facility]  Pharmacy

## 2020-08-15 ENCOUNTER — Ambulatory Visit: Payer: Self-pay | Admitting: Pharmacist

## 2020-09-14 ENCOUNTER — Other Ambulatory Visit: Payer: Self-pay | Admitting: Family Medicine

## 2020-09-14 ENCOUNTER — Other Ambulatory Visit: Payer: Self-pay

## 2020-09-14 ENCOUNTER — Encounter (HOSPITAL_BASED_OUTPATIENT_CLINIC_OR_DEPARTMENT_OTHER): Payer: Self-pay | Admitting: *Deleted

## 2020-09-14 ENCOUNTER — Emergency Department (HOSPITAL_BASED_OUTPATIENT_CLINIC_OR_DEPARTMENT_OTHER)
Admission: EM | Admit: 2020-09-14 | Discharge: 2020-09-15 | Disposition: A | Discharge status: Home / Self Care | Payer: Self-pay | Attending: Emergency Medicine | Admitting: Emergency Medicine

## 2020-09-14 DIAGNOSIS — Z794 Long term (current) use of insulin: Secondary | ICD-10-CM

## 2020-09-14 DIAGNOSIS — J45909 Unspecified asthma, uncomplicated: Secondary | ICD-10-CM | POA: Insufficient documentation

## 2020-09-14 DIAGNOSIS — Z20822 Contact with and (suspected) exposure to covid-19: Secondary | ICD-10-CM | POA: Insufficient documentation

## 2020-09-14 DIAGNOSIS — R0989 Other specified symptoms and signs involving the circulatory and respiratory systems: Secondary | ICD-10-CM | POA: Insufficient documentation

## 2020-09-14 DIAGNOSIS — E119 Type 2 diabetes mellitus without complications: Secondary | ICD-10-CM | POA: Insufficient documentation

## 2020-09-14 DIAGNOSIS — F17290 Nicotine dependence, other tobacco product, uncomplicated: Secondary | ICD-10-CM | POA: Insufficient documentation

## 2020-09-14 DIAGNOSIS — R059 Cough, unspecified: Secondary | ICD-10-CM | POA: Insufficient documentation

## 2020-09-14 DIAGNOSIS — Z7984 Long term (current) use of oral hypoglycemic drugs: Secondary | ICD-10-CM | POA: Insufficient documentation

## 2020-09-14 DIAGNOSIS — E1165 Type 2 diabetes mellitus with hyperglycemia: Secondary | ICD-10-CM

## 2020-09-14 MED ORDER — INSULIN GLARGINE 100 UNIT/ML ~~LOC~~ SOLN
42.0000 [IU] | Freq: Every day | SUBCUTANEOUS | 0 refills | Status: DC
  Filled 2020-09-14: qty 10, 23d supply, fill #0

## 2020-09-14 MED FILL — Dulaglutide Soln Auto-injector 1.5 MG/0.5ML: SUBCUTANEOUS | 28 days supply | Qty: 2 | Fill #0 | Status: AC

## 2020-09-14 MED FILL — Glipizide Tab ER 24HR 10 MG: ORAL | 30 days supply | Qty: 60 | Fill #0 | Status: AC

## 2020-09-14 NOTE — ED Notes (Signed)
RT eval in triage

## 2020-09-14 NOTE — Discharge Instructions (Addendum)
Person Under Monitoring Name: Diana Joyce  Location: [address] [place] [address]   Infection Prevention Recommendations for Individuals Confirmed to have, or Being Evaluated for, 2019 Novel Coronavirus (COVID-19) Infection Who Receive Care at Home  Individuals who are confirmed to have, or are being evaluated for, COVID-19 should follow the prevention steps below until a healthcare provider or local or state health department says they can return to normal activities.  Stay home except to get medical care You should restrict activities outside your home, except for getting medical care. Do not go to work, school, or public areas, and do not use public transportation or taxis.  Call ahead before visiting your doctor Before your medical appointment, call the healthcare provider and tell them that you have, or are being evaluated for, COVID-19 infection. This will help the healthcare provider's office take steps to keep other people from getting infected. Ask your healthcare provider to call the local or state health department.  Monitor your symptoms Seek prompt medical attention if your illness is worsening (e.g., difficulty breathing). Before going to your medical appointment, call the healthcare provider and tell them that you have, or are being evaluated for, COVID-19 infection. Ask your healthcare provider to call the local or state health department.  Wear a facemask You should wear a facemask that covers your nose and mouth when you are in the same room with other people and when you visit a healthcare provider. People who live with or visit you should also wear a facemask while they are in the same room with you.  Separate yourself from other people in your home As much as possible, you should stay in a different room from other people in your home. Also, you should use a separate bathroom, if available.  Avoid sharing household items You should not  share dishes, drinking glasses, cups, eating utensils, towels, bedding, or other items with other people in your home. After using these items, you should wash them thoroughly with soap and water.  Cover your coughs and sneezes Cover your mouth and nose with a tissue when you cough or sneeze, or you can cough or sneeze into your sleeve. Throw used tissues in a lined trash can, and immediately wash your hands with soap and water for at least 20 seconds or use an alcohol-based hand rub.  Wash your [employer] your hands often and thoroughly with soap and water for at least 20 seconds. You can use an alcohol-based hand sanitizer if soap and water are not available and if your hands are not visibly dirty. Avoid touching your eyes, nose, and mouth with unwashed hands.   Prevention Steps for Caregivers and Household Members of Individuals Confirmed to have, or Being Evaluated for, COVID-19 Infection Being Cared for in the Home  If you live with, or provide care at home for, a person confirmed to have, or being evaluated for, COVID-19 infection please follow these guidelines to prevent infection:  Follow healthcare provider's instructions Make sure that you understand and can help the patient follow any healthcare provider instructions for all care.  Provide for the patient's basic needs You should help the patient with basic needs in the home and provide support for getting groceries, prescriptions, and other personal needs.  Monitor the patient's symptoms If they are getting sicker, call his or her medical provider and tell them that the patient has, or is being evaluated for, COVID-19 infection. This will help the healthcare provider's office  take steps to keep other people from getting infected. Ask the healthcare provider to call the local or state health department.  Limit the number of people who have contact with the patient If possible, have only one caregiver for the  patient. Other household members should stay in another home or place of residence. If this is not possible, they should stay in another room, or be separated from the patient as much as possible. Use a separate bathroom, if available. Restrict visitors who do not have an essential need to be in the home.  Keep older adults, very young children, and other sick people away from the patient Keep older adults, very young children, and those who have compromised immune systems or chronic health conditions away from the patient. This includes people with chronic heart, lung, or kidney conditions, diabetes, and cancer.  Ensure good ventilation Make sure that shared spaces in the home have good air flow, such as from an air conditioner or an opened window, weather permitting.  Wash your hands often Wash your hands often and thoroughly with soap and water for at least 20 seconds. You can use an alcohol based hand sanitizer if soap and water are not available and if your hands are not visibly dirty. Avoid touching your eyes, nose, and mouth with unwashed hands. Use disposable paper towels to dry your hands. If not available, use dedicated cloth towels and replace them when they become wet.  Wear a facemask and gloves Wear a disposable facemask at all times in the room and gloves when you touch or have contact with the patient's blood, body fluids, and/or secretions or excretions, such as sweat, saliva, sputum, nasal mucus, vomit, urine, or feces.  Ensure the mask fits over your nose and mouth tightly, and do not touch it during use. Throw out disposable facemasks and gloves after using them. Do not reuse. Wash your hands immediately after removing your facemask and gloves. If your personal clothing becomes contaminated, carefully remove clothing and launder. Wash your hands after handling contaminated clothing. Place all used disposable facemasks, gloves, and other waste in a lined container before  disposing them with other household waste. Remove gloves and wash your hands immediately after handling these items.  Do not share dishes, glasses, or other household items with the patient Avoid sharing household items. You should not share dishes, drinking glasses, cups, eating utensils, towels, bedding, or other items with a patient who is confirmed to have, or being evaluated for, COVID-19 infection. After the person uses these items, you should wash them thoroughly with soap and water.  Wash laundry thoroughly Immediately remove and wash clothes or bedding that have blood, body fluids, and/or secretions or excretions, such as sweat, saliva, sputum, nasal mucus, vomit, urine, or feces, on them. Wear gloves when handling laundry from the patient. Read and follow directions on labels of laundry or clothing items and detergent. In general, wash and dry with the warmest temperatures recommended on the label.  Clean all areas the individual has used often Clean all touchable surfaces, such as counters, tabletops, doorknobs, bathroom fixtures, toilets, phones, keyboards, tablets, and bedside tables, every day. Also, clean any surfaces that may have blood, body fluids, and/or secretions or excretions on them. Wear gloves when cleaning surfaces the patient has come in contact with. Use a diluted bleach solution (e.g., dilute bleach with 1 part bleach and 10 parts water) or a household disinfectant with a label that says EPA-registered for coronaviruses. To make a bleach  solution at home, add 1 tablespoon of bleach to 1 quart (4 cups) of water. For a larger supply, add  cup of bleach to 1 gallon (16 cups) of water. Read labels of cleaning products and follow recommendations provided on product labels. Labels contain instructions for safe and effective use of the cleaning product including precautions you should take when applying the product, such as wearing gloves or eye protection and making sure you  have good ventilation during use of the product. Remove gloves and wash hands immediately after cleaning.  Monitor yourself for signs and symptoms of illness Caregivers and household members are considered close contacts, should monitor their health, and will be asked to limit movement outside of the home to the extent possible. Follow the monitoring steps for close contacts listed on the symptom monitoring form.   ? If you have additional questions, contact your local health department or call the epidemiologist on call at [phone] (available 24/7). ? This guidance is subject to change. For the most up-to-date guidance from [facility], please refer to their website: [email or web address]

## 2020-09-14 NOTE — ED Provider Notes (Signed)
[facility] EMERGENCY DEPARTMENT Provider Note   CSN: [id number] Arrival date & time: 09/14/20  2147     History Chief Complaint  Patient presents with  . URI    Diana Joyce is a 30 y.o. adult.  The history is provided by the patient.  URI Presenting symptoms: congestion and cough   Presenting symptoms: no fatigue and no fever   Severity:  Mild Onset quality:  Gradual Duration:  1 week Timing:  Constant Progression:  Unchanged Chronicity:  New Relieved by:  Nothing Worsened by:  Nothing Ineffective treatments:  None tried Associated symptoms: no arthralgias, no headaches, no myalgias, no neck pain, no sinus pain, no sneezing, no swollen glands and no wheezing   Risk factors: sick contacts   Risk factors: not elderly and no immunosuppression        Past Medical History:  Diagnosis Date  . Apnea, sleep   . Asthma   . Diabetes [facility])     Patient Active Problem List   Diagnosis Date Noted  . Tobacco abuse 03/06/2020  . Sleep apnea 12/07/2017  . Class 3 severe obesity due to excess calories without serious comorbidity with body mass index (BMI) of 50.0 to 59.9 in adult ([place]) 06/19/2017  . Diabetes [facility])     Past Surgical History:  Procedure Laterality Date  . FRACTURE SURGERY       OB History   No obstetric history on file.     Family History  Problem Relation Age of Onset  . Kidney failure Mother   . Hypertension Mother   . Diabetes Mother   . Heart failure Mother   . Diabetes Father     Social History   Tobacco Use  . Smoking status: Current Every Day Smoker    Types: Cigars  . Smokeless tobacco: Never Used  Substance Use Topics  . Alcohol use: Yes  . Drug use: No    Home Medications Prior to Admission medications   Medication Sig Start Date End Date Taking? Authorizing Provider  albuterol (VENTOLIN HFA) 108 (90 Base) MCG/ACT inhaler Inhale 2 puffs into the lungs every 6 (six) hours as needed for wheezing or shortness of  breath. 10/12/19   Charlott Rakes, MD  aluminum chloride (DRYSOL) 20 % external solution Apply topically at bedtime. Patient not taking: Reported on 08/01/2020 03/06/20   Charlott Rakes, MD  blood glucose meter kit and supplies KIT Dispense with test strips and lancets based on insurance preference. Use up to four times daily as directed. (FOR ICD-9 250.00, 250.01). 12/07/17   Charlott Rakes, MD  Blood Glucose Monitoring Suppl (TRUE METRIX METER) w/Device KIT Check blood sugars fasting and at bedtime daily 05/07/17   Argentina Donovan, PA-C  Dulaglutide 1.5 MG/0.5ML SOPN INJECT 1.5 MG INTO THE SKIN ONCE A WEEK. 08/01/20 08/01/21  Charlott Rakes, MD  glipiZIDE (GLUCOTROL XL) 10 MG 24 hr tablet TAKE 2 TABLETS (20 MG TOTAL) BY MOUTH DAILY WITH BREAKFAST. 08/01/20 08/01/21  Charlott Rakes, MD  glucose blood test strip USE AS INSTRUCTED 01/16/20 01/15/21  Charlott Rakes, MD  insulin glargine (LANTUS) 100 UNIT/ML injection Inject 0.42 mLs (42 Units total) into the skin at bedtime. 08/01/20 12/12/20  Charlott Rakes, MD  insulin glargine (LANTUS) 100 UNIT/ML injection Inject 42 units into the skin. 08/01/20   Charlott Rakes, MD  insulin lispro (HUMALOG) 100 UNIT/ML KwikPen INJECT 0-12 UNITS INTO THE SKIN 3 (THREE) TIMES DAILY. ACCORDING TO SLIDING SCALE 08/01/20 08/01/21  Charlott Rakes, MD  insulin lispro (  HUMALOG) 100 UNIT/ML KwikPen INJECT 0 TO 12 UNITS 3 TIMES DAILY BEFORE MEALS AS PER SLIDING SCALE 10/12/19 10/11/20  Charlott Rakes, MD  Insulin Pen Needle (PEN NEEDLES) 32G X 4 MM MISC 1 Dose by Does not apply route daily. 04/13/19   Charlott Rakes, MD  Norgestimate-Ethinyl Estradiol Triphasic (ORTHO TRI-CYCLEN LO) 0.18/0.215/0.25 MG-25 MCG tab Take 1 tablet by mouth daily. Patient not taking: No sig reported 07/13/18   Charlott Rakes, MD  nystatin (MYCOSTATIN/NYSTOP) powder Apply 1 application topically 3 (three) times daily. 03/06/20   Charlott Rakes, MD  testosterone cypionate (DEPOTESTOSTERONE CYPIONATE) 200 MG/ML  injection INJECT 0.375 MLS UNDER THE SKIN ONCE A WEEK. 03/30/20 09/26/20  Chuchuru, Rachel Bo, CNM  TRUEPLUS LANCETS 28G MISC Check blood sugars fasting and at bedtime 05/07/17   Argentina Donovan, PA-C    Allergies    Benadryl [diphenhydramine hcl]  Review of Systems   Review of Systems  Constitutional: Negative for fatigue and fever.  HENT: Positive for congestion. Negative for sinus pain and sneezing.   Eyes: Negative for visual disturbance.  Respiratory: Positive for cough. Negative for wheezing.   Gastrointestinal: Negative for abdominal pain.  Genitourinary: Negative for difficulty urinating.  Musculoskeletal: Negative for arthralgias, myalgias and neck pain.  Skin: Negative for rash.  Neurological: Negative for headaches.  Psychiatric/Behavioral: Negative for agitation.  All other systems reviewed and are negative.   Physical Exam Updated Vital Signs BP (!) 174/97   Pulse (!) 112   Temp 98 F (36.7 C) (Oral)   Resp 18   Ht 5' 5"  (1.651 m)   Wt (!) 145.2 kg   LMP 09/14/2020   SpO2 100%   BMI 53.25 kg/m   Physical Exam Vitals and nursing note reviewed.  Constitutional:      Appearance: Normal appearance. He is not ill-appearing.  HENT:     Head: Normocephalic and atraumatic.     Nose: Nose normal.  Eyes:     Conjunctiva/sclera: Conjunctivae normal.     Pupils: Pupils are equal, round, and reactive to light.  Cardiovascular:     Rate and Rhythm: Normal rate and regular rhythm.     Pulses: Normal pulses.     Heart sounds: Normal heart sounds.  Pulmonary:     Effort: Pulmonary effort is normal.     Breath sounds: Normal breath sounds.  Abdominal:     General: Abdomen is flat. Bowel sounds are normal.     Palpations: Abdomen is soft.     Tenderness: There is no abdominal tenderness. There is no guarding.  Musculoskeletal:        General: Normal range of motion.     Cervical back: Normal range of motion and neck supple.  Skin:    General: Skin is warm and  dry.     Capillary Refill: Capillary refill takes less than 2 seconds.  Neurological:     General: No focal deficit present.     Mental Status: He is alert and oriented to person, place, and time.     Deep Tendon Reflexes: Reflexes normal.  Psychiatric:        Mood and Affect: Mood normal.        Behavior: Behavior normal.     ED Results / Procedures / Treatments   Labs (all labs ordered are listed, but only abnormal results are displayed) Labs Reviewed  SARS CORONAVIRUS 2 (TAT 6-24 HRS)    EKG None  Radiology No results found.  Procedures Procedures   Medications Ordered  in ED Medications - No data to display  ED Course  I have reviewed the triage vital signs and the nursing notes.  Pertinent labs & imaging results that were available during my care of the patient were reviewed by me and considered in my medical decision making (see chart for details).   Patient exposed to multiple sick people with symptoms of covid.  Alternate tylenol and ibuprofen.  Work note given.  Well appearing stable for discharge with close follow up.    Diana Joyce was evaluated in Emergency Department on 09/14/2020 for the symptoms described in the history of present illness. He was evaluated in the context of the global COVID-19 pandemic, which necessitated consideration that the patient might be at risk for infection with the SARS-CoV-2 virus that causes COVID-19. Institutional protocols and algorithms that pertain to the evaluation of patients at risk for COVID-19 are in a state of rapid change based on information released by regulatory bodies including the CDC and federal and state organizations. These policies and algorithms were followed during the patient's care in the ED.  Final Clinical Impression(s) / ED Diagnoses Return for intractable cough, coughing up blood, fevers >100.4 unrelieved by medication, shortness of breath, intractable vomiting, chest pain, shortness of breath, weakness,  numbness, changes in speech, facial asymmetry, abdominal pain, passing out, Inability to tolerate liquids or food, cough, altered mental status or any concerns. No signs of systemic illness or infection. The patient is nontoxic-appearing on exam and vital signs are within normal limits.  I have reviewed the triage vital signs and the nursing notes. Pertinent labs & imaging results that were available during my care of the patient were reviewed by me and considered in my medical decision making (see chart for details). After history, exam, and medical workup I feel the patient has been appropriately medically screened and is safe for discharge home. Pertinent diagnoses were discussed with the patient. Patient was given return precautions.      Aileene Lanum, MD 09/14/20 2353

## 2020-09-15 LAB — SARS CORONAVIRUS 2 (TAT 6-24 HRS): SARS Coronavirus 2: NEGATIVE

## 2020-09-15 MED FILL — Testosterone Cypionate IM Inj in Oil 200 MG/ML: INTRAMUSCULAR | 42 days supply | Qty: 6 | Fill #0 | Status: AC

## 2020-09-17 ENCOUNTER — Other Ambulatory Visit: Payer: Self-pay

## 2020-09-17 ENCOUNTER — Other Ambulatory Visit (HOSPITAL_COMMUNITY): Payer: Self-pay

## 2020-10-12 ENCOUNTER — Other Ambulatory Visit: Payer: Self-pay

## 2020-10-12 MED FILL — Dulaglutide Soln Auto-injector 1.5 MG/0.5ML: SUBCUTANEOUS | 28 days supply | Qty: 2 | Fill #1 | Status: AC

## 2020-10-16 ENCOUNTER — Other Ambulatory Visit: Payer: Self-pay

## 2020-10-29 ENCOUNTER — Other Ambulatory Visit: Payer: Self-pay

## 2020-10-29 ENCOUNTER — Ambulatory Visit: Payer: Self-pay | Attending: Family Medicine | Admitting: Family Medicine

## 2020-10-29 ENCOUNTER — Encounter: Payer: Self-pay | Admitting: Family Medicine

## 2020-10-29 ENCOUNTER — Other Ambulatory Visit: Payer: Self-pay | Admitting: Pharmacist

## 2020-10-29 VITALS — BP 129/80 | HR 100 | Ht 65.0 in | Wt 317.4 lb

## 2020-10-29 DIAGNOSIS — E1165 Type 2 diabetes mellitus with hyperglycemia: Secondary | ICD-10-CM

## 2020-10-29 DIAGNOSIS — Z794 Long term (current) use of insulin: Secondary | ICD-10-CM

## 2020-10-29 LAB — POCT GLYCOSYLATED HEMOGLOBIN (HGB A1C): HbA1c, POC (controlled diabetic range): 12.1 % — AB (ref 0.0–7.0)

## 2020-10-29 LAB — GLUCOSE, POCT (MANUAL RESULT ENTRY): POC Glucose: 154 mg/dl — AB (ref 70–99)

## 2020-10-29 MED ORDER — TRUE METRIX METER W/DEVICE KIT
PACK | 1 refills | Status: DC
  Filled 2020-10-29: qty 1, 365d supply, fill #0

## 2020-10-29 MED ORDER — TRULICITY 3 MG/0.5ML ~~LOC~~ SOAJ
3.0000 mg | SUBCUTANEOUS | 6 refills | Status: DC
  Filled 2020-10-29: qty 2, 28d supply, fill #0

## 2020-10-29 MED ORDER — TRUEPLUS LANCETS 28G MISC
1.0000 | Freq: Three times a day (TID) | 12 refills | Status: DC
  Filled 2020-10-29: qty 100, 33d supply, fill #0

## 2020-10-29 MED ORDER — TRULICITY 3 MG/0.5ML ~~LOC~~ SOAJ: 3.0000 mg | SUBCUTANEOUS | 6 refills | Status: DC

## 2020-10-29 MED ORDER — INSULIN GLARGINE 100 UNIT/ML ~~LOC~~ SOLN
45.0000 [IU] | Freq: Every day | SUBCUTANEOUS | 0 refills | Status: DC
  Filled 2020-10-29: qty 20, 44d supply, fill #0
  Filled 2020-12-15: qty 30, 66d supply, fill #1
  Filled 2021-02-26: qty 30, 66d supply, fill #2

## 2020-10-29 MED FILL — Glipizide Tab ER 24HR 10 MG: ORAL | 30 days supply | Qty: 60 | Fill #1 | Status: AC

## 2020-10-29 NOTE — Progress Notes (Signed)
Subjective:  Patient ID: Diana Joyce, adult    DOB: 07/01/1990  Age: 30 y.o. MRN: [id number]  CC: Diabetes   HPI Diana Joyce is a 30 year old[facility]with type 2 diabetes mellitus (A1c11.8), asthma, morbid obesity here for follow-up visit He started testosterone 07/25/19.  Interval History:  His A1c 12.1 up from 11.8 previously. This morning his fasting sugar was 109 and it is 154 and he is wondering why there is a discrepancy.  5 hours ago he did have a drink of Gatorade. He drinks 2 energy drinks to stay awake at work as he works night shift. Endorses compliance with his medications but does not get much exercise.  Up-to-date on annual eye exams. Denies presence of numbness or tingling in extremities and has had no hypoglycemic symptoms. Past Medical History:  Diagnosis Date  . Apnea, sleep   . Asthma   . Diabetes [facility])     Past Surgical History:  Procedure Laterality Date  . FRACTURE SURGERY      Family History  Problem Relation Age of Onset  . Kidney failure Mother   . Hypertension Mother   . Diabetes Mother   . Heart failure Mother   . Diabetes Father     Allergies  Allergen Reactions  . Benadryl [Diphenhydramine Hcl] Other (See Comments)    Hives, coughing, itching.     Outpatient Medications Prior to Visit  Medication Sig Dispense Refill  . albuterol (VENTOLIN HFA) 108 (90 Base) MCG/ACT inhaler Inhale 2 puffs into the lungs every 6 (six) hours as needed for wheezing or shortness of breath. 18 g 2  . blood glucose meter kit and supplies KIT Dispense with test strips and lancets based on insurance preference. Use up to four times daily as directed. (FOR ICD-9 250.00, 250.01). 100 each 12  . glipiZIDE (GLUCOTROL XL) 10 MG 24 hr tablet TAKE 2 TABLETS (20 MG TOTAL) BY MOUTH DAILY WITH BREAKFAST. 180 tablet 1  . glucose blood test strip USE AS INSTRUCTED 100 strip 11  .  insulin lispro (HUMALOG) 100 UNIT/ML KwikPen INJECT 0-12 UNITS INTO THE SKIN 3 (THREE) TIMES DAILY. ACCORDING TO SLIDING SCALE 30 mL 6  . Insulin Pen Needle (PEN NEEDLES) 32G X 4 MM MISC 1 Dose by Does not apply route daily. 100 each 1  . testosterone cypionate (DEPOTESTOSTERONE CYPIONATE) 200 MG/ML injection Inject 0.375 mLs (75 mg total) into the skin once a week. 6 mL 2  . TRUEPLUS LANCETS 28G MISC Check blood sugars fasting and at bedtime 100 each 1  . Blood Glucose Monitoring Suppl (TRUE METRIX METER) w/Device KIT Check blood sugars fasting and at bedtime daily 1 kit 1  . Dulaglutide 1.5 MG/0.5ML SOPN INJECT 1.5 MG INTO THE SKIN ONCE A WEEK. 2 mL 6  . insulin glargine (LANTUS) 100 UNIT/ML injection Inject 0.42 mLs (42 Units total) into the skin at bedtime. 30 mL 6  . insulin glargine (LANTUS) 100 UNIT/ML injection Inject 42 units into the skin. 190 mL 0  . aluminum chloride (DRYSOL) 20 % external solution Apply topically at bedtime. (Patient not taking: No sig reported) 35 mL 1  . insulin lispro (HUMALOG) 100 UNIT/ML KwikPen INJECT 0 TO 12 UNITS 3 TIMES DAILY BEFORE MEALS AS PER SLIDING SCALE 15 mL 6  . Norgestimate-Ethinyl Estradiol Triphasic (ORTHO TRI-CYCLEN LO) 0.18/0.215/0.25 MG-25 MCG tab Take 1 tablet by mouth daily. (Patient not taking: No sig reported) 1 Package 11  . nystatin (MYCOSTATIN/NYSTOP)  powder Apply 1 application topically 3 (three) times daily. (Patient not taking: Reported on 10/29/2020) 60 g 1   No facility-administered medications prior to visit.     ROS Review of Systems  Constitutional: Negative for activity change, appetite change and fatigue.  HENT: Negative for congestion, sinus pressure and sore throat.   Eyes: Negative for visual disturbance.  Respiratory: Negative for cough, chest tightness, shortness of breath and wheezing.   Cardiovascular: Negative for chest pain and palpitations.  Gastrointestinal: Negative for abdominal distention, abdominal pain and  constipation.  Endocrine: Negative for polydipsia.  Genitourinary: Negative for dysuria and frequency.  Musculoskeletal: Negative for arthralgias and back pain.  Skin: Negative for rash.  Neurological: Negative for tremors, light-headedness and numbness.  Hematological: Does not bruise/bleed easily.  Psychiatric/Behavioral: Negative for agitation and behavioral problems.    Objective:  BP 129/80   Pulse 100   Ht 5' 5"  (1.651 m)   Wt (!) 317 lb 6.4 oz (144 kg)   SpO2 99%   BMI 52.82 kg/m   BP/Weight 10/29/2020 09/14/2020 10/24/6158  Systolic BP 737 106 269  Diastolic BP 80 97 82  Wt. (Lbs) 317.4 320 320  BMI 52.82 53.25 53.25      Physical Exam Constitutional:      Appearance: He is well-developed.  Neck:     Vascular: No JVD.  Cardiovascular:     Rate and Rhythm: Normal rate.     Heart sounds: Normal heart sounds. No murmur heard.   Pulmonary:     Effort: Pulmonary effort is normal.     Breath sounds: Normal breath sounds. No wheezing or rales.  Chest:     Chest wall: No tenderness.  Abdominal:     General: Bowel sounds are normal. There is no distension.     Palpations: Abdomen is soft. There is no mass.     Tenderness: There is no abdominal tenderness.  Musculoskeletal:        General: Normal range of motion.     Right lower leg: No edema.     Left lower leg: No edema.  Neurological:     Mental Status: He is alert and oriented to person, place, and time.  Psychiatric:        Mood and Affect: Mood normal.     CMP Latest Ref Rng & Units 08/01/2020 03/06/2020 10/12/2019  Glucose 65 - 99 mg/dL 228(H) 253(H) 82  BUN 6 - 20 mg/dL 7 6 8   Creatinine 0.57 - 1.00 mg/dL 0.65 0.62 0.62  Sodium 134 - 144 mmol/L 137 136 138  Potassium 3.5 - 5.2 mmol/L 4.5 4.4 4.0  Chloride 96 - 106 mmol/L 101 101 97  CO2 20 - 29 mmol/L 22 25 21   Calcium 8.7 - 10.2 mg/dL 9.0 8.9 9.2  Total Protein 6.0 - 8.5 g/dL 7.1 - 7.3  Total Bilirubin 0.0 - 1.2 mg/dL 0.2 - 0.3  Alkaline Phos 44 -  121 IU/L 125(H) - 101  AST 0 - 40 IU/L 14 - 19  ALT 0 - 32 IU/L 20 - 17    Lipid Panel     Component Value Date/Time   CHOL 167 08/01/2020 1033   TRIG 102 08/01/2020 1033   HDL 40 08/01/2020 1033   CHOLHDL 4.2 08/01/2020 1033   LDLCALC 108 (H) 08/01/2020 1033    CBC    Component Value Date/Time   WBC 10.6 (H) 07/21/2019 0507   RBC 4.79 07/21/2019 0507   HGB 11.3 (L) 07/21/2019 0507  HCT 36.4 07/21/2019 0507   PLT 415 (H) 07/21/2019 0507   MCV 76.0 (L) 07/21/2019 0507   MCH 23.6 (L) 07/21/2019 0507   MCHC 31.0 07/21/2019 0507   RDW 15.3 07/21/2019 0507   LYMPHSABS 2.7 06/07/2019 1920   MONOABS 0.6 06/07/2019 1920   EOSABS 0.1 06/07/2019 1920   BASOSABS 0.0 06/07/2019 1920    Lab Results  Component Value Date   HGBA1C 12.1 (A) 10/29/2020    Assessment & Plan:  1. Type 2 diabetes mellitus with hyperglycemia, with long-term current use of insulin (HCC) Uncontrolled with A1c of 12.1; goal is less than 7.0 Ingestion of energy drinks and noncompliance with a diabetic diet likely contributory Unfortunately coffee does not keep him awake.  Advised to cut down on number of energy drinks consumed or substitute with low sugar drinks. Increase Lantus dose from 42 units to 45 units and will also increase Trulicity dose. He will follow-up with the clinical pharmacist for up titration of Lantus dose. Sent in a new prescription for a glucometer Counseled on Diabetic diet, my plate method, 094 minutes of moderate intensity exercise/week Blood sugar logs with fasting goals of 80-120 mg/dl, random of less than 180 and in the event of sugars less than 60 mg/dl or greater than 400 mg/dl encouraged to notify the clinic. Advised on the need for annual eye exams, annual foot exams, Pneumonia vaccine. - POCT glucose (manual entry) - POCT glycosylated hemoglobin (Hb A1C) - Blood Glucose Monitoring Suppl (TRUE METRIX METER) w/Device KIT; Check blood sugars fasting and at bedtime daily   Dispense: 1 kit; Refill: 1 - insulin glargine (LANTUS) 100 UNIT/ML injection; Inject 0.45 mLs (45 Units total) into the skin daily.  Dispense: 190 mL; Refill: 0 - TRUEplus Lancets 28G MISC; Use as directed 3 (three) times daily before meals.  Dispense: 100 each; Refill: 12    Meds ordered this encounter  Medications  . Blood Glucose Monitoring Suppl (TRUE METRIX METER) w/Device KIT    Sig: Check blood sugars fasting and at bedtime daily    Dispense:  1 kit    Refill:  1  . insulin glargine (LANTUS) 100 UNIT/ML injection    Sig: Inject 0.45 mLs (45 Units total) into the skin daily.    Dispense:  190 mL    Refill:  0  . DISCONTD: Dulaglutide (TRULICITY) 3 MH/6.8GS SOPN    Sig: Inject 3 mg as directed once a week.    Dispense:  2 mL    Refill:  6    Dose change  . TRUEplus Lancets 28G MISC    Sig: Use as directed 3 (three) times daily before meals.    Dispense:  100 each    Refill:  12    Please dispense test strips    Follow-up: Return in about 2 weeks (around 11/12/2020) for Follow-up for blood sugar evaluation; 3 months PCP.       Charlott Rakes, MD, FAAFP. [facility] [place] [employer]   10/29/2020, 12:31 PM

## 2020-10-29 NOTE — Patient Instructions (Signed)
Diabetes Mellitus and Nutrition, Adult When you have diabetes, or diabetes mellitus, it is very important to have healthy eating habits because your blood sugar (glucose) levels are greatly affected by what you eat and drink. Eating healthy foods in the right amounts, at about the same times every day, can help you:  Control your blood glucose.  Lower your risk of heart disease.  Improve your blood pressure.  Reach or maintain a healthy weight. What can affect my meal plan? Every person with diabetes is different, and each person has different needs for a meal plan. Your health care provider may recommend that you work with a dietitian to make a meal plan that is best for you. Your meal plan may vary depending on factors such as:  The calories you need.  The medicines you take.  Your weight.  Your blood glucose, blood pressure, and cholesterol levels.  Your activity level.  Other health conditions you have, such as heart or kidney disease. How do carbohydrates affect me? Carbohydrates, also called carbs, affect your blood glucose level more than any other type of food. Eating carbs naturally raises the amount of glucose in your blood. Carb counting is a method for keeping track of how many carbs you eat. Counting carbs is important to keep your blood glucose at a healthy level, especially if you use insulin or take certain oral diabetes medicines. It is important to know how many carbs you can safely have in each meal. This is different for every person. Your dietitian can help you calculate how many carbs you should have at each meal and for each snack. How does alcohol affect me? Alcohol can cause a sudden decrease in blood glucose (hypoglycemia), especially if you use insulin or take certain oral diabetes medicines. Hypoglycemia can be a life-threatening condition. Symptoms of hypoglycemia, such as sleepiness, dizziness, and confusion, are similar to symptoms of having too much  alcohol.  Do not drink alcohol if: ? Your health care provider tells you not to drink. ? You are pregnant, may be pregnant, or are planning to become pregnant.  If you drink alcohol: ? Do not drink on an empty stomach. ? Limit how much you use to:  0-1 drink a day for women.  0-2 drinks a day for men. ? Be aware of how much alcohol is in your drink. In the [place] one drink equals one 12 oz bottle of beer (355 mL), one 5 oz glass of wine (148 mL), or one 1 oz glass of hard liquor (44 mL). ? Keep yourself hydrated with water, diet soda, or unsweetened iced tea.  Keep in mind that regular soda, juice, and other mixers may contain a lot of sugar and must be counted as carbs. What are tips for following this plan? Reading food labels  Start by checking the serving size on the "Nutrition Facts" label of packaged foods and drinks. The amount of calories, carbs, fats, and other nutrients listed on the label is based on one serving of the item. Many items contain more than one serving per package.  Check the total grams (g) of carbs in one serving. You can calculate the number of servings of carbs in one serving by dividing the total carbs by 15. For example, if a food has 30 g of total carbs per serving, it would be equal to 2 servings of carbs.  Check the number of grams (g) of saturated fats and trans fats in one serving. Choose foods that have   a low amount or none of these fats.  Check the number of milligrams (mg) of salt (sodium) in one serving. Most people should limit total sodium intake to less than 2,300 mg per day.  Always check the nutrition information of foods labeled as "low-fat" or "nonfat." These foods may be higher in added sugar or refined carbs and should be avoided.  Talk to your dietitian to identify your daily goals for nutrients listed on the label. Shopping  Avoid buying canned, pre-made, or processed foods. These foods tend to be high in fat, sodium, and added  sugar.  Shop around the outside edge of the grocery store. This is where you will most often find fresh fruits and vegetables, bulk grains, fresh meats, and fresh dairy. Cooking  Use low-heat cooking methods, such as baking, instead of high-heat cooking methods like deep frying.  Cook using healthy oils, such as olive, canola, or sunflower oil.  Avoid cooking with butter, cream, or high-fat meats. Meal planning  Eat meals and snacks regularly, preferably at the same times every day. Avoid going long periods of time without eating.  Eat foods that are high in fiber, such as fresh fruits, vegetables, beans, and whole grains. Talk with your dietitian about how many servings of carbs you can eat at each meal.  Eat 4-6 oz (112-168 g) of lean protein each day, such as lean meat, chicken, fish, eggs, or tofu. One ounce (oz) of lean protein is equal to: ? 1 oz (28 g) of meat, chicken, or fish. ? 1 egg. ?  cup (62 g) of tofu.  Eat some foods each day that contain healthy fats, such as avocado, nuts, seeds, and fish.   What foods should I eat? Fruits Berries. Apples. Oranges. Peaches. Apricots. Plums. Grapes. Mango. Papaya. Pomegranate. Kiwi. Cherries. Vegetables Lettuce. Spinach. Leafy greens, including kale, chard, collard greens, and mustard greens. Beets. Cauliflower. Cabbage. Broccoli. Carrots. Green beans. Tomatoes. Peppers. Onions. Cucumbers. Brussels sprouts. Grains Whole grains, such as whole-wheat or whole-grain bread, crackers, tortillas, cereal, and pasta. Unsweetened oatmeal. Quinoa. Brown or wild rice. Meats and other proteins Seafood. Poultry without skin. Lean cuts of poultry and beef. Tofu. Nuts. Seeds. Dairy Low-fat or fat-free dairy products such as milk, yogurt, and cheese. The items listed above may not be a complete list of foods and beverages you can eat. Contact a dietitian for more information. What foods should I avoid? Fruits Fruits canned with  syrup. Vegetables Canned vegetables. Frozen vegetables with butter or cream sauce. Grains Refined white flour and flour products such as bread, pasta, snack foods, and cereals. Avoid all processed foods. Meats and other proteins Fatty cuts of meat. Poultry with skin. Breaded or fried meats. Processed meat. Avoid saturated fats. Dairy Full-fat yogurt, cheese, or milk. Beverages Sweetened drinks, such as soda or iced tea. The items listed above may not be a complete list of foods and beverages you should avoid. Contact a dietitian for more information. Questions to ask a health care provider  Do I need to meet with a diabetes educator?  Do I need to meet with a dietitian?  What number can I call if I have questions?  When are the best times to check my blood glucose? Where to find more information:  American Diabetes Association: diabetes.org  Academy of Nutrition and Dietetics: www.eatright.org  National Institute of Diabetes and Digestive and Kidney Diseases: www.niddk.nih.gov  Association of Diabetes Care and Education Specialists: www.diabeteseducator.org Summary  It is important to have healthy eating   habits because your blood sugar (glucose) levels are greatly affected by what you eat and drink.  A healthy meal plan will help you control your blood glucose and maintain a healthy lifestyle.  Your health care provider may recommend that you work with a dietitian to make a meal plan that is best for you.  Keep in mind that carbohydrates (carbs) and alcohol have immediate effects on your blood glucose levels. It is important to count carbs and to use alcohol carefully. This information is not intended to replace advice given to you by your health care provider. Make sure you discuss any questions you have with your health care provider. Document Revised: 05/03/2019 Document Reviewed: 05/03/2019 Elsevier Patient Education  2021 Elsevier Inc.  

## 2020-11-08 ENCOUNTER — Other Ambulatory Visit: Payer: Self-pay

## 2020-11-08 ENCOUNTER — Encounter: Payer: Self-pay | Admitting: Family Medicine

## 2020-11-09 ENCOUNTER — Other Ambulatory Visit: Payer: Self-pay

## 2020-11-09 MED ORDER — TRULICITY 3 MG/0.5ML ~~LOC~~ SOAJ
3.0000 mg | SUBCUTANEOUS | 6 refills | Status: DC
  Filled 2020-11-09: qty 2, 28d supply, fill #0
  Filled 2020-11-09: qty 6, 84d supply, fill #0

## 2020-11-12 ENCOUNTER — Other Ambulatory Visit: Payer: Self-pay

## 2020-11-12 ENCOUNTER — Encounter: Payer: Self-pay | Admitting: Pharmacist

## 2020-11-12 ENCOUNTER — Ambulatory Visit: Payer: Self-pay | Attending: Family Medicine | Admitting: Pharmacist

## 2020-11-12 DIAGNOSIS — E1165 Type 2 diabetes mellitus with hyperglycemia: Secondary | ICD-10-CM

## 2020-11-12 DIAGNOSIS — Z794 Long term (current) use of insulin: Secondary | ICD-10-CM

## 2020-11-12 LAB — GLUCOSE, POCT (MANUAL RESULT ENTRY): POC Glucose: 82 mg/dl (ref 70–99)

## 2020-11-12 NOTE — Progress Notes (Signed)
    S:    PCP: Dr. Alvis Lemmings   No chief complaint on file.  Patient arrives in good spirits. Presents for diabetes evaluation, education, and management Patient was referred and last seen by Primary Care Provider on 10/29/2020. Trulicity was increased at that visit. Lantus was increase from 42 to 45 units daily.  Family/Social History:  -FHx: kidney failure, HTN, DM, CHF -Tobacco: current every day smoker  -Alcohol: occasional   Insurance coverage/medication affordability: self pay  Medication adherence reported.   Current diabetes medications include: dulaglutide 3 mg weekly,glipizide 10 mg XL (pt takes 20mg  daily), Lantus 45 units daily, Humalog 0-12 units TID per sliding scale (uses 2-4 units with most meals)  Patient denies hypoglycemic events.  Patient reported dietary habits:  -Switching to sugar-free energy drinks -Trying to adhere more to a diabetic diet   Patient-reported exercise habits:  -Mostly active at work    Patient denies nocturia (nighttime urination).  Patient denies neuropathy (nerve pain). Patient denies visual changes. Patient reports self foot exams.     O:  POCT: 82  Lab Results  Component Value Date   HGBA1C 12.1 (A) 10/29/2020   There were no vitals filed for this visit.  Lipid Panel     Component Value Date/Time   CHOL 167 08/01/2020 1033   TRIG 102 08/01/2020 1033   HDL 40 08/01/2020 1033   CHOLHDL 4.2 08/01/2020 1033   LDLCALC 108 (H) 08/01/2020 1033   Home fasting blood sugars: low 100s 2 hour post-meal/random blood sugars: 100s. No readings >200 since Trulicity change  Clinical Atherosclerotic Cardiovascular Disease (ASCVD): No  The ASCVD Risk score 08/03/2020 DC Jr., et al., 2013) failed to calculate for the following reasons:   The 2013 ASCVD risk score is only valid for ages 75 to 43   A/P: Diabetes longstanding currently uncontrolled, however, home CBG control seems to be improving. Patient is able to verbalize appropriate  hypoglycemia management plan. Medication adherence appears appropriate. -Continued current regimen.  -Extensively discussed pathophysiology of diabetes, recommended lifestyle interventions, dietary effects on blood sugar control -Counseled on s/sx of and management of hypoglycemia -Next A1C anticipated 01/2021.  Written patient instructions provided.  Total time in face to face counseling 30 minutes.   Follow up Pharmacist Clinic Visit in 3-4 weeks.   02/2021, PharmD, Butch Penny, CPP Clinical Pharmacist [facility] & [facility] [phone]

## 2020-11-26 ENCOUNTER — Other Ambulatory Visit: Payer: Self-pay

## 2020-11-26 ENCOUNTER — Ambulatory Visit: Payer: Self-pay | Attending: Family Medicine | Admitting: Pharmacist

## 2020-11-26 ENCOUNTER — Encounter: Payer: Self-pay | Admitting: Pharmacist

## 2020-11-26 DIAGNOSIS — Z794 Long term (current) use of insulin: Secondary | ICD-10-CM

## 2020-11-26 DIAGNOSIS — E1165 Type 2 diabetes mellitus with hyperglycemia: Secondary | ICD-10-CM

## 2020-11-26 LAB — GLUCOSE, POCT (MANUAL RESULT ENTRY): POC Glucose: 133 mg/dl — AB (ref 70–99)

## 2020-11-26 MED FILL — Insulin Lispro Soln Pen-injector 100 Unit/ML (1 Unit Dial): SUBCUTANEOUS | 84 days supply | Qty: 30 | Fill #0 | Status: AC

## 2020-11-26 MED FILL — Glucose Blood Test Strip: 25 days supply | Qty: 100 | Fill #0 | Status: AC

## 2020-11-26 NOTE — Progress Notes (Signed)
     S:    PCP: Dr. Alvis Lemmings   No chief complaint on file.  Patient arrives in good spirits. Presents for diabetes evaluation, education, and management. Patient was referred and last seen by Primary Care Provider on 10/29/2020. I saw the pt 11/12/2020 and made no changes.   Family/Social History:  -FHx: kidney failure, HTN, DM, CHF -Tobacco: current every day smoker  -Alcohol: occasional   Insurance coverage/medication affordability: self pay  Medication adherence reported.   Current diabetes medications include: dulaglutide 3 mg weekly,glipizide 10 mg XL (pt takes 20mg  daily), Lantus 45 units daily, Humalog 0-12 units TID per sliding scale (uses 2-4 units with most meals)  Patient denies hypoglycemic events.  Patient reported dietary habits:  - Has cut out all energy drinks  -Trying to adhere more to a diabetic diet   Patient-reported exercise habits:  -Mostly active at work    Patient denies nocturia (nighttime urination).  Patient denies neuropathy (nerve pain). Patient denies visual changes. Patient reports self foot exams.     O:  POCT: 133  Lab Results  Component Value Date   HGBA1C 12.1 (A) 10/29/2020   There were no vitals filed for this visit.  Lipid Panel     Component Value Date/Time   CHOL 167 08/01/2020 1033   TRIG 102 08/01/2020 1033   HDL 40 08/01/2020 1033   CHOLHDL 4.2 08/01/2020 1033   LDLCALC 108 (H) 08/01/2020 1033   Home fasting blood sugars: 80-low 100s 2 hour post-meal/random blood sugars: 100s. Most readings >200 since Trulicity change, however, there are some outliers which patient endorses is dependent on what he eats.   Clinical Atherosclerotic Cardiovascular Disease (ASCVD): No  The ASCVD Risk score 08/03/2020 DC Jr., et al., 2013) failed to calculate for the following reasons:   The 2013 ASCVD risk score is only valid for ages 82 to 70   A/P: Diabetes longstanding currently uncontrolled, however, home CBG control seems to be improving.  Patient is able to verbalize appropriate hypoglycemia management plan. Medication adherence appears appropriate. -Continued current regimen.  -No changes made to therapy. -Extensively discussed pathophysiology of diabetes, recommended lifestyle interventions, dietary effects on blood sugar control -Counseled on s/sx of and management of hypoglycemia -Next A1C anticipated 01/2021.  Written patient instructions provided. Total time in face to face counseling 30 minutes.   Follow up with PCP.   Patient seen with:  02/2021 PharmD/MBA Candidate [facility] [employer] 2023    2024, PharmD, [place], CPP Clinical Pharmacist [facility] & [facility] [phone]

## 2020-11-29 ENCOUNTER — Other Ambulatory Visit: Payer: Self-pay

## 2020-12-15 MED FILL — Glipizide Tab ER 24HR 10 MG: ORAL | 30 days supply | Qty: 60 | Fill #2 | Status: AC

## 2020-12-17 ENCOUNTER — Other Ambulatory Visit: Payer: Self-pay

## 2020-12-24 ENCOUNTER — Other Ambulatory Visit: Payer: Self-pay

## 2020-12-31 ENCOUNTER — Other Ambulatory Visit (HOSPITAL_COMMUNITY): Payer: Self-pay

## 2021-01-01 ENCOUNTER — Other Ambulatory Visit (HOSPITAL_COMMUNITY): Payer: Self-pay

## 2021-01-02 ENCOUNTER — Other Ambulatory Visit (HOSPITAL_COMMUNITY): Payer: Self-pay

## 2021-01-03 ENCOUNTER — Other Ambulatory Visit (HOSPITAL_COMMUNITY): Payer: Self-pay

## 2021-01-04 ENCOUNTER — Other Ambulatory Visit (HOSPITAL_COMMUNITY): Payer: Self-pay

## 2021-01-04 MED ORDER — TESTOSTERONE CYPIONATE 200 MG/ML IM SOLN
75.0000 mg | INTRAMUSCULAR | 0 refills | Status: DC
  Filled 2021-01-04: qty 4, 28d supply, fill #0

## 2021-01-28 ENCOUNTER — Ambulatory Visit: Payer: Self-pay | Attending: Family Medicine | Admitting: Family Medicine

## 2021-01-28 ENCOUNTER — Other Ambulatory Visit: Payer: Self-pay

## 2021-01-28 ENCOUNTER — Encounter: Payer: Self-pay | Admitting: Family Medicine

## 2021-01-28 VITALS — BP 129/85 | HR 97 | Ht 65.0 in | Wt 319.0 lb

## 2021-01-28 DIAGNOSIS — G8929 Other chronic pain: Secondary | ICD-10-CM

## 2021-01-28 DIAGNOSIS — E1165 Type 2 diabetes mellitus with hyperglycemia: Secondary | ICD-10-CM

## 2021-01-28 DIAGNOSIS — M25512 Pain in left shoulder: Secondary | ICD-10-CM

## 2021-01-28 DIAGNOSIS — Z794 Long term (current) use of insulin: Secondary | ICD-10-CM

## 2021-01-28 LAB — GLUCOSE, POCT (MANUAL RESULT ENTRY): POC Glucose: 187 mg/dl — AB (ref 70–99)

## 2021-01-28 LAB — POCT GLYCOSYLATED HEMOGLOBIN (HGB A1C): HbA1c, POC (controlled diabetic range): 9.8 % — AB (ref 0.0–7.0)

## 2021-01-28 MED ORDER — TRULICITY 4.5 MG/0.5ML ~~LOC~~ SOAJ
4.5000 mg | SUBCUTANEOUS | 6 refills | Status: DC
  Filled 2021-01-28: qty 2, 28d supply, fill #0
  Filled 2021-02-26 – 2021-02-27 (×2): qty 2, 28d supply, fill #1
  Filled 2021-03-29: qty 6, 84d supply, fill #2
  Filled 2021-06-04: qty 6, 84d supply, fill #3
  Filled 2021-06-11: qty 2, 28d supply, fill #3
  Filled 2021-06-12: qty 6, 84d supply, fill #3

## 2021-01-28 MED FILL — Glipizide Tab ER 24HR 10 MG: ORAL | 30 days supply | Qty: 60 | Fill #3 | Status: AC

## 2021-01-28 NOTE — Progress Notes (Signed)
Subjective:  Patient ID: Diana Joyce, adult    DOB: 29-Jun-1990  Age: 30 y.o. MRN: [id number]  CC: Diabetes   HPI Diana Joyce is a 30 y.o. year old adult with a history of is a 30 year old [facility]female [facility] female) with type 2 diabetes mellitus (A1c 11.8), asthma, morbid obesity here for follow-up visit He started testosterone 07/25/19.  Interval History: Work has been stressful and he is unable to do meal prep. The last month he has been doing a lot of fast food. Ran out of his medications - Trulicity and Glipizide 3 days ago but prior to that had been compliant.  A1c is 9.8 down from 11.8 previously.  His left shoulder has been hurting for the last 1 month and he has to use Tylenol but at other times uses an OTC cream his father gave him. 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 Past Medical History:  Diagnosis Date   Apnea, sleep    Asthma    Diabetes ([place])     Past Surgical History:  Procedure Laterality Date   FRACTURE SURGERY      Family History  Problem Relation Age of Onset   Kidney failure Mother    Hypertension Mother    Diabetes Mother    Heart failure Mother    Diabetes Father     Allergies  Allergen Reactions   Benadryl [Diphenhydramine Hcl] Other (See Comments)    Hives, coughing, itching.     Outpatient Medications Prior to Visit  Medication Sig Dispense Refill   albuterol (VENTOLIN HFA) 108 (90 Base) MCG/ACT inhaler Inhale 2 puffs into the lungs every 6 (six) hours as needed for wheezing or shortness of breath. 18 g 2   blood glucose meter kit and supplies KIT Dispense with test strips and lancets based on insurance preference. Use up to four times daily as directed. (FOR ICD-9 250.00, 250.01). 100 each 12   Blood Glucose Monitoring Suppl (TRUE METRIX METER) w/Device KIT Check blood sugars fasting and at bedtime daily 1 kit 1   glipiZIDE  (GLUCOTROL XL) 10 MG 24 hr tablet TAKE 2 TABLETS (20 MG TOTAL) BY MOUTH DAILY WITH BREAKFAST. 180 tablet 1   insulin glargine (LANTUS) 100 UNIT/ML injection Inject 0.45 mLs (45 Units total) into the skin daily. 190 mL 0   insulin lispro (HUMALOG) 100 UNIT/ML KwikPen INJECT 0-12 UNITS INTO THE SKIN 3 (THREE) TIMES DAILY. ACCORDING TO SLIDING SCALE 30 mL 6   Insulin Pen Needle (PEN NEEDLES) 32G X 4 MM MISC 1 Dose by Does not apply route daily. 100 each 1   testosterone cypionate (DEPOTESTOSTERONE CYPIONATE) 200 MG/ML injection Inject 0.375 mLs (75 mg total) into the skin once a week. 4 mL 0   TRUEplus Lancets 28G MISC Use as directed 3 (three) times daily before meals. 100 each 12   Dulaglutide (TRULICITY) 3 TG/6.2IR SOPN Inject 3 mg as directed once a week. 2 mL 6   aluminum chloride (DRYSOL) 20 % external solution Apply topically at bedtime. (Patient not taking: No sig reported) 35 mL 1   Norgestimate-Ethinyl Estradiol Triphasic (ORTHO TRI-CYCLEN LO) 0.18/0.215/0.25 MG-25 MCG tab Take 1 tablet by mouth daily. (Patient not taking: No sig reported) 1 Package 11   nystatin (MYCOSTATIN/NYSTOP) powder Apply 1 application topically 3 (three) times daily. (Patient not taking: No sig reported) 60 g 1   testosterone cypionate (DEPOTESTOSTERONE CYPIONATE) 200  MG/ML injection Inject 0.375 mLs (75 mg total) into the skin once a week. 6 mL 2   TRUEPLUS LANCETS 28G MISC Check blood sugars fasting and at bedtime 100 each 1   No facility-administered medications prior to visit.     ROS Review of Systems  Constitutional:  Negative for activity change, appetite change and fatigue.  HENT:  Negative for congestion, sinus pressure and sore throat.   Eyes:  Negative for visual disturbance.  Respiratory:  Negative for cough, chest tightness, shortness of breath and wheezing.   Cardiovascular:  Negative for chest pain and palpitations.  Gastrointestinal:  Negative for abdominal distention, abdominal pain and  constipation.  Endocrine: Negative for polydipsia.  Genitourinary:  Negative for dysuria and frequency.  Musculoskeletal:  Positive for arthralgias.  Skin:  Negative for rash.  Neurological:  Negative for tremors, light-headedness and numbness.  Hematological:  Does not bruise/bleed easily.  Psychiatric/Behavioral:  Negative for agitation and behavioral problems.    Objective:  BP 129/85   Pulse 97   Ht 5' 5"  (1.651 m)   Wt (!) 319 lb (144.7 kg)   SpO2 99%   BMI 53.08 kg/m   BP/Weight 01/28/2021 10/24/6158 12/09/7104  Systolic BP 269 485 462  Diastolic BP 85 80 97  Wt. (Lbs) 319 317.4 320  BMI 53.08 52.82 53.25      Physical Exam Constitutional:      Appearance: He is well-developed.  Cardiovascular:     Rate and Rhythm: Normal rate.     Heart sounds: Normal heart sounds. No murmur heard. Pulmonary:     Effort: Pulmonary effort is normal.     Breath sounds: Normal breath sounds. No wheezing or rales.  Chest:     Chest wall: No tenderness.  Abdominal:     General: Bowel sounds are normal. There is no distension.     Palpations: Abdomen is soft. There is no mass.     Tenderness: There is no abdominal tenderness.  Musculoskeletal:        General: Normal range of motion.     Right lower leg: No edema.     Left lower leg: No edema.     Comments: No tenderness on palpation of left shoulder. Full range of motion of left shoulder  Neurological:     Mental Status: He is alert and oriented to person, place, and time.  Psychiatric:        Mood and Affect: Mood normal.    CMP Latest Ref Rng & Units 08/01/2020 03/06/2020 10/12/2019  Glucose 65 - 99 mg/dL 228(H) 253(H) 82  BUN 6 - 20 mg/dL 7 6 8   Creatinine 0.57 - 1.00 mg/dL 0.65 0.62 0.62  Sodium 134 - 144 mmol/L 137 136 138  Potassium 3.5 - 5.2 mmol/L 4.5 4.4 4.0  Chloride 96 - 106 mmol/L 101 101 97  CO2 20 - 29 mmol/L 22 25 21   Calcium 8.7 - 10.2 mg/dL 9.0 8.9 9.2  Total Protein 6.0 - 8.5 g/dL 7.1 - 7.3  Total Bilirubin  0.0 - 1.2 mg/dL 0.2 - 0.3  Alkaline Phos 44 - 121 IU/L 125(H) - 101  AST 0 - 40 IU/L 14 - 19  ALT 0 - 32 IU/L 20 - 17    Lipid Panel     Component Value Date/Time   CHOL 167 08/01/2020 1033   TRIG 102 08/01/2020 1033   HDL 40 08/01/2020 1033   CHOLHDL 4.2 08/01/2020 1033   LDLCALC 108 (H) 08/01/2020 1033  CBC    Component Value Date/Time   WBC 10.6 (H) 07/21/2019 0507   RBC 4.79 07/21/2019 0507   HGB 11.3 (L) 07/21/2019 0507   HCT 36.4 07/21/2019 0507   PLT 415 (H) 07/21/2019 0507   MCV 76.0 (L) 07/21/2019 0507   MCH 23.6 (L) 07/21/2019 0507   MCHC 31.0 07/21/2019 0507   RDW 15.3 07/21/2019 0507   LYMPHSABS 2.7 06/07/2019 1920   MONOABS 0.6 06/07/2019 1920   EOSABS 0.1 06/07/2019 1920   BASOSABS 0.0 06/07/2019 1920    Lab Results  Component Value Date   HGBA1C 9.8 (A) 01/28/2021    Assessment & Plan:  1. Type 2 diabetes mellitus with hyperglycemia, with long-term current use of insulin (HCC) Uncontrolled with A1c of 9.8 which has improved fro 11.8 and goal is less than 7.0 Increase Trulicity dose Continue to work on lifestyle modification Counseled on Diabetic diet, my plate method, 272 minutes of moderate intensity exercise/week Blood sugar logs with fasting goals of 80-120 mg/dl, random of less than 180 and in the event of sugars less than 60 mg/dl or greater than 400 mg/dl encouraged to notify the clinic. Advised on the need for annual eye exams, annual foot exams, Pneumonia vaccine. - POCT glucose (manual entry) - POCT glycosylated hemoglobin (Hb A1C) - Dulaglutide (TRULICITY) 4.5 ZD/6.6YQ SOPN; Inject 4.5 mg as directed once a week.  Dispense: 2 mL; Refill: 6  2. Chronic left shoulder pain Likely inflammatory in nature She would like to know possible etiologies and is wanting an orthopedic referral Advised to continue with OTC regimen which has been beneficial I will start off with left shoulder x-ray and she will benefit from PT Given her wanting to be  referred I will place orthopedic referral once x-ray is retrieved - DG Shoulder Left; Future   Health Care Maintenance: Declines Pneumovax Meds ordered this encounter  Medications   Dulaglutide (TRULICITY) 4.5 IH/4.7QQ SOPN    Sig: Inject 4.5 mg as directed once a week.    Dispense:  2 mL    Refill:  6    Dose increase     Follow-up: Return in about 3 months (around 04/30/2021) for Medical conditions.       Charlott Rakes, MD, FAAFP. [facility] and [place], [place]   01/28/2021, 2:19 PM

## 2021-01-29 ENCOUNTER — Other Ambulatory Visit: Payer: Self-pay

## 2021-01-31 ENCOUNTER — Other Ambulatory Visit: Payer: Self-pay

## 2021-02-01 ENCOUNTER — Other Ambulatory Visit: Payer: Self-pay

## 2021-02-16 IMAGING — DX DG CHEST 1V PORT
1 series · 1 of 1 positions shown · non-contrast
Comparison: 03/08/2017

CLINICAL DATA: Cough, shortness of breath

EXAM:
PORTABLE CHEST 1 VIEW

[chest ap]
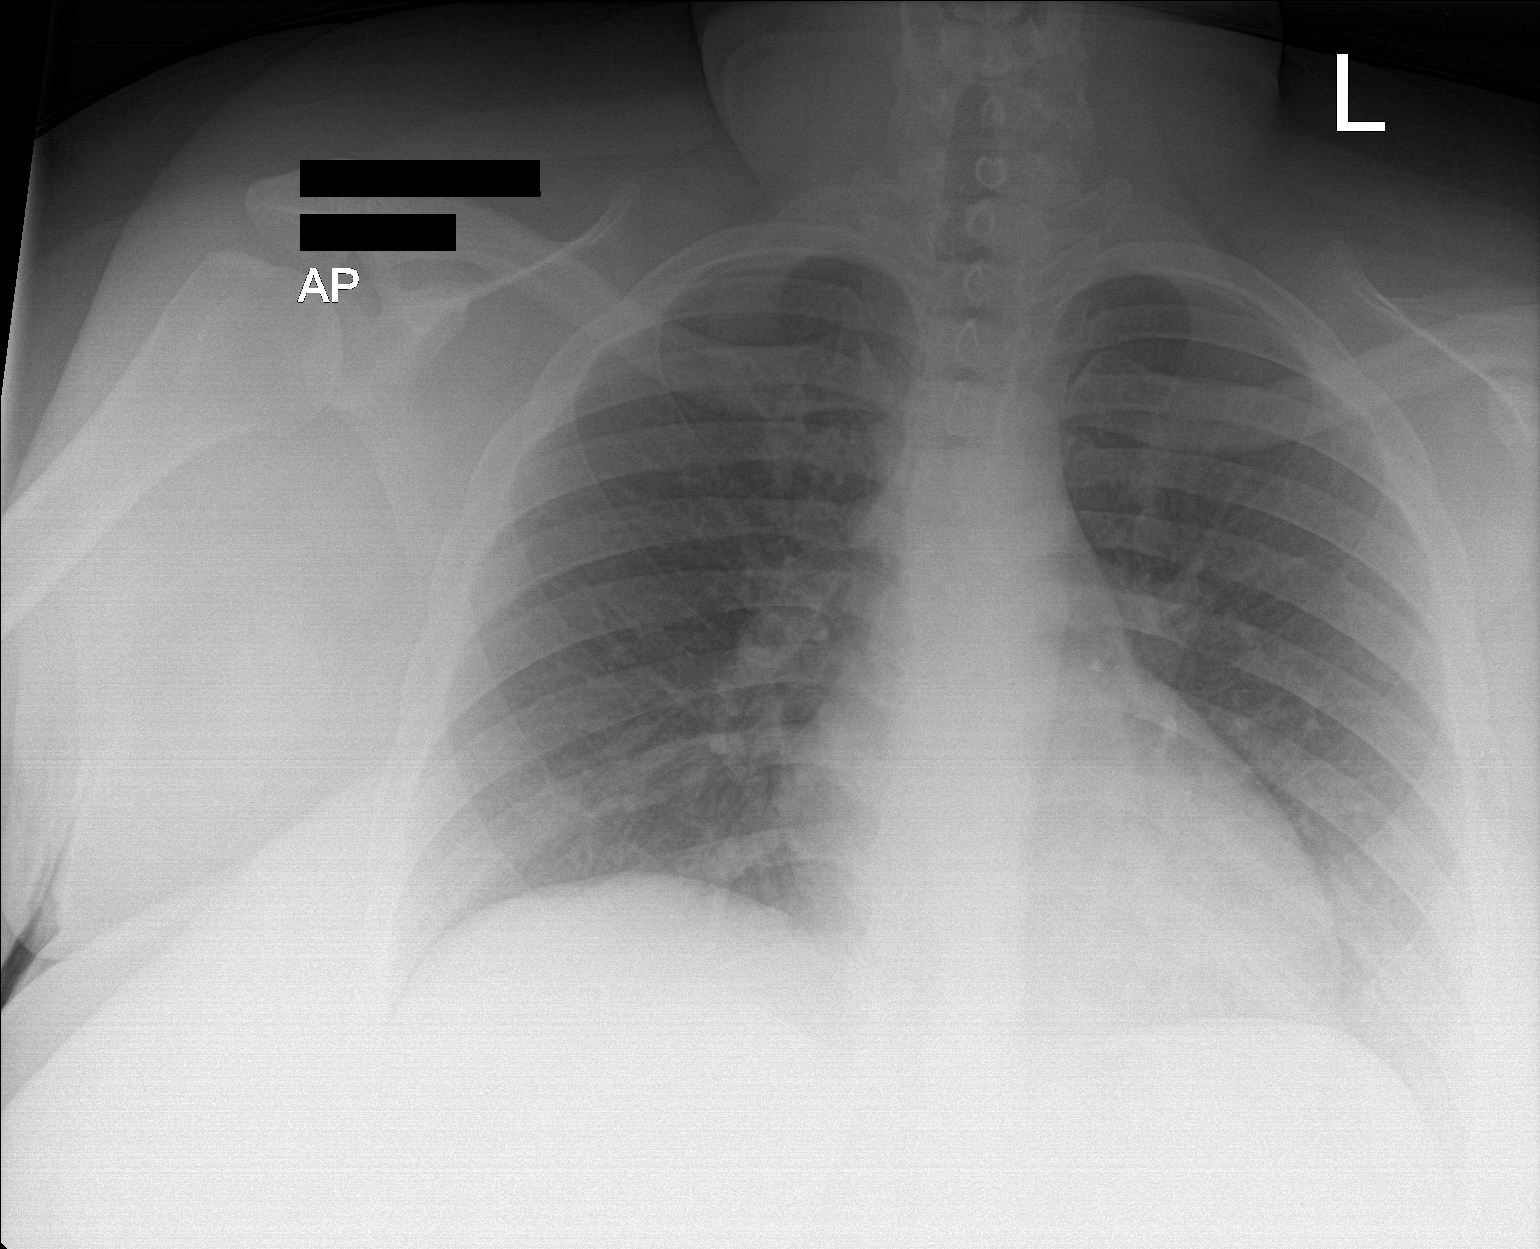

[1 of 1 positions shown; findings below may reference images not displayed]

FINDINGS: The heart size and mediastinal contours are within normal limits.
Both lungs are clear. The visualized skeletal structures are
unremarkable.
IMPRESSION: No active disease.

## 2021-02-26 MED FILL — Glipizide Tab ER 24HR 10 MG: ORAL | 30 days supply | Qty: 60 | Fill #4 | Status: AC

## 2021-02-27 ENCOUNTER — Other Ambulatory Visit: Payer: Self-pay

## 2021-02-28 ENCOUNTER — Other Ambulatory Visit: Payer: Self-pay

## 2021-03-01 ENCOUNTER — Telehealth: Payer: Self-pay | Admitting: Family Medicine

## 2021-03-01 ENCOUNTER — Other Ambulatory Visit: Payer: Self-pay

## 2021-03-01 NOTE — Telephone Encounter (Signed)
Form has been located.  

## 2021-03-01 NOTE — Telephone Encounter (Signed)
Patient dropped off FMLA forms to be filled out for work. Patient scheduled for Virtual on 9/28 at 1:30

## 2021-03-06 ENCOUNTER — Other Ambulatory Visit: Payer: Self-pay

## 2021-03-06 ENCOUNTER — Encounter: Payer: Self-pay | Admitting: Family Medicine

## 2021-03-06 ENCOUNTER — Ambulatory Visit: Payer: Self-pay | Attending: Family Medicine | Admitting: Family Medicine

## 2021-03-06 DIAGNOSIS — Z794 Long term (current) use of insulin: Secondary | ICD-10-CM

## 2021-03-06 DIAGNOSIS — E11649 Type 2 diabetes mellitus with hypoglycemia without coma: Secondary | ICD-10-CM

## 2021-03-06 DIAGNOSIS — Z029 Encounter for administrative examinations, unspecified: Secondary | ICD-10-CM

## 2021-03-06 NOTE — Progress Notes (Signed)
Virtual Visit via Telephone Note  I connected with Diana Joyce, on 03/06/2021 at 1:56 PM by telephone due to the COVID-19 pandemic and verified that I am speaking with the correct person using two identifiers.   Consent: I discussed the limitations, risks, security and privacy concerns of performing an evaluation and management service by telephone and the availability of in person appointments. I also discussed with the patient that there may be a patient responsible charge related to this service. The patient expressed understanding and agreed to proceed.   Location of Patient: Work  Biomedical scientist of Provider: Clinic   Persons participating in Telemedicine visit: Diana Joyce Dr. Margarita Rana     History of Present Illness: Diana Joyce is a 30 y.o. year old adult with a history of is a 30 year old [facility]) with type 2 diabetes mellitus (A1c 11.8), asthma, morbid obesity. Her visit today is for the purpose of completing her FMLA paperwork. When her Trulicity dose was increased at her last visit her sugar dropped to 60 and she had to leave work to go home and get food. She did feel thirsty and sweaty.  As a result she decreased her Lantus from 45 units to 40 units.  This occurred on 02/02/2021. Paperwork is required to cover her for episodes of hypoglycemia or hyperglycemia requiring her to leave work.  Since that episode she has not had a repeat.  Past Medical History:  Diagnosis Date   Apnea, sleep    Asthma    Diabetes (HCC)    Allergies  Allergen Reactions   Benadryl [Diphenhydramine Hcl] Other (See Comments)    Hives, coughing, itching.     Current Outpatient Medications on File Prior to Visit  Medication Sig Dispense Refill   albuterol (VENTOLIN HFA) 108 (90 Base) MCG/ACT inhaler Inhale 2 puffs into the lungs every 6 (six) hours as needed for wheezing or shortness of breath. 18 g 2    aluminum chloride (DRYSOL) 20 % external solution Apply topically at bedtime. (Patient not taking: No sig reported) 35 mL 1   blood glucose meter kit and supplies KIT Dispense with test strips and lancets based on insurance preference. Use up to four times daily as directed. (FOR ICD-9 250.00, 250.01). 100 each 12   Blood Glucose Monitoring Suppl (TRUE METRIX METER) w/Device KIT Check blood sugars fasting and at bedtime daily 1 kit 1   Dulaglutide (TRULICITY) 4.5 WI/2.0BT SOPN Inject 4.5 mg as directed once a week. 2 mL 6   glipiZIDE (GLUCOTROL XL) 10 MG 24 hr tablet TAKE 2 TABLETS (20 MG TOTAL) BY MOUTH DAILY WITH BREAKFAST. 180 tablet 1   insulin glargine (LANTUS) 100 UNIT/ML injection Inject 0.45 mLs (45 Units total) into the skin daily. 190 mL 0   insulin lispro (HUMALOG) 100 UNIT/ML KwikPen INJECT 0-12 UNITS INTO THE SKIN 3 (THREE) TIMES DAILY. ACCORDING TO SLIDING SCALE 30 mL 6   Insulin Pen Needle (PEN NEEDLES) 32G X 4 MM MISC 1 Dose by Does not apply route daily. 100 each 1   Norgestimate-Ethinyl Estradiol Triphasic (ORTHO TRI-CYCLEN LO) 0.18/0.215/0.25 MG-25 MCG tab Take 1 tablet by mouth daily. (Patient not taking: No sig reported) 1 Package 11   nystatin (MYCOSTATIN/NYSTOP) powder Apply 1 application topically 3 (three) times daily. (Patient not taking: No sig reported) 60 g 1   testosterone cypionate (DEPOTESTOSTERONE CYPIONATE) 200 MG/ML injection Inject 0.375 mLs (75 mg total) into the skin once a week.  4 mL 0   TRUEplus Lancets 28G MISC Use as directed 3 (three) times daily before meals. 100 each 12   No current facility-administered medications on file prior to visit.    ROS: See HPI  Observations/Objective: Awake, alert, oriented x3 Not in acute distress Normal mood  CMP Latest Ref Rng & Units 08/01/2020 03/06/2020 10/12/2019  Glucose 65 - 99 mg/dL 228(H) 253(H) 82  BUN 6 - 20 mg/dL _0 Creatinine 0.57 - 1.00 mg/dL 0.65 0.62 0.62  Sodium 134 - 144 mmol/L 137 136 138   Potassium 3.5 - 5.2 mmol/L 4.5 4.4 4.0  Chloride 96 - 106 mmol/L 101 101 97  CO2 20 - 29 mmol/L _1 Calcium 8.7 - 10.2 mg/dL 9.0 8.9 9.2  Total Protein 6.0 - 8.5 g/dL 7.1 - 7.3  Total Bilirubin 0.0 - 1.2 mg/dL 0.2 - 0.3  Alkaline Phos 44 - 121 IU/L 125(H) - 101  AST 0 - 40 IU/L 14 - 19  ALT 0 - 32 IU/L 20 - 17    Lipid Panel     Component Value Date/Time   CHOL 167 08/01/2020 1033   TRIG 102 08/01/2020 1033   HDL 40 08/01/2020 1033   CHOLHDL 4.2 08/01/2020 1033   LDLCALC 108 (H) 08/01/2020 1033   LABVLDL 19 08/01/2020 1033    Lab Results  Component Value Date   HGBA1C 9.8 (A) 01/28/2021    Assessment and Plan: 1. Type 2 diabetes mellitus with hypoglycemia without coma, with long-term current use of insulin (HCC) A1c of 9.8; goal is less than 7.0 Due to hypoglycemia advised to decrease Lantus dose.  She had previously decreased from 45 units to 40 units but I have advised her to take 42 units instead Continue current dose of Trulicity Advised to reach out to the clinic if hypoglycemia occurs again Counseled on Diabetic diet, my plate method, 615 minutes of moderate intensity exercise/week Blood sugar logs with fasting goals of 80-120 mg/dl, random of less than 180 and in the event of sugars less than 60 mg/dl or greater than 400 mg/dl encouraged to notify the clinic. Advised on the need for annual eye exams, annual foot exams, Pneumonia vaccine.  2. Encounters for administrative purposes FMLA paperwork completed   Follow Up Instructions: Keep previously scheduled appointment   I discussed the assessment and treatment plan with the patient. The patient was provided an opportunity to ask questions and all were answered. The patient agreed with the plan and demonstrated an understanding of the instructions.   The patient was advised to call back or seek an in-person evaluation if the symptoms worsen or if the condition fails to improve as anticipated.     I  provided 11 minutes total of non-face-to-face time during this encounter.   Charlott Rakes, MD, FAAFP. [facility] and [place], [place]   03/06/2021, 1:56 PM

## 2021-03-15 ENCOUNTER — Other Ambulatory Visit (HOSPITAL_COMMUNITY): Payer: Self-pay

## 2021-03-15 MED ORDER — TESTOSTERONE CYPIONATE 200 MG/ML IM SOLN
75.0000 mg | INTRAMUSCULAR | 0 refills | Status: DC
  Filled 2021-03-15: qty 2, 14d supply, fill #0

## 2021-03-15 MED ORDER — "NEEDLE (DISP) 18G X 1"" MISC"
0 refills | Status: DC
  Filled 2021-03-15: qty 20, 20d supply, fill #0

## 2021-03-15 MED ORDER — "NEEDLE (DISP) 25G X 5/8"" MISC"
0 refills | Status: DC
  Filled 2021-03-15: qty 20, 20d supply, fill #0

## 2021-03-15 MED ORDER — BD SYRINGE LUER-LOK 1 ML MISC: 0 refills | Status: DC

## 2021-03-29 ENCOUNTER — Other Ambulatory Visit: Payer: Self-pay | Admitting: Family Medicine

## 2021-03-29 ENCOUNTER — Other Ambulatory Visit: Payer: Self-pay

## 2021-03-29 ENCOUNTER — Other Ambulatory Visit: Payer: Self-pay | Admitting: Pharmacist

## 2021-03-29 DIAGNOSIS — Z794 Long term (current) use of insulin: Secondary | ICD-10-CM

## 2021-03-29 DIAGNOSIS — E1165 Type 2 diabetes mellitus with hyperglycemia: Secondary | ICD-10-CM

## 2021-03-29 MED ORDER — TRUE METRIX BLOOD GLUCOSE TEST VI STRP
ORAL_STRIP | 12 refills | Status: DC
  Filled 2021-03-29: qty 100, 30d supply, fill #0
  Filled 2021-06-11: qty 100, 30d supply, fill #1
  Filled 2021-06-12: qty 100, 30d supply, fill #0
  Filled 2021-06-12: qty 100, 30d supply, fill #1
  Filled 2021-06-14 – 2021-08-02 (×3): qty 100, 30d supply, fill #0
  Filled 2021-11-08: qty 100, 30d supply, fill #1
  Filled 2022-03-20: qty 100, 30d supply, fill #2

## 2021-03-29 MED ORDER — GLIPIZIDE ER 10 MG PO TB24
ORAL_TABLET | ORAL | 0 refills | Status: DC
  Filled 2021-03-29: qty 60, 30d supply, fill #0

## 2021-03-29 NOTE — Telephone Encounter (Signed)
Requested Prescriptions  Pending Prescriptions Disp Refills  . glipiZIDE (GLIPIZIDE XL) 10 MG 24 hr tablet 180 tablet 0    Sig: TAKE 2 TABLETS (20 MG TOTAL) BY MOUTH DAILY WITH BREAKFAST.     Endocrinology:  Diabetes - Sulfonylureas Failed - 03/29/2021  9:59 AM      Failed - HBA1C is between 0 and 7.9 and within 180 days    HbA1c, POC (controlled diabetic range)  Date Value Ref Range Status  01/28/2021 9.8 (A) 0.0 - 7.0 % Final         Passed - Valid encounter within last 6 months    Recent Outpatient Visits          3 weeks ago Type 2 diabetes mellitus with hypoglycemia without coma, with long-term current use of insulin (HCC)   [facility] [place], [place], MD   2 months ago Type 2 diabetes mellitus with hyperglycemia, with long-term current use of insulin (HCC)   [facility] [place], [place], MD   4 months ago Type 2 diabetes mellitus with hyperglycemia, with long-term current use of insulin [facility])   [facility] [place], Jeannett Senior L, RPH-CPP   4 months ago Type 2 diabetes mellitus with hyperglycemia, with long-term current use of insulin [facility])   [facility] [place], Jeannett Senior L, RPH-CPP   5 months ago Type 2 diabetes mellitus with hyperglycemia, with long-term current use of insulin (HCC)   [facility] Hoy Register, MD      Future Appointments            In 1 month Hoy Register, MD [facility]

## 2021-04-01 ENCOUNTER — Other Ambulatory Visit: Payer: Self-pay

## 2021-05-08 ENCOUNTER — Other Ambulatory Visit: Payer: Self-pay

## 2021-05-08 ENCOUNTER — Encounter: Payer: Self-pay | Admitting: Family Medicine

## 2021-05-08 ENCOUNTER — Ambulatory Visit: Payer: Self-pay | Attending: Family Medicine | Admitting: Family Medicine

## 2021-05-08 VITALS — BP 122/87 | HR 107 | Ht 65.0 in | Wt 319.2 lb

## 2021-05-08 DIAGNOSIS — Z794 Long term (current) use of insulin: Secondary | ICD-10-CM

## 2021-05-08 DIAGNOSIS — E1165 Type 2 diabetes mellitus with hyperglycemia: Secondary | ICD-10-CM

## 2021-05-08 DIAGNOSIS — Z23 Encounter for immunization: Secondary | ICD-10-CM

## 2021-05-08 LAB — POCT GLYCOSYLATED HEMOGLOBIN (HGB A1C): HbA1c, POC (controlled diabetic range): 10.5 % — AB (ref 0.0–7.0)

## 2021-05-08 LAB — GLUCOSE, POCT (MANUAL RESULT ENTRY): POC Glucose: 179 mg/dl — AB (ref 70–99)

## 2021-05-08 MED ORDER — DAPAGLIFLOZIN PROPANEDIOL 10 MG PO TABS
10.0000 mg | ORAL_TABLET | Freq: Every day | ORAL | 1 refills | Status: DC
  Filled 2021-05-08: qty 30, 30d supply, fill #0
  Filled 2021-06-04 – 2021-06-12 (×3): qty 30, 30d supply, fill #1

## 2021-05-08 MED ORDER — INSULIN GLARGINE 100 UNIT/ML ~~LOC~~ SOLN
50.0000 [IU] | Freq: Every day | SUBCUTANEOUS | 3 refills | Status: DC
  Filled 2021-05-08: qty 10, 20d supply, fill #0
  Filled 2021-06-04 – 2021-06-12 (×3): qty 10, 20d supply, fill #1

## 2021-05-08 MED ORDER — GLIPIZIDE ER 10 MG PO TB24
ORAL_TABLET | ORAL | 1 refills | Status: DC
  Filled 2021-05-08: qty 60, 30d supply, fill #0
  Filled 2021-06-04 – 2021-06-12 (×3): qty 60, 30d supply, fill #1

## 2021-05-08 NOTE — Progress Notes (Signed)
Subjective:  Patient ID: Diana Joyce, adult    DOB: 1990/09/27  Age: 30 y.o. MRN: [id number]  CC: Diabetes   HPI Diana Joyce is a 30 y.o. year old adult with a history of type 2 diabetes mellitus (A1c 11.8), asthma, morbid obesity, on [facility] from [facility] for [facility] (female to female)  Interval History: Work has been busy and he is not able to wake up to cook and so has not been as consistent with trying to eat healthy.  He informs me he is tired of eating fast food due to his busy schedule. Endorses compliance with glipizide, Trulicity and Lantus 45 units but does not take Humalog as it causes his sugar to drop too low.  Sometimes uses Humalog about 2-3 times a week. He denies blurry vision or numbness in extremities. Denies additional concerns.  Past Medical History:  Diagnosis Date   Apnea, sleep    Asthma    Diabetes ([place])     Past Surgical History:  Procedure Laterality Date   FRACTURE SURGERY      Family History  Problem Relation Age of Onset   Kidney failure Mother    Hypertension Mother    Diabetes Mother    Heart failure Mother    Diabetes Father     Allergies  Allergen Reactions   Benadryl [Diphenhydramine Hcl] Other (See Comments)    Hives, coughing, itching.     Outpatient Medications Prior to Visit  Medication Sig Dispense Refill   albuterol (VENTOLIN HFA) 108 (90 Base) MCG/ACT inhaler Inhale 2 puffs into the lungs every 6 (six) hours as needed for wheezing or shortness of breath. 18 g 2   aluminum chloride (DRYSOL) 20 % external solution Apply topically at bedtime. (Patient not taking: Reported on 08/01/2020) 35 mL 1   blood glucose meter kit and supplies KIT Dispense with test strips and lancets based on insurance preference. Use up to four times daily as directed. (FOR ICD-9 250.00, 250.01). 100 each 12   Blood Glucose Monitoring Suppl (TRUE METRIX METER) w/Device KIT Check blood sugars fasting and at bedtime daily 1  kit 1   Dulaglutide (TRULICITY) 4.5 CM/0.3KJ SOPN Inject 4.5 mg as directed once a week. 2 mL 6   glucose blood (TRUE METRIX BLOOD GLUCOSE TEST) test strip Use as instructed 100 each 12   insulin lispro (HUMALOG) 100 UNIT/ML KwikPen INJECT 0-12 UNITS INTO THE SKIN 3 (THREE) TIMES DAILY. ACCORDING TO SLIDING SCALE 30 mL 6   Insulin Pen Needle (PEN NEEDLES) 32G X 4 MM MISC 1 Dose by Does not apply route daily. 100 each 1   NEEDLE, DISP, 18 G 18G X 1" MISC Use as directed 20 each 0   NEEDLE, DISP, 25 G 25G X 5/8" MISC Use as directed 20 each 0   Norgestimate-Ethinyl Estradiol Triphasic (ORTHO TRI-CYCLEN LO) 0.18/0.215/0.25 MG-25 MCG tab Take 1 tablet by mouth daily. (Patient not taking: Reported on 10/12/2018) 1 Package 11   nystatin (MYCOSTATIN/NYSTOP) powder Apply 1 application topically 3 (three) times daily. (Patient not taking: Reported on 10/29/2020) 60 g 1   Syringe, Disposable, (B-D SYRINGE LUER-LOK 1CC) 1 ML MISC use as directed 20 each 0   testosterone cypionate (DEPOTESTOSTERONE CYPIONATE) 200 MG/ML injection Inject 0.375 mLs (75 mg total) into the skin once a week. 2 mL 0   TRUEplus Lancets 28G MISC Use as directed 3 (three) times daily before meals. 100 each 12   glipiZIDE (GLIPIZIDE XL) 10 MG 24 hr tablet  TAKE 2 TABLETS (20 MG TOTAL) BY MOUTH DAILY WITH BREAKFAST. 180 tablet 0   insulin glargine (LANTUS) 100 UNIT/ML injection Inject 0.45 mLs (45 Units total) into the skin daily. 190 mL 0   No facility-administered medications prior to visit.     ROS Review of Systems  Constitutional:  Negative for activity change, appetite change and fatigue.  HENT:  Negative for congestion, sinus pressure and sore throat.   Eyes:  Negative for visual disturbance.  Respiratory:  Negative for cough, chest tightness, shortness of breath and wheezing.   Cardiovascular:  Negative for chest pain and palpitations.  Gastrointestinal:  Negative for abdominal distention, abdominal pain and constipation.   Endocrine: Negative for polydipsia.  Genitourinary:  Negative for dysuria and frequency.  Musculoskeletal:  Negative for arthralgias and back pain.  Skin:  Negative for rash.  Neurological:  Negative for tremors, light-headedness and numbness.  Hematological:  Does not bruise/bleed easily.  Psychiatric/Behavioral:  Negative for agitation and behavioral problems.    Objective:  BP 122/87   Pulse (!) 107   Ht 5' 5"  (1.651 m)   Wt (!) 319 lb 3.2 oz (144.8 kg)   SpO2 97%   BMI 53.12 kg/m   BP/Weight 05/08/2021 01/28/2021 1/91/4782  Systolic BP 956 213 086  Diastolic BP 87 85 80  Wt. (Lbs) 319.2 319 317.4  BMI 53.12 53.08 52.82      Physical Exam Constitutional:      Appearance: He is well-developed.  Cardiovascular:     Rate and Rhythm: Tachycardia present.     Heart sounds: Normal heart sounds. No murmur heard. Pulmonary:     Effort: Pulmonary effort is normal.     Breath sounds: Normal breath sounds. No wheezing or rales.  Chest:     Chest wall: No tenderness.  Abdominal:     General: Bowel sounds are normal. There is no distension.     Palpations: Abdomen is soft. There is no mass.     Tenderness: There is no abdominal tenderness.  Musculoskeletal:        General: Normal range of motion.     Right lower leg: No edema.     Left lower leg: No edema.  Neurological:     Mental Status: He is alert and oriented to person, place, and time.  Psychiatric:        Mood and Affect: Mood normal.    CMP Latest Ref Rng & Units 08/01/2020 03/06/2020 10/12/2019  Glucose 65 - 99 mg/dL 228(H) 253(H) 82  BUN 6 - 20 mg/dL 7 6 8   Creatinine 0.57 - 1.00 mg/dL 0.65 0.62 0.62  Sodium 134 - 144 mmol/L 137 136 138  Potassium 3.5 - 5.2 mmol/L 4.5 4.4 4.0  Chloride 96 - 106 mmol/L 101 101 97  CO2 20 - 29 mmol/L 22 25 21   Calcium 8.7 - 10.2 mg/dL 9.0 8.9 9.2  Total Protein 6.0 - 8.5 g/dL 7.1 - 7.3  Total Bilirubin 0.0 - 1.2 mg/dL 0.2 - 0.3  Alkaline Phos 44 - 121 IU/L 125(H) - 101  AST 0  - 40 IU/L 14 - 19  ALT 0 - 32 IU/L 20 - 17    Lipid Panel     Component Value Date/Time   CHOL 167 08/01/2020 1033   TRIG 102 08/01/2020 1033   HDL 40 08/01/2020 1033   CHOLHDL 4.2 08/01/2020 1033   LDLCALC 108 (H) 08/01/2020 1033    CBC    Component Value Date/Time   WBC 10.6 (  H) 07/21/2019 0507   RBC 4.79 07/21/2019 0507   HGB 11.3 (L) 07/21/2019 0507   HCT 36.4 07/21/2019 0507   PLT 415 (H) 07/21/2019 0507   MCV 76.0 (L) 07/21/2019 0507   MCH 23.6 (L) 07/21/2019 0507   MCHC 31.0 07/21/2019 0507   RDW 15.3 07/21/2019 0507   LYMPHSABS 2.7 06/07/2019 1920   MONOABS 0.6 06/07/2019 1920   EOSABS 0.1 06/07/2019 1920   BASOSABS 0.0 06/07/2019 1920    Lab Results  Component Value Date   HGBA1C 10.5 (A) 05/08/2021    Assessment & Plan:  1. Type 2 diabetes mellitus with hyperglycemia, with long-term current use of insulin (HCC) Uncontrolled with A1c of 10.5; goal is less than 7.0 Increase Lantus from 45 to 50 units nightly Farxiga added to regimen Continue glipizide, Trulicity Tight work schedule predisposes her to nonadherence with a diabetic diet which is imperative and he has been encouraged to continue to work on this. - POCT glucose (manual entry) - POCT glycosylated hemoglobin (Hb A1C) - dapagliflozin propanediol (FARXIGA) 10 MG TABS tablet; Take 1 tablet (10 mg total) by mouth daily before breakfast.  Dispense: 90 tablet; Refill: 1 - glipiZIDE (GLIPIZIDE XL) 10 MG 24 hr tablet; TAKE 2 TABLETS (20 MG TOTAL) BY MOUTH DAILY WITH BREAKFAST.  Dispense: 180 tablet; Refill: 1 - insulin glargine (LANTUS) 100 UNIT/ML injection; Inject 0.5 mLs (50 Units total) into the skin daily.  Dispense: 190 mL; Refill: 3 - Basic Metabolic Panel  2. Morbid obesity ([place]) Counseled on increasing physical activity which include walking, exercising up to 150 minutes of moderate intensity exercise per week, reducing portion sizes Tight work schedule is a major challenge but she has been  encouraged to work on this.    Meds ordered this encounter  Medications   dapagliflozin propanediol (FARXIGA) 10 MG TABS tablet    Sig: Take 1 tablet (10 mg total) by mouth daily before breakfast.    Dispense:  90 tablet    Refill:  1   glipiZIDE (GLIPIZIDE XL) 10 MG 24 hr tablet    Sig: TAKE 2 TABLETS (20 MG TOTAL) BY MOUTH DAILY WITH BREAKFAST.    Dispense:  180 tablet    Refill:  1   insulin glargine (LANTUS) 100 UNIT/ML injection    Sig: Inject 0.5 mLs (50 Units total) into the skin daily.    Dispense:  190 mL    Refill:  3     Follow-up: Return in about 3 months (around 08/06/2021) for Chronic medical conditions.       Charlott Rakes, MD, FAAFP. [facility] and [place], [place]   05/08/2021, 9:19 AM

## 2021-05-08 NOTE — Patient Instructions (Signed)

## 2021-05-09 LAB — BASIC METABOLIC PANEL
BUN/Creatinine Ratio: 9 (ref 9–23)
BUN: 7 mg/dL (ref 6–20)
CO2: 23 mmol/L (ref 20–29)
Calcium: 8.9 mg/dL (ref 8.7–10.2)
Chloride: 99 mmol/L (ref 96–106)
Creatinine, Ser: 0.76 mg/dL (ref 0.57–1.00)
Glucose: 179 mg/dL — ABNORMAL HIGH (ref 70–99)
Potassium: 4.5 mmol/L (ref 3.5–5.2)
Sodium: 137 mmol/L (ref 134–144)
eGFR: 109 mL/min/{1.73_m2} (ref >59)

## 2021-05-21 ENCOUNTER — Other Ambulatory Visit (HOSPITAL_COMMUNITY): Payer: Self-pay

## 2021-05-21 MED ORDER — "BD DISP NEEDLES 25G X 5/8"" MISC"
0 refills | Status: DC
  Filled 2021-05-21: qty 4, 28d supply, fill #0

## 2021-05-21 MED ORDER — BD PLASTIPAK SYRINGE 3 ML MISC
0 refills | Status: DC
  Filled 2021-05-21: qty 4, 28d supply, fill #0

## 2021-05-21 MED ORDER — "NEEDLE (DISP) 18G X 1"" MISC"
0 refills | Status: DC
  Filled 2021-05-21: qty 4, 28d supply, fill #0

## 2021-05-21 MED ORDER — TESTOSTERONE CYPIONATE 200 MG/ML IM SOLN
80.0000 mg | INTRAMUSCULAR | 0 refills | Status: DC
  Filled 2021-05-21: qty 4, 28d supply, fill #0
  Filled 2021-07-21 – 2021-07-30 (×2): qty 2, 14d supply, fill #1

## 2021-05-24 ENCOUNTER — Other Ambulatory Visit (HOSPITAL_COMMUNITY): Payer: Self-pay

## 2021-06-05 ENCOUNTER — Other Ambulatory Visit: Payer: Self-pay

## 2021-06-07 ENCOUNTER — Other Ambulatory Visit: Payer: Self-pay

## 2021-06-11 ENCOUNTER — Other Ambulatory Visit: Payer: Self-pay

## 2021-06-12 ENCOUNTER — Other Ambulatory Visit: Payer: Self-pay

## 2021-06-12 ENCOUNTER — Encounter: Payer: Self-pay | Admitting: Family Medicine

## 2021-06-12 ENCOUNTER — Other Ambulatory Visit: Payer: Self-pay | Admitting: Family Medicine

## 2021-06-12 DIAGNOSIS — Z794 Long term (current) use of insulin: Secondary | ICD-10-CM

## 2021-06-12 DIAGNOSIS — J45909 Unspecified asthma, uncomplicated: Secondary | ICD-10-CM

## 2021-06-12 DIAGNOSIS — E1165 Type 2 diabetes mellitus with hyperglycemia: Secondary | ICD-10-CM

## 2021-06-12 MED ORDER — GLIPIZIDE ER 10 MG PO TB24
20.0000 mg | ORAL_TABLET | Freq: Every day | ORAL | 1 refills | Status: DC
Start: 2021-06-12 — End: 2021-11-12
  Filled 2021-06-12: qty 60, 30d supply, fill #0
  Filled 2021-07-16: qty 60, 30d supply, fill #1
  Filled 2021-08-12: qty 60, 30d supply, fill #2
  Filled 2021-09-18: qty 60, 30d supply, fill #3
  Filled 2021-10-17: qty 60, 30d supply, fill #4

## 2021-06-12 MED ORDER — ALBUTEROL SULFATE HFA 108 (90 BASE) MCG/ACT IN AERS
2.0000 | INHALATION_SPRAY | Freq: Four times a day (QID) | RESPIRATORY_TRACT | 2 refills | Status: DC | PRN
  Filled 2021-06-12: qty 18, 25d supply, fill #0

## 2021-06-12 MED ORDER — INSULIN LISPRO (1 UNIT DIAL) 100 UNIT/ML (KWIKPEN)
PEN_INJECTOR | SUBCUTANEOUS | 6 refills | Status: AC
Start: 2021-06-12 — End: ?
  Filled 2021-06-12: qty 9, 25d supply, fill #0

## 2021-06-12 MED ORDER — DAPAGLIFLOZIN PROPANEDIOL 10 MG PO TABS
10.0000 mg | ORAL_TABLET | Freq: Every day | ORAL | 1 refills | Status: DC
  Filled 2021-06-12: qty 30, 30d supply, fill #0
  Filled 2021-07-16: qty 30, 30d supply, fill #1
  Filled 2021-08-12: qty 30, 30d supply, fill #2
  Filled 2021-09-18: qty 30, 30d supply, fill #3
  Filled 2021-10-17: qty 30, 30d supply, fill #4

## 2021-06-12 MED ORDER — INSULIN GLARGINE 100 UNIT/ML ~~LOC~~ SOLN
50.0000 [IU] | Freq: Every day | SUBCUTANEOUS | 3 refills | Status: DC
  Filled 2021-06-12: qty 10, 20d supply, fill #0
  Filled 2021-07-02 – 2021-07-16 (×3): qty 10, 20d supply, fill #1
  Filled 2021-08-02: qty 10, 20d supply, fill #2
  Filled 2021-08-23: qty 10, 20d supply, fill #3
  Filled 2021-09-12: qty 10, 20d supply, fill #4
  Filled 2021-10-11: qty 10, 20d supply, fill #5
  Filled 2021-10-31: qty 10, 20d supply, fill #6
  Filled 2021-11-22: qty 10, 20d supply, fill #7
  Filled 2021-12-11: qty 10, 20d supply, fill #8
  Filled 2021-12-31: qty 10, 20d supply, fill #9
  Filled 2022-01-20: qty 10, 20d supply, fill #10
  Filled 2022-02-14: qty 10, 20d supply, fill #11
  Filled 2022-03-05: qty 10, 20d supply, fill #12
  Filled 2022-03-26 – 2022-04-03 (×2): qty 10, 20d supply, fill #13
  Filled 2022-04-24: qty 10, 20d supply, fill #14
  Filled 2022-05-15: qty 10, 20d supply, fill #15
  Filled 2022-06-03 (×2): qty 10, 20d supply, fill #16

## 2021-06-12 MED ORDER — TRULICITY 4.5 MG/0.5ML ~~LOC~~ SOAJ
4.5000 mg | SUBCUTANEOUS | 6 refills | Status: DC
Start: 2021-06-12 — End: 2021-06-27
  Filled 2021-06-12 – 2021-06-24 (×3): qty 2, 28d supply, fill #0

## 2021-06-13 ENCOUNTER — Other Ambulatory Visit (HOSPITAL_COMMUNITY): Payer: Self-pay

## 2021-06-13 ENCOUNTER — Other Ambulatory Visit: Payer: Self-pay

## 2021-06-15 ENCOUNTER — Other Ambulatory Visit: Payer: Self-pay

## 2021-06-17 ENCOUNTER — Other Ambulatory Visit: Payer: Self-pay

## 2021-06-21 ENCOUNTER — Encounter (HOSPITAL_BASED_OUTPATIENT_CLINIC_OR_DEPARTMENT_OTHER): Payer: Self-pay

## 2021-06-21 ENCOUNTER — Emergency Department (HOSPITAL_BASED_OUTPATIENT_CLINIC_OR_DEPARTMENT_OTHER): Payer: 59

## 2021-06-21 ENCOUNTER — Other Ambulatory Visit: Payer: Self-pay

## 2021-06-21 ENCOUNTER — Emergency Department (HOSPITAL_BASED_OUTPATIENT_CLINIC_OR_DEPARTMENT_OTHER)
Admission: EM | Admit: 2021-06-21 | Discharge: 2021-06-21 | Disposition: A | Discharge status: Home / Self Care | Payer: 59 | Attending: Emergency Medicine | Admitting: Emergency Medicine

## 2021-06-21 DIAGNOSIS — Z20822 Contact with and (suspected) exposure to covid-19: Secondary | ICD-10-CM | POA: Insufficient documentation

## 2021-06-21 DIAGNOSIS — R0602 Shortness of breath: Secondary | ICD-10-CM | POA: Diagnosis present

## 2021-06-21 DIAGNOSIS — J40 Bronchitis, not specified as acute or chronic: Secondary | ICD-10-CM | POA: Diagnosis not present

## 2021-06-21 LAB — RESP PANEL BY RT-PCR (FLU A&B, COVID) ARPGX2
Influenza A by PCR: NEGATIVE
Influenza B by PCR: NEGATIVE
SARS Coronavirus 2 by RT PCR: NEGATIVE

## 2021-06-21 MED ORDER — PREDNISONE 20 MG PO TABS
40.0000 mg | ORAL_TABLET | Freq: Once | ORAL | Status: AC
  Administered 2021-06-21: 40 mg via ORAL
  Filled 2021-06-21: qty 2

## 2021-06-21 MED ORDER — IPRATROPIUM-ALBUTEROL 0.5-2.5 (3) MG/3ML IN SOLN
3.0000 mL | Freq: Once | RESPIRATORY_TRACT | Status: AC
  Administered 2021-06-21: 3 mL via RESPIRATORY_TRACT

## 2021-06-21 MED ORDER — ALBUTEROL SULFATE (2.5 MG/3ML) 0.083% IN NEBU
INHALATION_SOLUTION | RESPIRATORY_TRACT | Status: AC
  Filled 2021-06-21: qty 3

## 2021-06-21 MED ORDER — ALBUTEROL (5 MG/ML) CONTINUOUS INHALATION SOLN
INHALATION_SOLUTION | RESPIRATORY_TRACT | Status: AC
  Filled 2021-06-21: qty 0.5

## 2021-06-21 MED ORDER — IPRATROPIUM-ALBUTEROL 0.5-2.5 (3) MG/3ML IN SOLN
RESPIRATORY_TRACT | Status: AC
  Filled 2021-06-21: qty 3

## 2021-06-21 MED ORDER — ALBUTEROL SULFATE HFA 108 (90 BASE) MCG/ACT IN AERS
1.0000 | INHALATION_SPRAY | Freq: Four times a day (QID) | RESPIRATORY_TRACT | Status: DC | PRN
  Administered 2021-06-21: 1 via RESPIRATORY_TRACT
  Filled 2021-06-21: qty 6.7

## 2021-06-21 MED ORDER — PREDNISONE 20 MG PO TABS: 40.0000 mg | ORAL_TABLET | Freq: Every day | ORAL | 0 refills | Status: AC

## 2021-06-21 MED ORDER — ALBUTEROL SULFATE (2.5 MG/3ML) 0.083% IN NEBU
2.5000 mg | INHALATION_SOLUTION | Freq: Once | RESPIRATORY_TRACT | Status: AC
  Administered 2021-06-21: 2.5 mg via RESPIRATORY_TRACT

## 2021-06-21 MED ORDER — PREDNISONE 20 MG PO TABS
40.0000 mg | ORAL_TABLET | Freq: Every day | ORAL | 0 refills | Status: DC
  Filled 2021-06-21: qty 10, 5d supply, fill #0

## 2021-06-21 MED ORDER — ALBUTEROL SULFATE (2.5 MG/3ML) 0.083% IN NEBU
INHALATION_SOLUTION | RESPIRATORY_TRACT | Status: AC
  Filled 2021-06-21: qty 6

## 2021-06-21 MED ORDER — ALBUTEROL SULFATE (2.5 MG/3ML) 0.083% IN NEBU
5.0000 mg | INHALATION_SOLUTION | Freq: Once | RESPIRATORY_TRACT | Status: AC
  Administered 2021-06-21: 5 mg via RESPIRATORY_TRACT

## 2021-06-21 NOTE — ED Notes (Signed)
Ambulated from triage to room 5, SpO2 95-100%, denies DOE, HR max 107.

## 2021-06-21 NOTE — ED Triage Notes (Signed)
Pt arrives ambulatory to ED with reports of feeling sick for the last couple of days states that she has had some SOB. Pt reports she does have asthma but states she has called EMS over the past few days r/t her SOB. Also reports some vomiting. Pt has not tested for covid at home, denies being around anyone with covid or flu.

## 2021-06-21 NOTE — ED Notes (Signed)
HFA albuterol with Aerochamber given, patient demonstrated understanding and proper technique.

## 2021-06-21 NOTE — ED Provider Notes (Incomplete)
Pt with symptoms consistent with viral URI and bronchitis.  Well appearing here but is wheezing diffusely and that was after the first neb with albuterol/atrovent.  Mildly tachypneic here.  No signs of pharyngitis, otitis or abnormal abdominal findings.   CXR wnl and pt to return with any further problems.

## 2021-06-21 NOTE — ED Notes (Signed)
First contact with patient. Patient arrived via triage from home with complaints of wheezing/shortness of breath with little to no relief from inhaler x 1 week. Pt is A&OX 4. Respirations even/unlabored - speaking in full sentences. Bilateral rhonchi noted. Patient changed into gown and placed on monitor and call light within reach. Patient updated on plan of care. Will continue to monitor patient.

## 2021-06-21 NOTE — Discharge Instructions (Addendum)
Today you were evaluated for worsening shortness of breath cough with shortness of breath.  Fortunately you were found to be negative for COVID-19 and the flu, and your chest x-ray was not concerning for pneumonia.  You received several breathing treatments as well as a dose of steroids which helped your symptoms.  I will send in a 5-day course of steroids for you to take which I hope will continue to help with your breathing.  You are also leaving with an inhaler.  Recommend that you return for reevaluation if you begin having chest pain, trouble breathing, worsening fever, or overall feel like you are getting worse.

## 2021-06-21 NOTE — ED Provider Notes (Signed)
[facility] EMERGENCY DEPARTMENT Provider Note   CSN: [id number] Arrival date & time: 06/21/21  1015     History  Chief Complaint  Patient presents with   Shortness of Breath    Diana Joyce is a 31 y.o. adult with pertinent past medical history of albuterol who presents with 5 days of malaise, shortness of breath, cough, nausea and vomiting, fever to 101.  The patient states that over the last several days he has been feeling poorly despite over-the-counter cold remedies and rest.  He has had decreased appetite as well as some body aches that she attributes to being related to cough.  Patient states that he does not normally need to use his an albuterol inhaler, however over the last several days he has required it 3-4 times per day.  There is some relief of symptoms immediately after using the inhaler however they eventually returned.  Overall the patient has been feeling worse over the last 4 days.  Patient has not required hospital evaluation or admission secondary to asthma exacerbation and denies functional limitations secondary to asthma symptoms.    Home Medications Prior to Admission medications   Medication Sig Start Date End Date Taking? Authorizing Provider  albuterol (VENTOLIN HFA) 108 (90 Base) MCG/ACT inhaler Inhale 2 puffs into the lungs every 6 (six) hours as needed for wheezing or shortness of breath. 06/12/21   Charlott Rakes, MD  aluminum chloride (DRYSOL) 20 % external solution Apply topically at bedtime. Patient not taking: Reported on 08/01/2020 03/06/20   Charlott Rakes, MD  blood glucose meter kit and supplies KIT Dispense with test strips and lancets based on insurance preference. Use up to four times daily as directed. (FOR ICD-9 250.00, 250.01). 12/07/17   Charlott Rakes, MD  Blood Glucose Monitoring Suppl (TRUE METRIX METER) w/Device KIT Check blood sugars fasting and at bedtime daily 10/29/20   Charlott Rakes, MD  dapagliflozin propanediol (FARXIGA) 10  MG TABS tablet Take 1 tablet (10 mg total) by mouth daily before breakfast. 06/12/21   Charlott Rakes, MD  Dulaglutide (TRULICITY) 4.5 HU/3.1SH SOPN Inject 4.5 mg as directed once a week. 06/12/21   Charlott Rakes, MD  glipiZIDE (GLIPIZIDE XL) 10 MG 24 hr tablet Take 2 tablets (20 mg total) by mouth daily with breakfast. 06/12/21   Charlott Rakes, MD  glucose blood (TRUE METRIX BLOOD GLUCOSE TEST) test strip Use as instructed 03/29/21   Charlott Rakes, MD  insulin glargine (LANTUS) 100 UNIT/ML injection Inject 0.5 mLs (50 Units total) into the skin daily. 06/12/21   Charlott Rakes, MD  insulin lispro (HUMALOG) 100 UNIT/ML KwikPen INJECT 0-12 UNITS INTO THE SKIN 3 (THREE) TIMES DAILY. ACCORDING TO SLIDING SCALE 06/12/21   Charlott Rakes, MD  Insulin Pen Needle (PEN NEEDLES) 32G X 4 MM MISC 1 Dose by Does not apply route daily. 04/13/19   Charlott Rakes, MD  NEEDLE, DISP, 18 G 18G X 1" MISC Use as directed 03/15/21     NEEDLE, DISP, 18 G 18G X 1" MISC See admin instructions. 05/21/21     NEEDLE, DISP, 25 G (BD DISP NEEDLES) 25G X 5/8" MISC See admin instructions. 05/21/21     NEEDLE, DISP, 25 G 25G X 5/8" MISC Use as directed 03/15/21     Norgestimate-Ethinyl Estradiol Triphasic (ORTHO TRI-CYCLEN LO) 0.18/0.215/0.25 MG-25 MCG tab Take 1 tablet by mouth daily. Patient not taking: Reported on 10/12/2018 07/13/18   Charlott Rakes, MD  nystatin (MYCOSTATIN/NYSTOP) powder Apply 1 application topically 3 (three) times daily.  Patient not taking: Reported on 10/29/2020 03/06/20   Charlott Rakes, MD  predniSONE (DELTASONE) 20 MG tablet Take 2 tablets (40 mg total) by mouth daily with breakfast for 5 days. 06/21/21 06/26/21  Farrel Gordon, DO  Syringe, Disposable, (B-D SYRINGE LUER-LOK 1CC) 1 ML MISC use as directed 03/15/21     Syringe, Disposable, (BD PLASTIPAK SYRINGE) 3 ML MISC See admin instructions. 05/21/21     testosterone cypionate (DEPOTESTOSTERONE CYPIONATE) 200 MG/ML injection Inject 0.4 mLs (80 mg total) into  the skin once a week. 05/21/21     TRUEplus Lancets 28G MISC Use as directed 3 (three) times daily before meals. 10/29/20   Charlott Rakes, MD      Allergies    Benadryl [diphenhydramine hcl]    Review of Systems   Review of Systems  Constitutional:  Positive for appetite change, chills, diaphoresis, fatigue and fever.  HENT:  Positive for congestion.   Respiratory:  Positive for cough, chest tightness and shortness of breath. Negative for apnea.   Cardiovascular:  Negative for chest pain.  Gastrointestinal:  Positive for nausea and vomiting. Negative for diarrhea.  Musculoskeletal:  Positive for myalgias.   Physical Exam Updated Vital Signs BP (!) 143/76    Pulse 92    Temp 98.3 F (36.8 C) (Oral)    Resp 16    Ht 5' 5"  (1.651 m)    Wt (!) 143.8 kg    SpO2 97%    BMI 52.75 kg/m  Constitutional: No acute distress noted. Cardio: Regular rate and rhythm.  No murmurs, rubs, gallops. Pulm: Diffuse expiratory rhonchi.  Normal work of breathing on room air. Abdomen: Soft, nontender, nondistended. MSK: Negative for extremity edema. Skin: Skin skin is warm with some diaphoresis. Neuro: Alert and oriented x3.  No focal deficit noted. Psych: Normal mood and affect.  ED Results / Procedures / Treatments   Labs (all labs ordered are listed, but only abnormal results are displayed) Labs Reviewed  RESP PANEL BY RT-PCR (FLU A&B, COVID) [id number]    EKG None  Radiology DG [facility] 1 View  Result Date: 06/21/2021 CLINICAL DATA:  Wheezing and shortness of breath. EXAM: PORTABLE CHEST 1 VIEW COMPARISON:  06/14/2020 FINDINGS: Both lungs are clear. Heart and mediastinum are within normal limits. Negative for a pneumothorax. Trachea is midline. Bone structures are intact. IMPRESSION: No active disease. Electronically Signed   By: Markus Daft M.D.   On: 06/21/2021 11:38    Procedures None  Medications Ordered in ED Medications  albuterol (VENTOLIN HFA) 108 (90 Base) MCG/ACT inhaler 1-2 puff  (1 puff Inhalation Given 06/21/21 1335)  ipratropium-albuterol (DUONEB) 0.5-2.5 (3) MG/3ML nebulizer solution 3 mL ( Nebulization Not Given 06/21/21 1239)  albuterol (PROVENTIL) (2.5 MG/3ML) 0.083% nebulizer solution 2.5 mg ( Nebulization Not Given 06/21/21 1238)  predniSONE (DELTASONE) tablet 40 mg (40 mg Oral Given 06/21/21 1116)  albuterol (PROVENTIL) (2.5 MG/3ML) 0.083% nebulizer solution 2.5 mg (2.5 mg Nebulization Given 06/21/21 1150)  ipratropium-albuterol (DUONEB) 0.5-2.5 (3) MG/3ML nebulizer solution 3 mL (3 mLs Nebulization Given 06/21/21 1150)  albuterol (VENTOLIN) (5 MG/ML) 0.5% continuous inhalation solution (  Given 06/21/21 1153)  albuterol (PROVENTIL) (2.5 MG/3ML) 0.083% nebulizer solution 5 mg (5 mg Nebulization Given 06/21/21 1330)    ED Course/ Medical Decision Making/ A&P   Patient presents with 6 days of cough, shortness of breath, malaise with more recent onset of congestion with reported nonresponse to over-the-counter cold remedies.  Patient has had an increased need for home albuterol treatments over  the last several days as well.  Differential includes URI, bronchitis, pneumonia.  Will initiate DuoNeb and Proventil therapy at this time.  Will give prednisone 40 mg as well.  Chest x-ray ordered.  1146: Chest x-ray negative for acute processes.  Second treatment of DuoNeb/Proventil ordered.  1253: Respiratory panel is negative for COVID-19 and influenza.   1324: On reevaluation patient reports feeling an improvement of his symptoms with the two  breathing treatments and steroid dose.  We will proceed with an additional albuterol treatment at this time.  Given the improvement he endorses in the interim I feel that he likely will be medically stable for discharge following this last treatment.  1427: Patient continues to endorse improvement of symptoms.  He is medically stable for discharge home at this time.  Final Clinical Impression(s) / ED Diagnoses Final diagnoses:   Bronchitis    Rx / DC Orders ED Discharge Orders          Ordered    predniSONE (DELTASONE) 20 MG tablet  Daily with breakfast,   Status:  Discontinued        06/21/21 1420    predniSONE (DELTASONE) 20 MG tablet  Daily with breakfast        06/21/21 [place], [place], DO 06/21/21 1428    Blanchie Dessert, MD 06/22/21 [phone]

## 2021-06-24 ENCOUNTER — Other Ambulatory Visit: Payer: Self-pay

## 2021-06-27 ENCOUNTER — Other Ambulatory Visit: Payer: Self-pay | Admitting: Family Medicine

## 2021-06-27 ENCOUNTER — Encounter: Payer: Self-pay | Admitting: Family Medicine

## 2021-06-27 ENCOUNTER — Other Ambulatory Visit: Payer: Self-pay

## 2021-06-27 MED ORDER — SEMAGLUTIDE (2 MG/DOSE) 8 MG/3ML ~~LOC~~ SOPN: 2.0000 mg | PEN_INJECTOR | SUBCUTANEOUS | 3 refills | Status: DC

## 2021-07-02 ENCOUNTER — Other Ambulatory Visit: Payer: Self-pay

## 2021-07-03 ENCOUNTER — Telehealth: Payer: Self-pay

## 2021-07-03 NOTE — Telephone Encounter (Signed)
PA approved for Ozempic until 07/03/2022.  Pharmacy aware.

## 2021-07-04 ENCOUNTER — Other Ambulatory Visit: Payer: Self-pay

## 2021-07-04 ENCOUNTER — Other Ambulatory Visit: Payer: Self-pay | Admitting: Family Medicine

## 2021-07-04 MED ORDER — TIRZEPATIDE 2.5 MG/0.5ML ~~LOC~~ SOAJ
2.5000 mg | SUBCUTANEOUS | 3 refills | Status: DC
  Filled 2021-07-04 – 2021-07-16 (×3): qty 2, 28d supply, fill #0

## 2021-07-09 ENCOUNTER — Other Ambulatory Visit: Payer: Self-pay

## 2021-07-10 ENCOUNTER — Other Ambulatory Visit: Payer: Self-pay

## 2021-07-11 ENCOUNTER — Other Ambulatory Visit: Payer: Self-pay

## 2021-07-15 ENCOUNTER — Other Ambulatory Visit: Payer: Self-pay | Admitting: Family Medicine

## 2021-07-16 ENCOUNTER — Other Ambulatory Visit: Payer: Self-pay

## 2021-07-17 ENCOUNTER — Other Ambulatory Visit: Payer: Self-pay

## 2021-07-19 ENCOUNTER — Other Ambulatory Visit: Payer: Self-pay

## 2021-07-22 ENCOUNTER — Other Ambulatory Visit (HOSPITAL_COMMUNITY): Payer: Self-pay

## 2021-07-24 ENCOUNTER — Other Ambulatory Visit (HOSPITAL_COMMUNITY): Payer: Self-pay

## 2021-07-30 ENCOUNTER — Other Ambulatory Visit (HOSPITAL_COMMUNITY): Payer: Self-pay

## 2021-07-31 ENCOUNTER — Other Ambulatory Visit (HOSPITAL_COMMUNITY): Payer: Self-pay

## 2021-08-02 ENCOUNTER — Other Ambulatory Visit: Payer: Self-pay

## 2021-08-12 ENCOUNTER — Other Ambulatory Visit: Payer: Self-pay

## 2021-08-12 ENCOUNTER — Encounter: Payer: Self-pay | Admitting: Family Medicine

## 2021-08-12 ENCOUNTER — Ambulatory Visit: Payer: 59 | Attending: Family Medicine | Admitting: Family Medicine

## 2021-08-12 VITALS — BP 130/90 | HR 85 | Ht 65.0 in | Wt 311.2 lb

## 2021-08-12 DIAGNOSIS — E1165 Type 2 diabetes mellitus with hyperglycemia: Secondary | ICD-10-CM

## 2021-08-12 DIAGNOSIS — Z6841 Body Mass Index (BMI) 40.0 and over, adult: Secondary | ICD-10-CM | POA: Diagnosis not present

## 2021-08-12 DIAGNOSIS — Z794 Long term (current) use of insulin: Secondary | ICD-10-CM | POA: Diagnosis not present

## 2021-08-12 DIAGNOSIS — R030 Elevated blood-pressure reading, without diagnosis of hypertension: Secondary | ICD-10-CM | POA: Diagnosis not present

## 2021-08-12 LAB — GLUCOSE, POCT (MANUAL RESULT ENTRY): POC Glucose: 106 mg/dl — AB (ref 70–99)

## 2021-08-12 LAB — POCT GLYCOSYLATED HEMOGLOBIN (HGB A1C): HbA1c, POC (controlled diabetic range): 8 % — AB (ref 0.0–7.0)

## 2021-08-12 MED ORDER — TIRZEPATIDE 2.5 MG/0.5ML ~~LOC~~ SOAJ
2.5000 mg | SUBCUTANEOUS | 3 refills | Status: DC
  Filled 2021-08-12: qty 2, 28d supply, fill #0
  Filled 2021-09-12: qty 2, 28d supply, fill #1
  Filled 2021-10-11: qty 2, 28d supply, fill #2
  Filled 2021-11-08: qty 2, 28d supply, fill #3

## 2021-08-12 NOTE — Patient Instructions (Signed)

## 2021-08-12 NOTE — Progress Notes (Signed)
Subjective:  Patient ID: Diana Joyce, adult    DOB: [birth year]  Age: 31 y.o. MRN: [id number]  CC: Diabetes   HPI Diana Joyce is a 31 y.o. year old adult with a history of type 2 diabetes mellitus (A1c 8.0), asthma, morbid obesity, on testosterone therapy from [facility] for gender transition (female to female)  Interval History: A1c is 8.0 down from 10.5 previously. Blood sugars are 75 fasting with lowest of 64 and highest 160.  He was without Trulicity for 1 month and this was switched to Ozempic which he also had a difficult time obtaining from the pharmacy due to the shortage and she was subsequently changed to [facility]. Just started Mounjaro 1 month ago and he seems to prefer Mounjaro to the other GLP-1 receptor agonist with a weight loss of 6 pounds in 6 weeks.  He has no numbness in extremities and currently does not have visual concerns but did have some blurry vision after treated with steroids a couple weeks ago which has resolved.  He recently had an eye exam last month which was normal.  Blood pressure is slightly elevated today but he has no known history of hypertension. Past Medical History:  Diagnosis Date   Apnea, sleep    Asthma    Diabetes ([place])     Past Surgical History:  Procedure Laterality Date   FRACTURE SURGERY      Family History  Problem Relation Age of Onset   Kidney failure Mother    Hypertension Mother    Diabetes Mother    Heart failure Mother    Diabetes Father     Allergies  Allergen Reactions   Benadryl [Diphenhydramine Hcl] Other (See Comments)    Hives, coughing, itching.     Outpatient Medications Prior to Visit  Medication Sig Dispense Refill   albuterol (VENTOLIN HFA) 108 (90 Base) MCG/ACT inhaler Inhale 2 puffs into the lungs every 6 (six) hours as needed for wheezing or shortness of breath. 18 g 2   blood glucose meter kit and supplies KIT Dispense with test strips and lancets based on insurance preference. Use up to  four times daily as directed. (FOR ICD-9 250.00, 250.01). 100 each 12   Blood Glucose Monitoring Suppl (TRUE METRIX METER) w/Device KIT Check blood sugars fasting and at bedtime daily 1 kit 1   dapagliflozin propanediol (FARXIGA) 10 MG TABS tablet Take 1 tablet (10 mg total) by mouth daily before breakfast. 90 tablet 1   glipiZIDE (GLIPIZIDE XL) 10 MG 24 hr tablet Take 2 tablets (20 mg total) by mouth daily with breakfast. 180 tablet 1   glucose blood (TRUE METRIX BLOOD GLUCOSE TEST) test strip Use as instructed 100 each 12   insulin glargine (LANTUS) 100 UNIT/ML injection Inject 0.5 mLs (50 Units total) into the skin daily. 190 mL 3   insulin lispro (HUMALOG) 100 UNIT/ML KwikPen INJECT 0-12 UNITS INTO THE SKIN 3 (THREE) TIMES DAILY. ACCORDING TO SLIDING SCALE 30 mL 6   Insulin Pen Needle (PEN NEEDLES) 32G X 4 MM MISC 1 Dose by Does not apply route daily. 100 each 1   NEEDLE, DISP, 18 G 18G X 1" MISC Use as directed 20 each 0   NEEDLE, DISP, 18 G 18G X 1" MISC See admin instructions. 20 each 0   NEEDLE, DISP, 25 G (BD DISP NEEDLES) 25G X 5/8" MISC See admin instructions. 20 each 0   NEEDLE, DISP, 25 G 25G X 5/8" MISC Use as directed 20 each  0   Syringe, Disposable, (B-D SYRINGE LUER-LOK 1CC) 1 ML MISC use as directed 20 each 0   Syringe, Disposable, (BD PLASTIPAK SYRINGE) 3 ML MISC See admin instructions. 20 each 0   testosterone cypionate (DEPOTESTOSTERONE CYPIONATE) 200 MG/ML injection Inject 0.4 mLs (80 mg total) into the skin once a week. 6 mL 0   TRUEplus Lancets 28G MISC Use as directed 3 (three) times daily before meals. 100 each 12   tirzepatide (MOUNJARO) 2.5 MG/0.5ML Pen Inject 2.5 mg into the skin once a week for 28 days. Then increase to 5 mg once weekly thereafter. 2 mL 3   aluminum chloride (DRYSOL) 20 % external solution Apply topically at bedtime. (Patient not taking: Reported on 08/01/2020) 35 mL 1   Norgestimate-Ethinyl Estradiol Triphasic (ORTHO TRI-CYCLEN LO) 0.18/0.215/0.25  MG-25 MCG tab Take 1 tablet by mouth daily. (Patient not taking: Reported on 10/12/2018) 1 Package 11   nystatin (MYCOSTATIN/NYSTOP) powder Apply 1 application topically 3 (three) times daily. (Patient not taking: Reported on 10/29/2020) 60 g 1   Semaglutide, 2 MG/DOSE, 8 MG/3ML SOPN Inject 2 mg as directed once a week. (Patient not taking: Reported on 08/12/2021) 3 mL 3   No facility-administered medications prior to visit.     ROS Review of Systems  Constitutional:  Negative for activity change, appetite change and fatigue.  HENT:  Negative for congestion, sinus pressure and sore throat.   Eyes:  Negative for visual disturbance.  Respiratory:  Negative for cough, chest tightness, shortness of breath and wheezing.   Cardiovascular:  Negative for chest pain and palpitations.  Gastrointestinal:  Negative for abdominal distention, abdominal pain and constipation.  Endocrine: Negative for polydipsia.  Genitourinary:  Negative for dysuria and frequency.  Musculoskeletal:  Negative for arthralgias and back pain.  Skin:  Negative for rash.  Neurological:  Negative for tremors, light-headedness and numbness.  Hematological:  Does not bruise/bleed easily.  Psychiatric/Behavioral:  Negative for agitation and behavioral problems.    Objective:  BP 130/90    Pulse 85    Ht 5' 5" (1.651 m)    Wt (!) 311 lb 3.2 oz (141.2 kg)    SpO2 98%    BMI 51.79 kg/m   BP/Weight 08/12/2021 06/21/2021 70/62/3762  Systolic BP 831 517 616  Diastolic BP 90 84 87  Wt. (Lbs) 311.2 317 319.2  BMI 51.79 52.75 53.12      Physical Exam Constitutional:      Appearance: He is well-developed.  Cardiovascular:     Rate and Rhythm: Normal rate.     Heart sounds: Normal heart sounds. No murmur heard. Pulmonary:     Effort: Pulmonary effort is normal.     Breath sounds: Normal breath sounds. No wheezing or rales.  Chest:     Chest wall: No tenderness.  Abdominal:     General: Bowel sounds are normal. There is no  distension.     Palpations: Abdomen is soft. There is no mass.     Tenderness: There is no abdominal tenderness.  Musculoskeletal:        General: Normal range of motion.     Right lower leg: No edema.     Left lower leg: No edema.  Neurological:     Mental Status: He is alert and oriented to person, place, and time.  Psychiatric:        Mood and Affect: Mood normal.    CMP Latest Ref Rng & Units 05/08/2021 08/01/2020 03/06/2020  Glucose 70 - 99 mg/dL  179(H) 228(H) 253(H)  BUN 6 - 20 mg/dL _0 Creatinine 0.57 - 1.00 mg/dL 0.76 0.65 0.62  Sodium 134 - 144 mmol/L 137 137 136  Potassium 3.5 - 5.2 mmol/L 4.5 4.5 4.4  Chloride 96 - 106 mmol/L 99 101 101  CO2 20 - 29 mmol/L _1 Calcium 8.7 - 10.2 mg/dL 8.9 9.0 8.9  Total Protein 6.0 - 8.5 g/dL - 7.1 -  Total Bilirubin 0.0 - 1.2 mg/dL - 0.2 -  Alkaline Phos 44 - 121 IU/L - 125(H) -  AST 0 - 40 IU/L - 14 -  ALT 0 - 32 IU/L - 20 -    Lipid Panel     Component Value Date/Time   CHOL 167 08/01/2020 1033   TRIG 102 08/01/2020 1033   HDL 40 08/01/2020 1033   CHOLHDL 4.2 08/01/2020 1033   LDLCALC 108 (H) 08/01/2020 1033    CBC    Component Value Date/Time   WBC 10.6 (H) 07/21/2019 0507   RBC 4.79 07/21/2019 0507   HGB 11.3 (L) 07/21/2019 0507   HCT 36.4 07/21/2019 0507   PLT 415 (H) 07/21/2019 0507   MCV 76.0 (L) 07/21/2019 0507   MCH 23.6 (L) 07/21/2019 0507   MCHC 31.0 07/21/2019 0507   RDW 15.3 07/21/2019 0507   LYMPHSABS 2.7 06/07/2019 1920   MONOABS 0.6 06/07/2019 1920   EOSABS 0.1 06/07/2019 1920   BASOSABS 0.0 06/07/2019 1920    Lab Results  Component Value Date   HGBA1C 8.0 (A) 08/12/2021    Assessment & Plan:  1. Type 2 diabetes mellitus with hyperglycemia, with long-term current use of insulin (HCC) Improved with A1c of 8.0 down from 10.5; goal is less than 7.0 Commended on improvement We will hold off on increasing Mounjaro dose due to the fact that he has been on it for only 1 month Continue  Lantus and glipizide Counseled on Diabetic diet, my plate method, 161 minutes of moderate intensity exercise/week Blood sugar logs with fasting goals of 80-120 mg/dl, random of less than 180 and in the event of sugars less than 60 mg/dl or greater than 400 mg/dl encouraged to notify the clinic. Advised on the need for annual eye exams, annual foot exams, Pneumonia vaccine. - POCT glucose (manual entry) - POCT glycosylated hemoglobin (Hb A1C) - tirzepatide (MOUNJARO) 2.5 MG/0.5ML Pen; Inject 2.5 mg into the skin once a week for 28 days.  Dispense: 2 mL; Refill: 3 - Microalbumin / creatinine urine ratio - LP+Non-HDL Cholesterol - CMP14+EGFR  2. Morbid obesity ([place]) Commended on 6 pound weight loss in the last 6 weeks Counseled on 150 minutes of exercise per week, healthy eating (including decreased daily intake of saturated fats, cholesterol, added sugars, sodium).   3. Elevated blood pressure reading in office without diagnosis of hypertension Lifestyle modifications and will reassess at next office visit Counseled on blood pressure goal of less than 130/80, low-sodium, DASH diet, medication compliance, 150 minutes of moderate intensity exercise per week. Discussed medication compliance, adverse effects.     Meds ordered this encounter  Medications   tirzepatide (MOUNJARO) 2.5 MG/0.5ML Pen    Sig: Inject 2.5 mg into the skin once a week for 28 days.    Dispense:  2 mL    Refill:  3    Follow-up: Return in about 1 month (around 09/12/2021) for Pap smear.       Charlott Rakes, MD, FAAFP. [place], [place]  [phone]   08/12/2021, 12:52 PM

## 2021-08-12 NOTE — Progress Notes (Signed)
Needs medication refills.

## 2021-08-13 LAB — CMP14+EGFR
ALT: 20 IU/L (ref 0–32)
AST: 22 IU/L (ref 0–40)
Albumin/Globulin Ratio: 1.3 (ref 1.2–2.2)
Albumin: 4.2 g/dL (ref 3.9–5.0)
Alkaline Phosphatase: 104 IU/L (ref 44–121)
BUN/Creatinine Ratio: 11 (ref 9–23)
BUN: 9 mg/dL (ref 6–20)
Bilirubin Total: 0.4 mg/dL (ref 0.0–1.2)
CO2: 24 mmol/L (ref 20–29)
Calcium: 9 mg/dL (ref 8.7–10.2)
Chloride: 103 mmol/L (ref 96–106)
Creatinine, Ser: 0.85 mg/dL (ref 0.57–1.00)
Globulin, Total: 3.3 g/dL (ref 1.5–4.5)
Glucose: 100 mg/dL — ABNORMAL HIGH (ref 70–99)
Potassium: 4.6 mmol/L (ref 3.5–5.2)
Sodium: 139 mmol/L (ref 134–144)
Total Protein: 7.5 g/dL (ref 6.0–8.5)
eGFR: 95 mL/min/{1.73_m2} (ref >59)

## 2021-08-13 LAB — MICROALBUMIN / CREATININE URINE RATIO
Creatinine, Urine: 84.5 mg/dL
Microalb/Creat Ratio: 18 mg/g creat (ref 0–29)
Microalbumin, Urine: 15 ug/mL

## 2021-08-13 LAB — LP+NON-HDL CHOLESTEROL
Cholesterol, Total: 157 mg/dL (ref 100–199)
HDL: 29 mg/dL — ABNORMAL LOW (ref >39)
LDL Chol Calc (NIH): 109 mg/dL — ABNORMAL HIGH (ref 0–99)
Total Non-HDL-Chol (LDL+VLDL): 128 mg/dL (ref 0–129)
Triglycerides: 103 mg/dL (ref 0–149)
VLDL Cholesterol Cal: 19 mg/dL (ref 5–40)

## 2021-08-15 ENCOUNTER — Other Ambulatory Visit (HOSPITAL_COMMUNITY): Payer: Self-pay

## 2021-08-16 ENCOUNTER — Other Ambulatory Visit: Payer: Self-pay

## 2021-08-16 ENCOUNTER — Other Ambulatory Visit (HOSPITAL_COMMUNITY): Payer: Self-pay

## 2021-08-19 ENCOUNTER — Other Ambulatory Visit (HOSPITAL_COMMUNITY): Payer: Self-pay

## 2021-08-19 MED ORDER — TESTOSTERONE CYPIONATE 200 MG/ML IM SOLN
80.0000 mg | INTRAMUSCULAR | 0 refills | Status: DC
Start: 2021-08-19 — End: 2021-09-04
  Filled 2021-08-19: qty 1, 7d supply, fill #0

## 2021-08-23 ENCOUNTER — Other Ambulatory Visit: Payer: Self-pay

## 2021-08-26 ENCOUNTER — Other Ambulatory Visit (HOSPITAL_COMMUNITY): Payer: Self-pay

## 2021-09-04 ENCOUNTER — Other Ambulatory Visit (HOSPITAL_COMMUNITY): Payer: Self-pay

## 2021-09-04 MED ORDER — "EASY TOUCH FLIPLOCK NEEDLES 18G X 1"" MISC": 0 refills | Status: DC

## 2021-09-04 MED ORDER — TESTOSTERONE CYPIONATE 200 MG/ML IM SOLN
75.0000 mg | INTRAMUSCULAR | 0 refills | Status: DC
Start: 2021-09-04 — End: 2021-09-17
  Filled 2021-09-04: qty 2, 14d supply, fill #0

## 2021-09-04 MED ORDER — BD SYRINGE LUER-LOK 1 ML MISC: 0 refills | Status: AC

## 2021-09-04 MED ORDER — "EASY TOUCH FLIPLOCK NEEDLES 25G X 5/8"" MISC": 0 refills | Status: AC

## 2021-09-12 ENCOUNTER — Other Ambulatory Visit: Payer: Self-pay

## 2021-09-12 ENCOUNTER — Ambulatory Visit: Payer: 59 | Attending: Family Medicine | Admitting: Family Medicine

## 2021-09-12 ENCOUNTER — Encounter: Payer: Self-pay | Admitting: Family Medicine

## 2021-09-12 ENCOUNTER — Other Ambulatory Visit (HOSPITAL_COMMUNITY): Payer: Self-pay

## 2021-09-12 ENCOUNTER — Other Ambulatory Visit (HOSPITAL_COMMUNITY)
Admission: RE | Admit: 2021-09-12 | Discharge: 2021-09-12 | Disposition: A | Discharge status: Home / Self Care | Payer: 59 | Source: Ambulatory Visit | Attending: Family Medicine | Admitting: Family Medicine

## 2021-09-12 VITALS — BP 113/72 | HR 84 | Ht 65.0 in | Wt 307.4 lb

## 2021-09-12 DIAGNOSIS — Z124 Encounter for screening for malignant neoplasm of cervix: Secondary | ICD-10-CM | POA: Diagnosis present

## 2021-09-12 DIAGNOSIS — Z113 Encounter for screening for infections with a predominantly sexual mode of transmission: Secondary | ICD-10-CM

## 2021-09-12 NOTE — Progress Notes (Signed)
? ?Subjective:  ?Patient ID: Diana Joyce, adult    DOB: February 18, 1991  Age: 31 y.o. MRN: [id number] ? ?CC: Gynecologic Exam ? ? ?HPI ?Diana Joyce is a 31 y.o. year old adult with a history of type 2 diabetes mellitus (A1c 8.0), asthma, morbid obesity, on testosterone therapy from [facility] for gender transition (female to female) ? ?Interval History: ?She presents today for gynecological exam and is due for cervical cancer screening. ?He would also like STD screening. ? ?Past Medical History:  ?Diagnosis Date  ? Apnea, sleep   ? Asthma   ? Diabetes ([place])   ? ? ?Past Surgical History:  ?Procedure Laterality Date  ? FRACTURE SURGERY    ? ? ?Family History  ?Problem Relation Age of Onset  ? Kidney failure Mother   ? Hypertension Mother   ? Diabetes Mother   ? Heart failure Mother   ? Diabetes Father   ? ? ?Social History  ? ?Socioeconomic History  ? Marital status: Single  ?  Spouse name: Not on file  ? Number of children: Not on file  ? Years of education: Not on file  ? Highest education level: Not on file  ?Occupational History  ? Not on file  ?Tobacco Use  ? Smoking status: Every Day  ?  Types: Cigars  ? Smokeless tobacco: Never  ?Vaping Use  ? Vaping Use: Never used  ?Substance and Sexual Activity  ? Alcohol use: Yes  ? Drug use: No  ? Sexual activity: Yes  ?  Birth control/protection: None  ?Other Topics Concern  ? Not on file  ?Social History Narrative  ? Not on file  ? ?Social Determinants of Health  ? ?Financial Resource Strain: Not on file  ?Food Insecurity: Not on file  ?Transportation Needs: Not on file  ?Physical Activity: Not on file  ?Stress: Not on file  ?Social Connections: Not on file  ? ? ?Allergies  ?Allergen Reactions  ? Benadryl [Diphenhydramine Hcl] Other (See Comments)  ?  Hives, coughing, itching.   ? ? ?Outpatient Medications Prior to Visit  ?Medication Sig Dispense Refill  ? albuterol (VENTOLIN HFA) 108 (90 Base) MCG/ACT inhaler Inhale 2 puffs into the lungs every 6 (six) hours as  needed for wheezing or shortness of breath. 18 g 2  ? blood glucose meter kit and supplies KIT Dispense with test strips and lancets based on insurance preference. Use up to four times daily as directed. (FOR ICD-9 250.00, 250.01). 100 each 12  ? Blood Glucose Monitoring Suppl (TRUE METRIX METER) w/Device KIT Check blood sugars fasting and at bedtime daily 1 kit 1  ? dapagliflozin propanediol (FARXIGA) 10 MG TABS tablet Take 1 tablet (10 mg total) by mouth daily before breakfast. 90 tablet 1  ? glipiZIDE (GLIPIZIDE XL) 10 MG 24 hr tablet Take 2 tablets (20 mg total) by mouth daily with breakfast. 180 tablet 1  ? glucose blood (TRUE METRIX BLOOD GLUCOSE TEST) test strip Use as instructed 100 each 12  ? insulin glargine (LANTUS) 100 UNIT/ML injection Inject 0.5 mLs (50 Units total) into the skin daily. 190 mL 3  ? insulin lispro (HUMALOG) 100 UNIT/ML KwikPen INJECT 0-12 UNITS INTO THE SKIN 3 (THREE) TIMES DAILY. ACCORDING TO SLIDING SCALE 30 mL 6  ? Insulin Pen Needle (PEN NEEDLES) 32G X 4 MM MISC 1 Dose by Does not apply route daily. 100 each 1  ? NEEDLE, DISP, 18 G (EASY TOUCH FLIPLOCK NEEDLES) 18G X 1" MISC use as directed 20  each 0  ? NEEDLE, DISP, 18 G 18G X 1" MISC Use as directed 20 each 0  ? NEEDLE, DISP, 18 G 18G X 1" MISC See admin instructions. 20 each 0  ? NEEDLE, DISP, 25 G (BD DISP NEEDLES) 25G X 5/8" MISC See admin instructions. 20 each 0  ? NEEDLE, DISP, 25 G (EASY TOUCH FLIPLOCK NEEDLES) 25G X 5/8" MISC use as directed 20 each 0  ? NEEDLE, DISP, 25 G 25G X 5/8" MISC Use as directed 20 each 0  ? Syringe, Disposable, (B-D SYRINGE LUER-LOK 1CC) 1 ML MISC use as directed 20 each 0  ? Syringe, Disposable, (B-D SYRINGE LUER-LOK 1CC) 1 ML MISC use as directed 20 each 0  ? Syringe, Disposable, (BD PLASTIPAK SYRINGE) 3 ML MISC See admin instructions. 20 each 0  ? testosterone cypionate (DEPOTESTOSTERONE CYPIONATE) 200 MG/ML injection Inject 0.375 mLs (75 mg total) into the skin once a week. 2 mL 0  ?  tirzepatide (MOUNJARO) 2.5 MG/0.5ML Pen Inject 2.5 mg into the skin once a week for 28 days. 2 mL 3  ? TRUEplus Lancets 28G MISC Use as directed 3 (three) times daily before meals. 100 each 12  ? aluminum chloride (DRYSOL) 20 % external solution Apply topically at bedtime. (Patient not taking: Reported on 08/01/2020) 35 mL 1  ? Norgestimate-Ethinyl Estradiol Triphasic (ORTHO TRI-CYCLEN LO) 0.18/0.215/0.25 MG-25 MCG tab Take 1 tablet by mouth daily. (Patient not taking: Reported on 10/12/2018) 1 Package 11  ? nystatin (MYCOSTATIN/NYSTOP) powder Apply 1 application topically 3 (three) times daily. (Patient not taking: Reported on 10/29/2020) 60 g 1  ? ?No facility-administered medications prior to visit.  ? ? ? ?ROS ?Review of Systems  ?Constitutional:  Negative for activity change, appetite change and fatigue.  ?HENT:  Negative for congestion, sinus pressure and sore throat.   ?Eyes:  Negative for visual disturbance.  ?Respiratory:  Negative for cough, chest tightness, shortness of breath and wheezing.   ?Cardiovascular:  Negative for chest pain and palpitations.  ?Gastrointestinal:  Negative for abdominal distention, abdominal pain and constipation.  ?Endocrine: Negative for polydipsia.  ?Genitourinary:  Negative for dysuria and frequency.  ?Musculoskeletal:  Negative for arthralgias and back pain.  ?Skin:  Negative for rash.  ?Neurological:  Negative for tremors, light-headedness and numbness.  ?Hematological:  Does not bruise/bleed easily.  ?Psychiatric/Behavioral:  Negative for agitation and behavioral problems.   ? ?Objective:  ?BP 113/72   Pulse 84   Ht 5' 5"  (1.651 m)   Wt (!) 307 lb 6.4 oz (139.4 kg)   SpO2 99%   BMI 51.15 kg/m?  ? ? ?  09/12/2021  ?  9:50 AM 08/12/2021  ? 10:03 AM 08/12/2021  ?  9:06 AM  ?BP/Weight  ?Systolic BP 196 222 979  ?Diastolic BP 72 90 82  ?Wt. (Lbs) 307.4  311.2  ?BMI 51.15 kg/m2  51.79 kg/m2  ? ? ? ? ?Physical Exam ?Constitutional:   ?   Appearance: He is well-developed.   ?Cardiovascular:  ?   Rate and Rhythm: Normal rate.  ?   Heart sounds: Normal heart sounds. No murmur heard. ?Pulmonary:  ?   Effort: Pulmonary effort is normal.  ?   Breath sounds: Normal breath sounds. No wheezing or rales.  ?Chest:  ?   Chest wall: No tenderness.  ?Abdominal:  ?   General: Bowel sounds are normal. There is no distension.  ?   Palpations: Abdomen is soft. There is no mass.  ?   Tenderness:  There is no abdominal tenderness.  ?Genitourinary: ?   Comments: External genitalia-normal ?Vagina-yellowish discharge ?Cervix-normal ?No CMT ?Musculoskeletal:     ?   General: Normal range of motion.  ?   Right lower leg: No edema.  ?   Left lower leg: No edema.  ?Neurological:  ?   Mental Status: He is alert and oriented to person, place, and time.  ?Psychiatric:     ?   Mood and Affect: Mood normal.  ? ? ? ?  Latest Ref Rng & Units 08/12/2021  ? 10:11 AM 05/08/2021  ?  9:24 AM 08/01/2020  ? 10:33 AM  ?CMP  ?Glucose 70 - 99 mg/dL 100   179   228    ?BUN 6 - 20 mg/dL 9   7   7     ?Creatinine 0.57 - 1.00 mg/dL 0.85   0.76   0.65    ?Sodium 134 - 144 mmol/L 139   137   137    ?Potassium 3.5 - 5.2 mmol/L 4.6   4.5   4.5    ?Chloride 96 - 106 mmol/L 103   99   101    ?CO2 20 - 29 mmol/L 24   23   22     ?Calcium 8.7 - 10.2 mg/dL 9.0   8.9   9.0    ?Total Protein 6.0 - 8.5 g/dL 7.5    7.1    ?Total Bilirubin 0.0 - 1.2 mg/dL 0.4    0.2    ?Alkaline Phos 44 - 121 IU/L 104    125    ?AST 0 - 40 IU/L 22    14    ?ALT 0 - 32 IU/L 20    20    ? ? ?Lipid Panel  ?   ?Component Value Date/Time  ? CHOL 157 08/12/2021 1011  ? TRIG 103 08/12/2021 1011  ? HDL 29 (L) 08/12/2021 1011  ? CHOLHDL 4.2 08/01/2020 1033  ? LDLCALC 109 (H) 08/12/2021 1011  ? ? ?CBC ?   ?Component Value Date/Time  ? WBC 10.6 (H) 07/21/2019 0507  ? RBC 4.79 07/21/2019 0507  ? HGB 11.3 (L) 07/21/2019 0507  ? HCT 36.4 07/21/2019 0507  ? PLT 415 (H) 07/21/2019 0507  ? MCV 76.0 (L) 07/21/2019 0507  ? MCH 23.6 (L) 07/21/2019 0507  ? MCHC 31.0 07/21/2019 0507  ?  RDW 15.3 07/21/2019 0507  ? LYMPHSABS 2.7 06/07/2019 1920  ? MONOABS 0.6 06/07/2019 1920  ? EOSABS 0.1 06/07/2019 1920  ? BASOSABS 0.0 06/07/2019 1920  ? ? ?Lab Results  ?Component Value Date  ? HGBA1C 8.0 (A) 08/12/2021  ? ? ?Assessme

## 2021-09-12 NOTE — Patient Instructions (Signed)
Pap Test °Why am I having this test? °A Pap test, also called a Pap smear, is a screening test to check for signs of: °Infection. °Cancer of the cervix. The cervix is the lower part of the uterus that opens into the vagina. °Changes that may be a sign that cancer is developing (precancerous changes). °Women need this test on a regular basis. In general, you should have a Pap test every 3 years until you reach menopause or age 30. Women aged 30-60 may choose to have their Pap test done at the same time as an HPV (human papillomavirus) test every 5 years (instead of every 3 years). °Your health care provider may recommend having Pap tests more or less often depending on your medical conditions and past Pap test results. °What is being tested? °Cervical cells are tested for signs of infection or abnormalities. °What kind of sample is taken? °Your health care provider will collect a sample of cells from the surface of your cervix. This will be done using a small cotton swab, plastic spatula, or brush that is inserted into your vagina using a tool called a speculum. This sample is often collected during a pelvic exam, when you are lying on your back on an exam table with your feet in footrests (stirrups). In some cases, fluids (secretions) from the cervix or vagina may also be collected. °How do I prepare for this test? °Be aware of where you are in your menstrual cycle. If you are menstruating on the day of the test, you may be asked to reschedule. °You may need to reschedule if you have a known vaginal infection on the day of the test. °Follow instructions from your health care provider about: °Changing or stopping your regular medicines. Some medicines can cause abnormal test results, such as vaginal medicines and tetracycline. °Avoiding douching 2-3 days before or the day of the test. °Tell a health care provider about: °Any allergies you have. °All medicines you are taking, including vitamins, herbs, eye drops,  creams, and over-the-counter medicines. °Any bleeding problems you have. °Any surgeries you have had. °Any medical conditions you have. °Whether you are pregnant or may be pregnant. °How are the results reported? °Your test results will be reported as either abnormal or normal. °What do the results mean? °A normal test result means that you do not have signs of cancer of the cervix. °An abnormal result may mean that you have: °Cancer. A Pap test by itself is not enough to diagnose cancer. You will have more tests done if cancer is suspected. °Precancerous changes in your cervix. °Inflammation of the cervix. °An STI (sexually transmitted infection). °A fungal infection. °A parasite infection. °Talk with your health care provider about what your results mean. In some cases, your health care provider may do more testing to confirm the results. °Questions to ask your health care provider °Ask your health care provider, or the department that is doing the test: °When will my results be ready? °How will I get my results? °What are my treatment options? °What other tests do I need? °What are my next steps? °Summary °In general, women should have a Pap test every 3 years until they reach menopause or age 30. °Your health care provider will collect a sample of cells from the surface of your cervix. This will be done using a small cotton swab, plastic spatula, or brush. °In some cases, fluids (secretions) from the cervix or vagina may also be collected. °This information is not intended   to replace advice given to you by your health care provider. Make sure you discuss any questions you have with your health care provider. °Document Revised: 08/24/2020 Document Reviewed: 08/24/2020 °Elsevier Patient Education © 2022 [employer] ° °

## 2021-09-13 ENCOUNTER — Other Ambulatory Visit (HOSPITAL_COMMUNITY): Payer: Self-pay

## 2021-09-13 LAB — CERVICOVAGINAL ANCILLARY ONLY
Bacterial Vaginitis (gardnerella): NEGATIVE
Candida Glabrata: NEGATIVE
Candida Vaginitis: NEGATIVE
Chlamydia: NEGATIVE
Comment: NEGATIVE
Comment: NEGATIVE
Comment: NEGATIVE
Comment: NEGATIVE
Comment: NEGATIVE
Comment: NORMAL
Neisseria Gonorrhea: NEGATIVE
Trichomonas: POSITIVE — AB

## 2021-09-14 ENCOUNTER — Other Ambulatory Visit: Payer: Self-pay | Admitting: Family Medicine

## 2021-09-14 MED ORDER — METRONIDAZOLE 500 MG PO TABS
2000.0000 mg | ORAL_TABLET | Freq: Once | ORAL | 0 refills | Status: AC
  Filled 2021-09-14: qty 4, 1d supply, fill #0

## 2021-09-16 ENCOUNTER — Other Ambulatory Visit: Payer: Self-pay

## 2021-09-17 ENCOUNTER — Other Ambulatory Visit (HOSPITAL_COMMUNITY): Payer: Self-pay

## 2021-09-17 LAB — CYTOLOGY - PAP
Comment: NEGATIVE
Diagnosis: NEGATIVE
Diagnosis: REACTIVE
High risk HPV: NEGATIVE

## 2021-09-17 MED ORDER — "NEEDLE (DISP) 25G X 5/8"" MISC": 0 refills | Status: DC

## 2021-09-17 MED ORDER — "NEEDLE (DISP) 18G X 1"" MISC": 0 refills | Status: DC

## 2021-09-17 MED ORDER — BD SYRINGE LUER-LOK 1 ML MISC: 0 refills | Status: DC

## 2021-09-17 MED ORDER — TESTOSTERONE CYPIONATE 200 MG/ML IM SOLN
100.0000 mg | INTRAMUSCULAR | 0 refills | Status: DC
Start: 2021-09-17 — End: 2022-01-06
  Filled 2021-10-11: qty 6, 42d supply, fill #0

## 2021-09-18 ENCOUNTER — Other Ambulatory Visit: Payer: Self-pay

## 2021-10-11 ENCOUNTER — Other Ambulatory Visit: Payer: Self-pay

## 2021-10-11 ENCOUNTER — Other Ambulatory Visit (HOSPITAL_COMMUNITY): Payer: Self-pay

## 2021-10-18 ENCOUNTER — Other Ambulatory Visit: Payer: Self-pay

## 2021-10-23 ENCOUNTER — Other Ambulatory Visit: Payer: Self-pay

## 2021-11-01 ENCOUNTER — Other Ambulatory Visit: Payer: Self-pay

## 2021-11-08 ENCOUNTER — Other Ambulatory Visit: Payer: Self-pay

## 2021-11-12 ENCOUNTER — Ambulatory Visit: Payer: 59 | Attending: Family Medicine | Admitting: Family Medicine

## 2021-11-12 ENCOUNTER — Encounter: Payer: Self-pay | Admitting: Family Medicine

## 2021-11-12 ENCOUNTER — Other Ambulatory Visit: Payer: Self-pay

## 2021-11-12 VITALS — BP 132/82 | HR 78 | Ht 65.0 in | Wt 316.2 lb

## 2021-11-12 DIAGNOSIS — Z6841 Body Mass Index (BMI) 40.0 and over, adult: Secondary | ICD-10-CM

## 2021-11-12 DIAGNOSIS — E1165 Type 2 diabetes mellitus with hyperglycemia: Secondary | ICD-10-CM

## 2021-11-12 DIAGNOSIS — Z794 Long term (current) use of insulin: Secondary | ICD-10-CM

## 2021-11-12 LAB — POCT GLYCOSYLATED HEMOGLOBIN (HGB A1C): HbA1c, POC (controlled diabetic range): 6.5 % (ref 0.0–7.0)

## 2021-11-12 LAB — GLUCOSE, POCT (MANUAL RESULT ENTRY): POC Glucose: 119 mg/dl — AB (ref 70–99)

## 2021-11-12 MED ORDER — GLIPIZIDE ER 10 MG PO TB24
10.0000 mg | ORAL_TABLET | Freq: Every day | ORAL | 0 refills | Status: DC
  Filled 2021-11-12: qty 30, 30d supply, fill #0
  Filled 2021-12-18: qty 30, 30d supply, fill #1
  Filled 2022-01-20: qty 30, 30d supply, fill #2

## 2021-11-12 MED ORDER — TIRZEPATIDE 5 MG/0.5ML ~~LOC~~ SOAJ
5.0000 mg | SUBCUTANEOUS | 6 refills | Status: DC
  Filled 2021-11-12: qty 2, 28d supply, fill #0
  Filled 2021-12-11: qty 2, 28d supply, fill #1
  Filled 2021-12-31: qty 2, 28d supply, fill #2
  Filled 2022-02-02: qty 2, 28d supply, fill #3

## 2021-11-12 MED ORDER — DAPAGLIFLOZIN PROPANEDIOL 10 MG PO TABS
10.0000 mg | ORAL_TABLET | Freq: Every day | ORAL | 1 refills | Status: DC
  Filled 2021-11-12: qty 30, 30d supply, fill #0
  Filled 2021-12-18: qty 30, 30d supply, fill #1
  Filled 2022-01-20: qty 30, 30d supply, fill #2
  Filled 2022-02-17: qty 30, 30d supply, fill #3
  Filled 2022-03-20: qty 30, 30d supply, fill #4
  Filled 2022-04-24: qty 30, 30d supply, fill #5

## 2021-11-12 NOTE — Patient Instructions (Signed)

## 2021-11-12 NOTE — Progress Notes (Signed)
Subjective:  Patient ID: Diana Joyce, adult    DOB: September 14, 1990  Age: 31 y.o. MRN: [id number]  CC: Diabetes   HPI Diana Joyce is a 31 y.o. year old adult with a history of type 2 diabetes mellitus (A1c 8.0), asthma, morbid obesity, on testosterone therapy from [facility] for gender transition (female to female)  Interval History: He has had some hypoglycemia of up to about 60 around 10-11 pm while at work.  He administers Lantus at around 5 PM and is also on Mounjaro and glipizide. Ever since initiating Mounjaro he has noticed a decreased appetite. A1c 6.5 down from 8.0 previously. Today he will be beginning a regular exercise regimen at the gym; previously walked for exercise  He has not needed her Humalog in a while He has not smoked in 2 days. Past Medical History:  Diagnosis Date   Apnea, sleep    Asthma    Diabetes ([place])     Past Surgical History:  Procedure Laterality Date   FRACTURE SURGERY      Family History  Problem Relation Age of Onset   Kidney failure Mother    Hypertension Mother    Diabetes Mother    Heart failure Mother    Diabetes Father     Social History   Socioeconomic History   Marital status: Single    Spouse name: Not on file   Number of children: Not on file   Years of education: Not on file   Highest education level: Not on file  Occupational History   Not on file  Tobacco Use   Smoking status: Every Day    Types: Cigars   Smokeless tobacco: Never  Vaping Use   Vaping Use: Never used  Substance and Sexual Activity   Alcohol use: Yes   Drug use: No   Sexual activity: Yes    Birth control/protection: None  Other Topics Concern   Not on file  Social History Narrative   Not on file   Social Determinants of Health   Financial Resource Strain: Not on file  Food Insecurity: Not on file  Transportation Needs: Not on file  Physical Activity: Not on file  Stress: Not on file  Social Connections: Not on file     Allergies  Allergen Reactions   Benadryl [Diphenhydramine Hcl] Other (See Comments)    Hives, coughing, itching.     Outpatient Medications Prior to Visit  Medication Sig Dispense Refill   albuterol (VENTOLIN HFA) 108 (90 Base) MCG/ACT inhaler Inhale 2 puffs into the lungs every 6 (six) hours as needed for wheezing or shortness of breath. 18 g 2   blood glucose meter kit and supplies KIT Dispense with test strips and lancets based on insurance preference. Use up to four times daily as directed. (FOR ICD-9 250.00, 250.01). 100 each 12   Blood Glucose Monitoring Suppl (TRUE METRIX METER) w/Device KIT Check blood sugars fasting and at bedtime daily 1 kit 1   glucose blood (TRUE METRIX BLOOD GLUCOSE TEST) test strip Use as instructed 100 each 12   insulin glargine (LANTUS) 100 UNIT/ML injection Inject 0.5 mLs (50 Units total) into the skin daily. 190 mL 3   insulin lispro (HUMALOG) 100 UNIT/ML KwikPen INJECT 0-12 UNITS INTO THE SKIN 3 (THREE) TIMES DAILY. ACCORDING TO SLIDING SCALE 30 mL 6   Insulin Pen Needle (PEN NEEDLES) 32G X 4 MM MISC 1 Dose by Does not apply route daily. 100 each 1   NEEDLE, DISP, 18 G (  EASY TOUCH FLIPLOCK NEEDLES) 18G X 1" MISC use as directed 20 each 0   NEEDLE, DISP, 18 G 18G X 1" MISC Use as directed 20 each 0   NEEDLE, DISP, 18 G 18G X 1" MISC See admin instructions. 20 each 0   NEEDLE, DISP, 18 G 18G X 1" MISC Use as directed 20 each 0   NEEDLE, DISP, 25 G (BD DISP NEEDLES) 25G X 5/8" MISC See admin instructions. 20 each 0   NEEDLE, DISP, 25 G (EASY TOUCH FLIPLOCK NEEDLES) 25G X 5/8" MISC use as directed 20 each 0   NEEDLE, DISP, 25 G 25G X 5/8" MISC Use as directed 20 each 0   NEEDLE, DISP, 25 G 25G X 5/8" MISC Use as directed. 20 each 0   Syringe, Disposable, (B-D SYRINGE LUER-LOK 1CC) 1 ML MISC use as directed 20 each 0   Syringe, Disposable, (B-D SYRINGE LUER-LOK 1CC) 1 ML MISC use as directed 20 each 0   Syringe, Disposable, (B-D SYRINGE LUER-LOK 1CC) 1 ML  MISC use as directed 20 each 0   Syringe, Disposable, (BD PLASTIPAK SYRINGE) 3 ML MISC See admin instructions. 20 each 0   testosterone cypionate (DEPOTESTOSTERONE CYPIONATE) 200 MG/ML injection Inject 0.5 mLs (100 mg total) into the skin once a week. 6 mL 0   TRUEplus Lancets 28G MISC Use as directed 3 (three) times daily before meals. 100 each 12   dapagliflozin propanediol (FARXIGA) 10 MG TABS tablet Take 1 tablet (10 mg total) by mouth daily before breakfast. 90 tablet 1   glipiZIDE (GLIPIZIDE XL) 10 MG 24 hr tablet Take 2 tablets (20 mg total) by mouth daily with breakfast. 180 tablet 1   tirzepatide (MOUNJARO) 2.5 MG/0.5ML Pen Inject 2.5 mg into the skin once a week for 28 days. 2 mL 3   aluminum chloride (DRYSOL) 20 % external solution Apply topically at bedtime. (Patient not taking: Reported on 08/01/2020) 35 mL 1   Norgestimate-Ethinyl Estradiol Triphasic (ORTHO TRI-CYCLEN LO) 0.18/0.215/0.25 MG-25 MCG tab Take 1 tablet by mouth daily. (Patient not taking: Reported on 10/12/2018) 1 Package 11   nystatin (MYCOSTATIN/NYSTOP) powder Apply 1 application topically 3 (three) times daily. (Patient not taking: Reported on 10/29/2020) 60 g 1   No facility-administered medications prior to visit.     ROS Review of Systems  Constitutional:  Negative for activity change, appetite change and fatigue.  HENT:  Negative for congestion, sinus pressure and sore throat.   Eyes:  Negative for visual disturbance.  Respiratory:  Negative for cough, chest tightness, shortness of breath and wheezing.   Cardiovascular:  Negative for chest pain and palpitations.  Gastrointestinal:  Negative for abdominal distention, abdominal pain and constipation.  Endocrine: Negative for polydipsia.  Genitourinary:  Negative for dysuria and frequency.  Musculoskeletal:  Negative for arthralgias and back pain.  Skin:  Negative for rash.  Neurological:  Negative for tremors, light-headedness and numbness.  Hematological:   Does not bruise/bleed easily.  Psychiatric/Behavioral:  Negative for agitation and behavioral problems.    Objective:  BP 132/82   Pulse 78   Ht 5' 5"  (1.651 m)   Wt (!) 316 lb 3.2 oz (143.4 kg)   SpO2 98%   BMI 52.62 kg/m      11/12/2021    9:24 AM 09/12/2021    9:50 AM 08/12/2021   10:03 AM  BP/Weight  Systolic BP 638 466 599  Diastolic BP 82 72 90  Wt. (Lbs) 316.2 307.4   BMI 52.62 kg/m2  51.15 kg/m2       Physical Exam Constitutional:      Appearance: He is well-developed. He is obese.  Cardiovascular:     Rate and Rhythm: Normal rate.     Heart sounds: Normal heart sounds. No murmur heard. Pulmonary:     Effort: Pulmonary effort is normal.     Breath sounds: Normal breath sounds. No wheezing or rales.  Chest:     Chest wall: No tenderness.  Abdominal:     General: Bowel sounds are normal. There is no distension.     Palpations: Abdomen is soft. There is no mass.     Tenderness: There is no abdominal tenderness.  Musculoskeletal:        General: Normal range of motion.     Right lower leg: No edema.     Left lower leg: No edema.  Neurological:     Mental Status: He is alert and oriented to person, place, and time.  Psychiatric:        Mood and Affect: Mood normal.       Latest Ref Rng & Units 08/12/2021   10:11 AM 05/08/2021    9:24 AM 08/01/2020   10:33 AM  CMP  Glucose 70 - 99 mg/dL 100   179   228    BUN 6 - 20 mg/dL 9   7   7     Creatinine 0.57 - 1.00 mg/dL 0.85   0.76   0.65    Sodium 134 - 144 mmol/L 139   137   137    Potassium 3.5 - 5.2 mmol/L 4.6   4.5   4.5    Chloride 96 - 106 mmol/L 103   99   101    CO2 20 - 29 mmol/L 24   23   22     Calcium 8.7 - 10.2 mg/dL 9.0   8.9   9.0    Total Protein 6.0 - 8.5 g/dL 7.5    7.1    Total Bilirubin 0.0 - 1.2 mg/dL 0.4    0.2    Alkaline Phos 44 - 121 IU/L 104    125    AST 0 - 40 IU/L 22    14    ALT 0 - 32 IU/L 20    20      Lipid Panel     Component Value Date/Time   CHOL 157 08/12/2021 1011    TRIG 103 08/12/2021 1011   HDL 29 (L) 08/12/2021 1011   CHOLHDL 4.2 08/01/2020 1033   LDLCALC 109 (H) 08/12/2021 1011    CBC    Component Value Date/Time   WBC 10.6 (H) 07/21/2019 0507   RBC 4.79 07/21/2019 0507   HGB 11.3 (L) 07/21/2019 0507   HCT 36.4 07/21/2019 0507   PLT 415 (H) 07/21/2019 0507   MCV 76.0 (L) 07/21/2019 0507   MCH 23.6 (L) 07/21/2019 0507   MCHC 31.0 07/21/2019 0507   RDW 15.3 07/21/2019 0507   LYMPHSABS 2.7 06/07/2019 1920   MONOABS 0.6 06/07/2019 1920   EOSABS 0.1 06/07/2019 1920   BASOSABS 0.0 06/07/2019 1920    Lab Results  Component Value Date   HGBA1C 6.5 11/12/2021    Assessment & Plan:  1. Type 2 diabetes mellitus with hyperglycemia, with long-term current use of insulin (HCC) Controlled with A1c of 6.5 down from 8.0 previously He has been commended on improvement Due to hypoglycemic episodes I have decreased dose of glipizide from 20 mg to 10  mg daily Increase dose of Mounjaro for additional weight loss benefit Advised to decrease Lantus by 2 units if hypoglycemia persists - POCT glucose (manual entry) - POCT glycosylated hemoglobin (Hb A1C) - glipiZIDE (GLIPIZIDE XL) 10 MG 24 hr tablet; Take 1 tablet (10 mg total) by mouth daily with breakfast.  Dispense: 90 tablet; Refill: 0 - dapagliflozin propanediol (FARXIGA) 10 MG TABS tablet; Take 1 tablet (10 mg total) by mouth daily before breakfast.  Dispense: 90 tablet; Refill: 1  2. Morbid obesity ([place]) Counseled on increasing physical activity, reducing portion sizes Previous weight loss counteracted by recent increase in testosterone by [facility] - tirzepatide [facility]) 5 MG/0.5ML Pen; Inject 5 mg into the skin once a week.  Dispense: 6 mL; Refill: 6    Meds ordered this encounter  Medications   tirzepatide (MOUNJARO) 5 MG/0.5ML Pen    Sig: Inject 5 mg into the skin once a week.    Dispense:  6 mL    Refill:  6    Dose increase   glipiZIDE (GLIPIZIDE XL) 10 MG 24 hr tablet     Sig: Take 1 tablet (10 mg total) by mouth daily with breakfast.    Dispense:  90 tablet    Refill:  0    Dose decrease   dapagliflozin propanediol (FARXIGA) 10 MG TABS tablet    Sig: Take 1 tablet (10 mg total) by mouth daily before breakfast.    Dispense:  90 tablet    Refill:  1    Follow-up: Return in about 3 months (around 02/12/2022) for Chronic medical conditions.       Charlott Rakes, MD, FAAFP. [facility] and [place], [place]   11/12/2021, 11:08 AM

## 2021-11-22 ENCOUNTER — Other Ambulatory Visit: Payer: Self-pay

## 2021-12-11 ENCOUNTER — Other Ambulatory Visit: Payer: Self-pay

## 2021-12-13 ENCOUNTER — Other Ambulatory Visit: Payer: Self-pay

## 2021-12-18 ENCOUNTER — Other Ambulatory Visit: Payer: Self-pay

## 2021-12-20 ENCOUNTER — Other Ambulatory Visit: Payer: Self-pay

## 2021-12-31 ENCOUNTER — Other Ambulatory Visit (HOSPITAL_COMMUNITY): Payer: Self-pay

## 2022-01-01 ENCOUNTER — Other Ambulatory Visit: Payer: Self-pay

## 2022-01-03 ENCOUNTER — Other Ambulatory Visit (HOSPITAL_COMMUNITY): Payer: Self-pay

## 2022-01-06 ENCOUNTER — Other Ambulatory Visit (HOSPITAL_COMMUNITY): Payer: Self-pay

## 2022-01-06 MED ORDER — TESTOSTERONE CYPIONATE 200 MG/ML IM SOLN
100.0000 mg | INTRAMUSCULAR | 0 refills | Status: DC
Start: 2022-01-06 — End: 2022-04-01
  Filled 2022-01-06: qty 6, 42d supply, fill #0

## 2022-01-07 ENCOUNTER — Other Ambulatory Visit (HOSPITAL_COMMUNITY): Payer: Self-pay

## 2022-01-20 ENCOUNTER — Other Ambulatory Visit: Payer: Self-pay

## 2022-01-24 ENCOUNTER — Other Ambulatory Visit: Payer: Self-pay

## 2022-02-03 ENCOUNTER — Other Ambulatory Visit: Payer: Self-pay

## 2022-02-07 ENCOUNTER — Other Ambulatory Visit: Payer: Self-pay

## 2022-02-14 ENCOUNTER — Other Ambulatory Visit: Payer: Self-pay

## 2022-02-17 ENCOUNTER — Other Ambulatory Visit: Payer: Self-pay

## 2022-02-17 ENCOUNTER — Encounter: Payer: Self-pay | Admitting: Family Medicine

## 2022-02-17 ENCOUNTER — Ambulatory Visit: Payer: 59 | Attending: Family Medicine | Admitting: Family Medicine

## 2022-02-17 ENCOUNTER — Ambulatory Visit
Admission: RE | Admit: 2022-02-17 | Discharge: 2022-02-17 | Disposition: A | Discharge status: Home / Self Care | Payer: 59 | Source: Ambulatory Visit | Attending: Family Medicine | Admitting: Family Medicine

## 2022-02-17 VITALS — BP 137/79 | HR 98 | Ht 65.25 in | Wt 307.2 lb

## 2022-02-17 DIAGNOSIS — M25512 Pain in left shoulder: Secondary | ICD-10-CM

## 2022-02-17 DIAGNOSIS — Z794 Long term (current) use of insulin: Secondary | ICD-10-CM

## 2022-02-17 DIAGNOSIS — Z23 Encounter for immunization: Secondary | ICD-10-CM | POA: Diagnosis not present

## 2022-02-17 DIAGNOSIS — E1165 Type 2 diabetes mellitus with hyperglycemia: Secondary | ICD-10-CM

## 2022-02-17 DIAGNOSIS — Z6841 Body Mass Index (BMI) 40.0 and over, adult: Secondary | ICD-10-CM

## 2022-02-17 DIAGNOSIS — G8929 Other chronic pain: Secondary | ICD-10-CM | POA: Diagnosis not present

## 2022-02-17 DIAGNOSIS — F17290 Nicotine dependence, other tobacco product, uncomplicated: Secondary | ICD-10-CM | POA: Diagnosis not present

## 2022-02-17 DIAGNOSIS — Z720 Tobacco use: Secondary | ICD-10-CM

## 2022-02-17 LAB — GLUCOSE, POCT (MANUAL RESULT ENTRY): POC Glucose: 155 mg/dl — AB (ref 70–99)

## 2022-02-17 LAB — POCT GLYCOSYLATED HEMOGLOBIN (HGB A1C): HbA1c, POC (controlled diabetic range): 6.2 % (ref 0.0–7.0)

## 2022-02-17 MED ORDER — TIRZEPATIDE 7.5 MG/0.5ML ~~LOC~~ SOAJ
7.5000 mg | SUBCUTANEOUS | 0 refills | Status: DC
  Filled 2022-02-17: qty 2, 28d supply, fill #0
  Filled 2022-03-10: qty 4, 56d supply, fill #1

## 2022-02-17 MED ORDER — TIRZEPATIDE 7.5 MG/0.5ML ~~LOC~~ SOAJ
7.5000 mg | SUBCUTANEOUS | 0 refills | Status: DC
  Filled 2022-02-17: qty 6, 84d supply, fill #0

## 2022-02-17 MED ORDER — TIRZEPATIDE 10 MG/0.5ML ~~LOC~~ SOAJ
10.0000 mg | SUBCUTANEOUS | 6 refills | Status: DC
  Filled 2022-02-17 – 2022-03-20 (×4): qty 6, 84d supply, fill #0
  Filled 2022-03-21: qty 2, 28d supply, fill #0
  Filled 2022-04-12: qty 2, 28d supply, fill #1
  Filled 2022-05-15: qty 2, 28d supply, fill #2
  Filled 2022-06-03 – 2022-06-12 (×3): qty 2, 28d supply, fill #3
  Filled 2022-07-01 – 2022-07-11 (×3): qty 2, 28d supply, fill #4
  Filled 2022-08-11: qty 2, 28d supply, fill #5
  Filled 2022-08-28 – 2022-09-01 (×2): qty 2, 28d supply, fill #6

## 2022-02-17 NOTE — Progress Notes (Signed)
Subjective:  Patient ID: Diana Joyce, adult    DOB: 1991/06/04  Age: 31 y.o. MRN: [id number]  CC: No chief complaint on file.   HPI Diana Joyce is a 30 y.o. year old adult with a history of type 2 diabetes mellitus (A1c 6.2), asthma, morbid obesity, on testosterone therapy from [facility] for gender transition (female to female)  Interval History: A1c is 6.2 down from 6.5 previously and he reports doing well on [facility] and has had no hypoglycemic episodes. He is unable to exercise much due to his hours at work.  Endorses presence of reduced appetite and only eats about once a day. Continues to smoke and is not ready to quit as smoking at has been a coping mechanism for him given his girlfriend is currently on hemodialysis.  He has to pop his shoulder 3-4 times/day as it hurts and he has pain and feels like he has to move it around. 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  Denies presence of numbness, tingling or reduced strength. It hurts more after sleeping on it  Past Medical History:  Diagnosis Date   Apnea, sleep    Asthma    Diabetes ([place])     Past Surgical History:  Procedure Laterality Date   FRACTURE SURGERY      Family History  Problem Relation Age of Onset   Kidney failure Mother    Hypertension Mother    Diabetes Mother    Heart failure Mother    Diabetes Father     Social History   Socioeconomic History   Marital status: Single    Spouse name: Not on file   Number of children: Not on file   Years of education: Not on file   Highest education level: Not on file  Occupational History   Not on file  Tobacco Use   Smoking status: Every Day    Types: Cigars   Smokeless tobacco: Never  Vaping Use   Vaping Use: Never used  Substance and Sexual Activity   Alcohol use: Yes   Drug use: No   Sexual activity: Yes    Birth control/protection: None  Other Topics Concern   Not on file  Social History  Narrative   Not on file   Social Determinants of Health   Financial Resource Strain: Not on file  Food Insecurity: Not on file  Transportation Needs: Not on file  Physical Activity: Not on file  Stress: Not on file  Social Connections: Not on file    Allergies  Allergen Reactions   Benadryl [Diphenhydramine Hcl] Other (See Comments)    Hives, coughing, itching.     Outpatient Medications Prior to Visit  Medication Sig Dispense Refill   aluminum chloride (DRYSOL) 20 % external solution Apply topically at bedtime. 35 mL 1   blood glucose meter kit and supplies KIT Dispense with test strips and lancets based on insurance preference. Use up to four times daily as directed. (FOR ICD-9 250.00, 250.01). 100 each 12   Blood Glucose Monitoring Suppl (TRUE METRIX METER) w/Device KIT Check blood sugars fasting and at bedtime daily 1 kit 1   dapagliflozin propanediol (FARXIGA) 10 MG TABS tablet Take 1 tablet (10 mg total) by mouth daily before breakfast. 90 tablet 1   glucose blood (TRUE METRIX BLOOD GLUCOSE TEST) test strip Use as instructed 100 each 12   insulin glargine (LANTUS) 100 UNIT/ML injection Inject 0.5 mLs (50 Units total) into  the skin daily. 190 mL 3   insulin lispro (HUMALOG) 100 UNIT/ML KwikPen INJECT 0-12 UNITS INTO THE SKIN 3 (THREE) TIMES DAILY. ACCORDING TO SLIDING SCALE 30 mL 6   Insulin Pen Needle (PEN NEEDLES) 32G X 4 MM MISC 1 Dose by Does not apply route daily. 100 each 1   NEEDLE, DISP, 18 G (EASY TOUCH FLIPLOCK NEEDLES) 18G X 1" MISC use as directed 20 each 0   NEEDLE, DISP, 18 G 18G X 1" MISC Use as directed 20 each 0   NEEDLE, DISP, 25 G (BD DISP NEEDLES) 25G X 5/8" MISC See admin instructions. 20 each 0   NEEDLE, DISP, 25 G (EASY TOUCH FLIPLOCK NEEDLES) 25G X 5/8" MISC use as directed 20 each 0   NEEDLE, DISP, 25 G 25G X 5/8" MISC Use as directed. 20 each 0   Syringe, Disposable, (B-D SYRINGE LUER-LOK 1CC) 1 ML MISC use as directed 20 each 0   Syringe, Disposable,  (B-D SYRINGE LUER-LOK 1CC) 1 ML MISC use as directed 20 each 0   Syringe, Disposable, (B-D SYRINGE LUER-LOK 1CC) 1 ML MISC use as directed 20 each 0   Syringe, Disposable, (BD PLASTIPAK SYRINGE) 3 ML MISC See admin instructions. 20 each 0   testosterone cypionate (DEPOTESTOSTERONE CYPIONATE) 200 MG/ML injection Inject 0.5 mLs (100 mg total) into the skin once a week. 6 mL 0   TRUEplus Lancets 28G MISC Use as directed 3 (three) times daily before meals. 100 each 12   albuterol (VENTOLIN HFA) 108 (90 Base) MCG/ACT inhaler Inhale 2 puffs into the lungs every 6 (six) hours as needed for wheezing or shortness of breath. 18 g 2   glipiZIDE (GLIPIZIDE XL) 10 MG 24 hr tablet Take 1 tablet (10 mg total) by mouth daily with breakfast. 90 tablet 0   NEEDLE, DISP, 18 G 18G X 1" MISC Use as directed 20 each 0   NEEDLE, DISP, 18 G 18G X 1" MISC See admin instructions. 20 each 0   NEEDLE, DISP, 25 G 25G X 5/8" MISC Use as directed 20 each 0   tirzepatide (MOUNJARO) 5 MG/0.5ML Pen Inject 5 mg into the skin once a week. 6 mL 6   Norgestimate-Ethinyl Estradiol Triphasic (ORTHO TRI-CYCLEN LO) 0.18/0.215/0.25 MG-25 MCG tab Take 1 tablet by mouth daily. (Patient not taking: Reported on 02/17/2022) 1 Package 11   nystatin (MYCOSTATIN/NYSTOP) powder Apply 1 application topically 3 (three) times daily. (Patient not taking: Reported on 02/17/2022) 60 g 1   No facility-administered medications prior to visit.     ROS Review of Systems  Constitutional:  Negative for activity change, appetite change and fatigue.  HENT:  Negative for congestion, sinus pressure and sore throat.   Eyes:  Negative for visual disturbance.  Respiratory:  Negative for cough, chest tightness, shortness of breath and wheezing.   Cardiovascular:  Negative for chest pain and palpitations.  Gastrointestinal:  Negative for abdominal distention, abdominal pain and constipation.  Endocrine: Negative for polydipsia.  Genitourinary:  Negative for  dysuria and frequency.  Musculoskeletal:        See HPI  Skin:  Negative for rash.  Neurological:  Negative for tremors, light-headedness and numbness.  Hematological:  Does not bruise/bleed easily.  Psychiatric/Behavioral:  Negative for agitation and behavioral problems.     Objective:  BP 137/79   Pulse 98   Ht 5' 5.25" (1.657 m)   Wt (!) 307 lb 3.2 oz (139.3 kg)   SpO2 98%   BMI 50.73 kg/m  02/17/2022   11:04 AM 11/12/2021    9:24 AM 09/12/2021    9:50 AM  BP/Weight  Systolic BP 161 096 045  Diastolic BP 79 82 72  Wt. (Lbs) 307.2 316.2 307.4  BMI 50.73 kg/m2 52.62 kg/m2 51.15 kg/m2      Physical Exam Constitutional:      Appearance: He is well-developed.  Cardiovascular:     Rate and Rhythm: Normal rate.     Heart sounds: Normal heart sounds. No murmur heard. Pulmonary:     Effort: Pulmonary effort is normal.     Breath sounds: Normal breath sounds. No wheezing or rales.  Chest:     Chest wall: No tenderness.  Abdominal:     General: Bowel sounds are normal. There is no distension.     Palpations: Abdomen is soft. There is no mass.     Tenderness: There is no abdominal tenderness.  Musculoskeletal:        General: Normal range of motion.     Right lower leg: No edema.     Left lower leg: No edema.  Neurological:     Mental Status: He is alert and oriented to person, place, and time.  Psychiatric:        Mood and Affect: Mood normal.        Latest Ref Rng & Units 08/12/2021   10:11 AM 05/08/2021    9:24 AM 08/01/2020   10:33 AM  CMP  Glucose 70 - 99 mg/dL 100  179  228   BUN 6 - 20 mg/dL _0 Creatinine 0.57 - 1.00 mg/dL 0.85  0.76  0.65   Sodium 134 - 144 mmol/L 139  137  137   Potassium 3.5 - 5.2 mmol/L 4.6  4.5  4.5   Chloride 96 - 106 mmol/L 103  99  101   CO2 20 - 29 mmol/L _1 Calcium 8.7 - 10.2 mg/dL 9.0  8.9  9.0   Total Protein 6.0 - 8.5 g/dL 7.5   7.1   Total Bilirubin 0.0 - 1.2 mg/dL 0.4   0.2   Alkaline Phos 44 - 121  IU/L 104   125   AST 0 - 40 IU/L 22   14   ALT 0 - 32 IU/L 20   20     Lipid Panel     Component Value Date/Time   CHOL 157 08/12/2021 1011   TRIG 103 08/12/2021 1011   HDL 29 (L) 08/12/2021 1011   CHOLHDL 4.2 08/01/2020 1033   LDLCALC 109 (H) 08/12/2021 1011    CBC    Component Value Date/Time   WBC 10.6 (H) 07/21/2019 0507   RBC 4.79 07/21/2019 0507   HGB 11.3 (L) 07/21/2019 0507   HCT 36.4 07/21/2019 0507   PLT 415 (H) 07/21/2019 0507   MCV 76.0 (L) 07/21/2019 0507   MCH 23.6 (L) 07/21/2019 0507   MCHC 31.0 07/21/2019 0507   RDW 15.3 07/21/2019 0507   LYMPHSABS 2.7 06/07/2019 1920   MONOABS 0.6 06/07/2019 1920   EOSABS 0.1 06/07/2019 1920   BASOSABS 0.0 06/07/2019 1920    Lab Results  Component Value Date   HGBA1C 6.2 02/17/2022    Assessment & Plan:  1. Type 2 diabetes mellitus with hyperglycemia, with long-term current use of insulin (HCC) Controlled with A1c of 6.2 and he has been commended Discontinue glipizide We will titrate up Mounjaro for additional weight loss benefit Counseled on  Diabetic diet, my plate method, 828 minutes of moderate intensity exercise/week Blood sugar logs with fasting goals of 80-120 mg/dl, random of less than 180 and in the event of sugars less than 60 mg/dl or greater than 400 mg/dl encouraged to notify the clinic. Advised on the need for annual eye exams, annual foot exams, Pneumonia vaccine. - POCT glycosylated hemoglobin (Hb A1C) - POCT glucose (manual entry) - tirzepatide (MOUNJARO) 10 MG/0.5ML Pen; Inject 10 mg into the skin once a week.  Dispense: 6 mL; Refill: 6 - Basic Metabolic Panel - CBC with Differential/Platelet - tirzepatide (MOUNJARO) 7.5 MG/0.5ML Pen; Inject 7.5 mg into the skin once a week. For 4 weeks then increase to 4m  Dispense: 6 mL; Refill: 0  2. 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 Tobacco abuse Spent 3 minutes counseling on cessation but he is not ready to quit at this time  4. Class 3 severe obesity due to excess calories without serious comorbidity with body mass index (BMI) of 50.0 to 59.9 in adult ([place] Commended on 9 pound weight loss Hopefully with increasing dose of Mounjaro we should see an additional weight loss  5. Need for immunization against influenza - Flu Vaccine QUAD 646moM (Fluarix, Fluzone & Alfiuria Quad PF)    Meds ordered this encounter  Medications   tirzepatide (MOUNJARO) 10 MG/0.5ML Pen    Sig: Inject 10 mg into the skin once a week.    Dispense:  6 mL    Refill:  6   DISCONTD: tirzepatide (MOUNJARO) 7.5 MG/0.5ML Pen    Sig: Inject 7.5 mg into the skin once a week.    Dispense:  6 mL    Refill:  0    Discontinue Glipizide   tirzepatide (MOUNJARO) 7.5 MG/0.5ML Pen    Sig: Inject 7.5 mg into the skin once a week. For 4 weeks then increase to 1068m  Dispense:  6 mL    Refill:  0    Discontinue Glipizide    Follow-up: Return in about 6 months (around 08/18/2022) for Chronic medical conditions.       EnoCharlott RakesD, FAAFP. Con[facility] [place]9/04/2022, 12:48 PM

## 2022-02-17 NOTE — Progress Notes (Signed)
Left shoulder discomfort/ soreness. Patient states that she has to pop her shoulder back into place.

## 2022-02-17 NOTE — Patient Instructions (Signed)

## 2022-02-18 ENCOUNTER — Other Ambulatory Visit: Payer: Self-pay

## 2022-02-18 LAB — CBC WITH DIFFERENTIAL/PLATELET
Basophils Absolute: 0 10*3/uL (ref 0.0–0.2)
Basos: 0 %
EOS (ABSOLUTE): 0.1 10*3/uL (ref 0.0–0.4)
Eos: 2 %
Hematocrit: 53 % — ABNORMAL HIGH (ref 34.0–46.6)
Hemoglobin: 16.8 g/dL — ABNORMAL HIGH (ref 11.1–15.9)
Immature Grans (Abs): 0 10*3/uL (ref 0.0–0.1)
Immature Granulocytes: 0 %
Lymphocytes Absolute: 2.9 10*3/uL (ref 0.7–3.1)
Lymphs: 33 %
MCH: 24.3 pg — ABNORMAL LOW (ref 26.6–33.0)
MCHC: 31.7 g/dL (ref 31.5–35.7)
MCV: 77 fL — ABNORMAL LOW (ref 79–97)
Monocytes Absolute: 0.6 10*3/uL (ref 0.1–0.9)
Monocytes: 7 %
Neutrophils Absolute: 5.3 10*3/uL (ref 1.4–7.0)
Neutrophils: 58 %
Platelets: 328 10*3/uL (ref 150–450)
RBC: 6.91 x10E6/uL — ABNORMAL HIGH (ref 3.77–5.28)
RDW: 18.1 % — ABNORMAL HIGH (ref 11.7–15.4)
WBC: 9 10*3/uL (ref 3.4–10.8)

## 2022-02-18 LAB — BASIC METABOLIC PANEL
BUN/Creatinine Ratio: 11 (ref 9–23)
BUN: 8 mg/dL (ref 6–20)
CO2: 22 mmol/L (ref 20–29)
Calcium: 9.1 mg/dL (ref 8.7–10.2)
Chloride: 103 mmol/L (ref 96–106)
Creatinine, Ser: 0.74 mg/dL (ref 0.57–1.00)
Glucose: 109 mg/dL — ABNORMAL HIGH (ref 70–99)
Potassium: 4.6 mmol/L (ref 3.5–5.2)
Sodium: 139 mmol/L (ref 134–144)
eGFR: 112 mL/min/{1.73_m2} (ref >59)

## 2022-02-21 ENCOUNTER — Other Ambulatory Visit: Payer: Self-pay

## 2022-02-24 ENCOUNTER — Encounter: Payer: Self-pay | Admitting: Sports Medicine

## 2022-02-24 ENCOUNTER — Other Ambulatory Visit: Payer: Self-pay

## 2022-02-24 ENCOUNTER — Ambulatory Visit: Payer: 59 | Admitting: Sports Medicine

## 2022-02-24 VITALS — BP 130/88 | HR 94 | Ht 68.0 in | Wt 305.0 lb

## 2022-02-24 DIAGNOSIS — M7502 Adhesive capsulitis of left shoulder: Secondary | ICD-10-CM | POA: Diagnosis not present

## 2022-02-24 NOTE — Progress Notes (Signed)
Left shoulder pain especially in the morning. States it feels like it needs to constantly be popped. A few months of pain. Denies numbness or tingling. No injury to it.  Denies any medication for the pain. 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 X-rays were taken on 9/12 at [facility].

## 2022-02-24 NOTE — Progress Notes (Signed)
Diana Joyce - 31 y.o. adult MRN [id number]  Date of birth: 1990/12/31  Office Visit Note: Visit Date: 02/24/2022 PCP: Charlott Rakes, MD Referred by: Charlott Rakes, MD  Subjective: CC: Left shoulder pain  HPI: Diana Joyce is a pleasant 31 y.o. adult who presents today for chronic left shoulder pain.  Patient states the pain started somewhere from the last 6-12 months.  Denies any specific known injury.  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  Pain is present over the posterior lateral aspect of the shoulder.  She reports limited range of motion and states she feels like the shoulder needs to pop, at times the shoulder will pop when she is taking it through range of motion and her pain will feel better after this time.  She does take ibuprofen which does help improve her pain to some extent.  She notes her pain and stiffness was much worse last year, it has been improving over the last few months.  However given her popping sensation through provocative maneuvers, her PCP decided she should get checked out.  Type II diabetic, with uncontrolled 1 year ago with A1c at 12.1, but has had vastly improved control over last 3 months, A1c of 6.2 on 02/17/22.  Pertinent ROS were reviewed with the patient and found to be negative unless otherwise specified above in HPI.   Assessment & Plan: Visit Diagnoses:  1. Adhesive capsulitis of left shoulder    Plan: Discussed with Diana Joyce today that I believe they are experiencing the effects of adhesive capsulitis, frozen shoulder, of the left shoulder.  Symptoms and exam are indicative of this, and review upon patient's hemoglobin A1c shows a significantly uncontrolled glucose levels at the time of initial pain and stiffness presentation.  Pain and inflammatory stage has passed, although still has some limitation in active and passive ROM.  I did provide Diana Joyce with a  home exercise series for frozen shoulder, my athletic trainer did walk through these exercises with them today.  Diana Joyce is to perform these once daily to work on stretching out the capsule.  Did recommend trying not to pop the shoulder intentionally.  May take over-the-counter anti-inflammatories as needed.  Discussed if they maintain range of motion through the exercises and tight glucose control this would decrease the recurrence and improve their symptoms.  Follow-up: PRN   Meds & Orders: No orders of the defined types were placed in this encounter.  No orders of the defined types were placed in this encounter.    Procedures: No procedures performed      Clinical History: No specialty comments available.  He reports that he has been smoking cigars. He has never used smokeless tobacco.  Recent Labs    08/12/21 0937 11/12/21 0937 02/17/22 1128  HGBA1C 8.0* 6.5 6.2    Objective:   Vital Signs: BP 130/88   Pulse 94   Ht 5\' 8"  (1.727 m)   Wt (!) 305 lb (138.3 kg)   BMI 46.38 kg/m   Physical Exam  Gen: Well-appearing, in no acute distress; non-toxic CV: Regular Rate. Well-perfused. Warm.  Resp: Breathing unlabored on room air; no wheezing. Psych: Fluid speech in conversation; appropriate affect; normal thought process Neuro: Sensation intact throughout. No gross coordination deficits.   Ortho Exam -Examination of the left shoulder demonstrates no bony TTP.  There is no redness or swelling.  There is some restriction in both active  and passive range of motion with forward flexion to approximately 160 degrees, abduction 120 degrees, external rotation 70 degrees and rather equivocal to the contralateral side.  There is no abnormal scapular function observed.  Rotator cuff testing appears intact with good strength.  Negative crossarm adduction, negative empty can.  Imaging:  *Independent review of the left shoulder x-ray from 02/18/2022 shows 3 views of the shoulder.  There is  normal alignment of the humeral head.  There is no significant osteoarthritis within the glenohumeral joint or the [facility] joint.  No acute findings noted.  DG Shoulder Left CLINICAL DATA:  Chronic left shoulder pain with decreased range of motion.  EXAM: LEFT SHOULDER - 2+ VIEW  COMPARISON:  None Available.  FINDINGS: Normal alignment of the glenohumeral and acromioclavicular joints. No significant osteoarthritis. No acute fracture or dislocation. The visualized portion of the left lung is unremarkable.  IMPRESSION: Normal left shoulder radiographs.  Electronically Signed   By: Yvonne Kendall M.D.   On: 02/18/2022 08:40    Past Medical/Family/Surgical/Social History: Medications & Allergies reviewed per EMR, new medications updated. Patient Active Problem List   Diagnosis Date Noted   Tobacco abuse 03/06/2020   Sleep apnea 12/07/2017   Class 3 severe obesity due to excess calories without serious comorbidity with body mass index (BMI) of 50.0 to 59.9 in adult ([place]) 06/19/2017   Diabetes ([place])    Past Medical History:  Diagnosis Date   Apnea, sleep    Asthma    Diabetes ([place])    Family History  Problem Relation Age of Onset   Kidney failure Mother    Hypertension Mother    Diabetes Mother    Heart failure Mother    Diabetes Father    Past Surgical History:  Procedure Laterality Date   FRACTURE SURGERY     Social History   Occupational History   Not on file  Tobacco Use   Smoking status: Every Day    Types: Cigars   Smokeless tobacco: Never  Vaping Use   Vaping Use: Never used  Substance and Sexual Activity   Alcohol use: Yes   Drug use: No   Sexual activity: Yes    Birth control/protection: None

## 2022-03-05 ENCOUNTER — Other Ambulatory Visit: Payer: Self-pay

## 2022-03-06 ENCOUNTER — Encounter: Payer: Self-pay | Admitting: Family Medicine

## 2022-03-06 ENCOUNTER — Other Ambulatory Visit: Payer: Self-pay

## 2022-03-10 ENCOUNTER — Other Ambulatory Visit: Payer: Self-pay

## 2022-03-11 ENCOUNTER — Other Ambulatory Visit: Payer: Self-pay

## 2022-03-12 ENCOUNTER — Other Ambulatory Visit: Payer: Self-pay

## 2022-03-20 ENCOUNTER — Other Ambulatory Visit: Payer: Self-pay

## 2022-03-21 ENCOUNTER — Other Ambulatory Visit: Payer: Self-pay

## 2022-03-24 ENCOUNTER — Other Ambulatory Visit: Payer: Self-pay

## 2022-03-26 ENCOUNTER — Other Ambulatory Visit: Payer: Self-pay

## 2022-04-01 ENCOUNTER — Other Ambulatory Visit (HOSPITAL_COMMUNITY): Payer: Self-pay

## 2022-04-01 MED ORDER — "EASY TOUCH FLIPLOCK NEEDLES 18G X 1"" MISC"
1 refills | Status: DC
  Filled 2022-04-01: qty 12, 84d supply, fill #0

## 2022-04-01 MED ORDER — TESTOSTERONE CYPIONATE 200 MG/ML IM SOLN
100.0000 mg | INTRAMUSCULAR | 1 refills | Status: DC
  Filled 2022-04-01: qty 6, 42d supply, fill #0
  Filled 2022-06-10: qty 6, 42d supply, fill #1

## 2022-04-01 MED ORDER — "BD DISP NEEDLES 27G X 1/2"" MISC"
1 refills | Status: DC
  Filled 2022-04-01: qty 12, 84d supply, fill #0
  Filled 2022-04-03: qty 20, 20d supply, fill #0

## 2022-04-01 MED ORDER — "BD TB SYRINGE 27G X 1/2"" 1 ML MISC"
1 refills | Status: AC
  Filled 2022-04-01: qty 12, 84d supply, fill #0
  Filled 2022-04-01: qty 20, 20d supply, fill #0

## 2022-04-02 ENCOUNTER — Other Ambulatory Visit: Payer: Self-pay

## 2022-04-03 ENCOUNTER — Other Ambulatory Visit: Payer: Self-pay

## 2022-04-03 ENCOUNTER — Other Ambulatory Visit (HOSPITAL_COMMUNITY): Payer: Self-pay

## 2022-04-14 ENCOUNTER — Other Ambulatory Visit: Payer: Self-pay

## 2022-04-16 ENCOUNTER — Other Ambulatory Visit: Payer: Self-pay

## 2022-04-24 ENCOUNTER — Other Ambulatory Visit: Payer: Self-pay

## 2022-05-15 ENCOUNTER — Other Ambulatory Visit: Payer: Self-pay

## 2022-05-16 ENCOUNTER — Other Ambulatory Visit: Payer: Self-pay

## 2022-05-20 ENCOUNTER — Emergency Department (HOSPITAL_COMMUNITY): Payer: 59

## 2022-05-20 ENCOUNTER — Emergency Department (HOSPITAL_COMMUNITY)
Admission: EM | Admit: 2022-05-20 | Discharge: 2022-05-20 | Discharge status: Against Medical Advice | Payer: 59 | Attending: Student | Admitting: Student

## 2022-05-20 ENCOUNTER — Encounter (HOSPITAL_COMMUNITY): Payer: Self-pay | Admitting: Emergency Medicine

## 2022-05-20 DIAGNOSIS — Z5321 Procedure and treatment not carried out due to patient leaving prior to being seen by health care provider: Secondary | ICD-10-CM | POA: Diagnosis not present

## 2022-05-20 DIAGNOSIS — R079 Chest pain, unspecified: Secondary | ICD-10-CM | POA: Diagnosis not present

## 2022-05-20 LAB — COMPREHENSIVE METABOLIC PANEL
ALT: 42 U/L (ref 0–44)
AST: 45 U/L — ABNORMAL HIGH (ref 15–41)
Albumin: 3.4 g/dL — ABNORMAL LOW (ref 3.5–5.0)
Alkaline Phosphatase: 67 U/L (ref 38–126)
Anion gap: 8 (ref 5–15)
BUN: 7 mg/dL (ref 6–20)
CO2: 25 mmol/L (ref 22–32)
Calcium: 8.9 mg/dL (ref 8.9–10.3)
Chloride: 106 mmol/L (ref 98–111)
Creatinine, Ser: 0.78 mg/dL (ref 0.44–1.00)
GFR, Estimated: >60 mL/min (ref >60)
Glucose, Bld: 100 mg/dL — ABNORMAL HIGH (ref 70–99)
Potassium: 3.7 mmol/L (ref 3.5–5.1)
Sodium: 139 mmol/L (ref 135–145)
Total Bilirubin: 0.5 mg/dL (ref 0.3–1.2)
Total Protein: 7.3 g/dL (ref 6.5–8.1)

## 2022-05-20 LAB — CBC WITH DIFFERENTIAL/PLATELET
Abs Immature Granulocytes: 0.03 10*3/uL (ref 0.00–0.07)
Basophils Absolute: 0 10*3/uL (ref 0.0–0.1)
Basophils Relative: 1 %
Eosinophils Absolute: 0.1 10*3/uL (ref 0.0–0.5)
Eosinophils Relative: 1 %
HCT: 52.2 % — ABNORMAL HIGH (ref 36.0–46.0)
Hemoglobin: 16.2 g/dL — ABNORMAL HIGH (ref 12.0–15.0)
Immature Granulocytes: 0 %
Lymphocytes Relative: 33 %
Lymphs Abs: 2.9 10*3/uL (ref 0.7–4.0)
MCH: 24.5 pg — ABNORMAL LOW (ref 26.0–34.0)
MCHC: 31 g/dL (ref 30.0–36.0)
MCV: 79.1 fL — ABNORMAL LOW (ref 80.0–100.0)
Monocytes Absolute: 0.5 10*3/uL (ref 0.1–1.0)
Monocytes Relative: 6 %
Neutro Abs: 5.1 10*3/uL (ref 1.7–7.7)
Neutrophils Relative %: 59 %
Platelets: 324 10*3/uL (ref 150–400)
RBC: 6.6 MIL/uL — ABNORMAL HIGH (ref 3.87–5.11)
RDW: 17.5 % — ABNORMAL HIGH (ref 11.5–15.5)
WBC: 8.7 10*3/uL (ref 4.0–10.5)
nRBC: 0 % (ref 0.0–0.2)

## 2022-05-20 LAB — TROPONIN I (HIGH SENSITIVITY)
Troponin I (High Sensitivity): 4 ng/L (ref <18)
Troponin I (High Sensitivity): 4 ng/L (ref <18)

## 2022-05-20 NOTE — ED Triage Notes (Signed)
Pt coming from work started to get left sided chest pain worse with inspiration and exertion. It resolved when she rested. VSS. Aspirin given by coworkers. SHe reports SOB with walking.

## 2022-05-20 NOTE — ED Provider Triage Note (Cosign Needed)
Emergency Medicine Provider Triage Evaluation Note  Diana Joyce , a 31 y.o. adult  was evaluated in triage.  Pt complains of exertional chest pain that started this evening, DOE. No hx of same, took 324 of ASA prior to EMS arrival to the scene. Resolution of pain with rest.  No recent travel, immobilization, surgeries, no history of clots.  Patient is currently on hormone replacement therapy with testosterone with last injection today.  Review of Systems  Positive: As above Negative: palpitations  Physical Exam  There were no vitals taken for this visit. Gen:   Awake, no distress   Resp:  Normal effort  MSK:   Moves extremities without difficulty  Other:  RRR no m/r/g  Medical Decision Making  Medically screening exam initiated at 12:23 AM.  Appropriate orders placed.  Diana Joyce was informed that the remainder of the evaluation will be completed by another provider, this initial triage assessment does not replace that evaluation, and the importance of remaining in the ED until their evaluation is complete.  This chart was dictated using voice recognition software, Dragon. Despite the best efforts of this provider to proofread and correct errors, errors may still occur which can change documentation meaning.    Paris Lore, PA-C 05/20/22 [id number]

## 2022-05-21 ENCOUNTER — Ambulatory Visit: Payer: Self-pay

## 2022-05-21 ENCOUNTER — Telehealth: Payer: Self-pay | Admitting: Emergency Medicine

## 2022-05-21 NOTE — Telephone Encounter (Signed)
     Chief Complaint: Chest pain, left side. No radiation. BP elevated - 151/110. Symptoms: Above. Sharp pain Frequency: Monday. Went to ED, left AMA due to wait time. Pertinent Negatives: Patient denies SOB Disposition: [] ED /[] Urgent Care (no appt availability in office) / [] Appointment(In office/virtual)/ []  [place] [facility]/ [] Home Care/ [] Refused Recommended Disposition /[] [place] [facility]/ []  Follow-up with PCP Additional Notes: Asking to be worked in. Asking to have labs from ED visit reviewed with pt. Please advise.  Answer Assessment - Initial Assessment Questions 1. LOCATION: "Where does it hurt?"       Left side 2. RADIATION: "Does the pain go anywhere else?" (e.g., into neck, jaw, arms, back)     No 3. ONSET: "When did the chest pain begin?" (Minutes, hours or days)      Monday 4. PATTERN: "Does the pain come and go, or has it been constant since it started?"  "Does it get worse with exertion?"      Comes and goes 5. DURATION: "How long does it last" (e.g., seconds, minutes, hours)     Lasts 1-2 hours 6. SEVERITY: "How bad is the pain?"  (e.g., Scale 1-10; mild, moderate, or severe)    - MILD (1-3): doesn't interfere with normal activities     - MODERATE (4-7): interferes with normal activities or awakens from sleep    - SEVERE (8-10): excruciating pain, unable to do any normal activities       Now - 3 7. CARDIAC RISK FACTORS: "Do you have any history of heart problems or risk factors for heart disease?" (e.g., angina, prior heart attack; diabetes, high blood pressure, high cholesterol, smoker, or strong family history of heart disease)     No 8. PULMONARY RISK FACTORS: "Do you have any history of lung disease?"  (e.g., blood clots in lung, asthma, emphysema, birth control pills)     Asthma 9. CAUSE: "What do you think is causing the chest pain?"     Unsure 10. OTHER SYMPTOMS: "Do you have any other symptoms?" (e.g., dizziness, nausea, vomiting, sweating,  fever, difficulty breathing, cough)       No 11. PREGNANCY: "Is there any chance you are pregnant?" "When was your last menstrual period?"       No  Protocols used: Chest Pain-A-AH

## 2022-05-21 NOTE — Telephone Encounter (Signed)
Copied from CRM [id number]. Topic: Appointment Scheduling - Scheduling Inquiry for Clinic >> May 21, 2022  4:22 PM Diana Joyce wrote: Pt returning call regarding appt  Please assist further

## 2022-05-21 NOTE — Telephone Encounter (Signed)
Attempt to schedule patient on [facility] schedule.Left message on voicemail to return call.

## 2022-05-22 ENCOUNTER — Other Ambulatory Visit: Payer: Self-pay

## 2022-05-22 ENCOUNTER — Ambulatory Visit: Payer: 59 | Attending: Family Medicine | Admitting: Family Medicine

## 2022-05-22 ENCOUNTER — Encounter: Payer: Self-pay | Admitting: Family Medicine

## 2022-05-22 VITALS — BP 126/84 | HR 90 | Ht 65.0 in | Wt 302.2 lb

## 2022-05-22 DIAGNOSIS — E1165 Type 2 diabetes mellitus with hyperglycemia: Secondary | ICD-10-CM

## 2022-05-22 DIAGNOSIS — R030 Elevated blood-pressure reading, without diagnosis of hypertension: Secondary | ICD-10-CM

## 2022-05-22 DIAGNOSIS — R0789 Other chest pain: Secondary | ICD-10-CM

## 2022-05-22 DIAGNOSIS — F439 Reaction to severe stress, unspecified: Secondary | ICD-10-CM | POA: Diagnosis not present

## 2022-05-22 MED ORDER — DAPAGLIFLOZIN PROPANEDIOL 10 MG PO TABS
10.0000 mg | ORAL_TABLET | Freq: Every day | ORAL | 1 refills | Status: DC
  Filled 2022-05-22 – 2022-06-03 (×3): qty 90, 90d supply, fill #0
  Filled 2022-08-28: qty 90, 90d supply, fill #1

## 2022-05-22 NOTE — Progress Notes (Signed)
Chest pains, Bp was elevated, discuss labs results from hospital.

## 2022-05-22 NOTE — Progress Notes (Signed)
Subjective:  Patient ID: Diana Joyce, adult    DOB: [birth year]  Age: 31 y.o. MRN: [id number]  CC: Chest Pain   HPI Diana Joyce is a 31 y.o. 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 BP was also severely elevated even though he does not have a diagnosis of hypertension. ED revealed negative troponins, polycythemia and minimally elevated AST.  Chest x-ray was negative for acute cardiopulmonary process.    He also had a BP of 150/110 at home. Endorses being stressed a lot. Working 12 hour shifts x6 days for 2 weeks. When he is not at work he takes his girlfriend to dialysis and he endorses being stressed.  He has not been able to work out as he would usually like to. Sometimes after sexual intimacy he has also noticed some chest pains.  Pain is absent at the moment. Denies a family history of early cardiac death but does have a family history of diabetes, hypertension and CHF in mom who is now deceased and dad has hypertension and diabetes. At the moment he is asymptomatic. Past Medical History:  Diagnosis Date   Apnea, sleep    Asthma    Diabetes ([place])     Past Surgical History:  Procedure Laterality Date   FRACTURE SURGERY      Family History  Problem Relation Age of Onset   Kidney failure Mother    Hypertension Mother    Diabetes Mother    Heart failure Mother    Diabetes Father     Social History   Socioeconomic History   Marital status: Single    Spouse name: Not on file   Number of children: Not on file   Years of education: Not on file   Highest education level: Not on file  Occupational History   Not on file  Tobacco Use   Smoking  status: Every Day    Types: Cigars   Smokeless tobacco: Never  Vaping Use   Vaping Use: Never used  Substance and Sexual Activity   Alcohol use: Yes   Drug use: No   Sexual activity: Yes    Birth control/protection: None  Other Topics Concern   Not on file  Social History Narrative   Not on file   Social Determinants of Health   Financial Resource Strain: Not on file  Food Insecurity: Not on file  Transportation Needs: Not on file  Physical Activity: Not on file  Stress: Not on file  Social Connections: Not on file    Allergies  Allergen Reactions   Benadryl [Diphenhydramine Hcl] Other (See Comments)    Hives, coughing, itching.     Outpatient Medications Prior to Visit  Medication Sig Dispense Refill   blood glucose meter kit and supplies KIT Dispense with test strips and lancets based on insurance preference. Use up to four times daily as directed. (FOR ICD-9 250.00, 250.01). 100 each 12   Blood Glucose Monitoring Suppl (TRUE METRIX METER) w/Device KIT Check blood sugars fasting and at bedtime daily 1 kit 1   dapagliflozin propanediol (FARXIGA) 10 MG TABS tablet Take 1 tablet (10 mg total) by mouth daily  before breakfast. 90 tablet 1   glucose blood (TRUE METRIX BLOOD GLUCOSE TEST) test strip Use as instructed 100 each 12   insulin glargine (LANTUS) 100 UNIT/ML injection Inject 0.5 mLs (50 Units total) into the skin daily. 190 mL 3   insulin lispro (HUMALOG) 100 UNIT/ML KwikPen INJECT 0-12 UNITS INTO THE SKIN 3 (THREE) TIMES DAILY. ACCORDING TO SLIDING SCALE 30 mL 6   Insulin Pen Needle (PEN NEEDLES) 32G X 4 MM MISC 1 Dose by Does not apply route daily. 100 each 1   NEEDLE, DISP, 18 G (EASY TOUCH FLIPLOCK NEEDLES) 18G X 1" MISC use as directed 20 each 0   NEEDLE, DISP, 18 G (EASY TOUCH FLIPLOCK NEEDLES) 18G X 1" MISC use as directed 20 each 1   NEEDLE, DISP, 18 G 18G X 1" MISC Use as directed 20 each 0   NEEDLE, DISP, 25 G (BD DISP NEEDLES) 25G X 5/8" MISC See admin  instructions. 20 each 0   NEEDLE, DISP, 25 G (EASY TOUCH FLIPLOCK NEEDLES) 25G X 5/8" MISC use as directed 20 each 0   NEEDLE, DISP, 25 G 25G X 5/8" MISC Use as directed. 20 each 0   NEEDLE, DISP, 27 G (BD DISP NEEDLES) 27G X 1/2" MISC Use as directed 20 each 1   Syringe, Disposable, (B-D SYRINGE LUER-LOK 1CC) 1 ML MISC use as directed 20 each 0   Syringe, Disposable, (B-D SYRINGE LUER-LOK 1CC) 1 ML MISC use as directed 20 each 0   Syringe, Disposable, (B-D SYRINGE LUER-LOK 1CC) 1 ML MISC use as directed 20 each 0   Syringe, Disposable, (BD PLASTIPAK SYRINGE) 3 ML MISC See admin instructions. 20 each 0   testosterone cypionate (DEPOTESTOSTERONE CYPIONATE) 200 MG/ML injection Inject 0.5 mLs (100 mg total) into the skin once a week. 6 mL 1   tirzepatide (MOUNJARO) 10 MG/0.5ML Pen Inject 10 mg into the skin once a week. 6 mL 6   TRUEplus Lancets 28G MISC Use as directed 3 (three) times daily before meals. 100 each 12   TUBERCULIN SYR 1CC/27GX1/2" (B-D TB SYRINGE 1CC/27GX1/2") 27G X 1/2" 1 ML MISC Use as directed. 20 each 1   aluminum chloride (DRYSOL) 20 % external solution Apply topically at bedtime. (Patient not taking: Reported on 05/22/2022) 35 mL 1   Norgestimate-Ethinyl Estradiol Triphasic (ORTHO TRI-CYCLEN LO) 0.18/0.215/0.25 MG-25 MCG tab Take 1 tablet by mouth daily. (Patient not taking: Reported on 02/17/2022) 1 Package 11   nystatin (MYCOSTATIN/NYSTOP) powder Apply 1 application topically 3 (three) times daily. (Patient not taking: Reported on 02/17/2022) 60 g 1   tirzepatide (MOUNJARO) 7.5 MG/0.5ML Pen Inject 7.5 mg into the skin once a week. For 4 weeks then increase to 55m (Patient not taking: Reported on 05/22/2022) 6 mL 0   No facility-administered medications prior to visit.     ROS Review of Systems  Constitutional:  Negative for activity change and appetite change.  HENT:  Negative for sinus pressure and sore throat.   Respiratory:  Negative for chest tightness, shortness of  breath and wheezing.   Cardiovascular:  Negative for chest pain and palpitations.  Gastrointestinal:  Negative for abdominal distention, abdominal pain and constipation.  Genitourinary: Negative.   Musculoskeletal: Negative.   Psychiatric/Behavioral:  Negative for behavioral problems and dysphoric mood.     Objective:  BP 126/84   Pulse 90   Ht _0  (1.651 m)   Wt (!) 302 lb 3.2 oz (137.1 kg)   SpO2 98%   BMI 50.29  kg/m      05/22/2022   10:41 AM 05/20/2022   12:31 AM 02/24/2022   10:06 AM  BP/Weight  Systolic BP 161 096 045  Diastolic BP 84 409 88  Wt. (Lbs) 302.2  305  BMI 50.29 kg/m2  46.38 kg/m2    Wt Readings from Last 3 Encounters:  05/22/22 (!) 302 lb 3.2 oz (137.1 kg)  02/24/22 (!) 305 lb (138.3 kg)  02/17/22 (!) 307 lb 3.2 oz (139.3 kg)      Physical Exam Constitutional:      Appearance: He is well-developed.  Cardiovascular:     Rate and Rhythm: Normal rate.     Heart sounds: Normal heart sounds. No murmur heard. Pulmonary:     Effort: Pulmonary effort is normal.     Breath sounds: Normal breath sounds. No wheezing or rales.  Chest:     Chest wall: No tenderness.  Abdominal:     General: Bowel sounds are normal. There is no distension.     Palpations: Abdomen is soft. There is no mass.     Tenderness: There is no abdominal tenderness.  Musculoskeletal:        General: Normal range of motion.     Right lower leg: No edema.     Left lower leg: No edema.  Neurological:     Mental Status: He is alert and oriented to person, place, and time.  Psychiatric:        Mood and Affect: Mood normal.        Latest Ref Rng & Units 05/20/2022   12:51 AM 02/17/2022   12:00 PM 08/12/2021   10:11 AM  CMP  Glucose 70 - 99 mg/dL 100  109  100   BUN 6 - 20 mg/dL _0 Creatinine 0.44 - 1.00 mg/dL 0.78  0.74  0.85   Sodium 135 - 145 mmol/L 139  139  139   Potassium 3.5 - 5.1 mmol/L 3.7  4.6  4.6   Chloride 98 - 111 mmol/L 106  103  103   CO2 22 - 32  mmol/L _1 Calcium 8.9 - 10.3 mg/dL 8.9  9.1  9.0   Total Protein 6.5 - 8.1 g/dL 7.3   7.5   Total Bilirubin 0.3 - 1.2 mg/dL 0.5   0.4   Alkaline Phos 38 - 126 U/L 67   104   AST 15 - 41 U/L 45   22   ALT 0 - 44 U/L 42   20     Lipid Panel     Component Value Date/Time   CHOL 157 08/12/2021 1011   TRIG 103 08/12/2021 1011   HDL 29 (L) 08/12/2021 1011   CHOLHDL 4.2 08/01/2020 1033   LDLCALC 109 (H) 08/12/2021 1011    CBC    Component Value Date/Time   WBC 8.7 05/20/2022 0051   RBC 6.60 (H) 05/20/2022 0051   HGB 16.2 (H) 05/20/2022 0051   HGB 16.8 (H) 02/17/2022 1200   HCT 52.2 (H) 05/20/2022 0051   HCT 53.0 (H) 02/17/2022 1200   PLT 324 05/20/2022 0051   PLT 328 02/17/2022 1200   MCV 79.1 (L) 05/20/2022 0051   MCV 77 (L) 02/17/2022 1200   MCH 24.5 (L) 05/20/2022 0051   MCHC 31.0 05/20/2022 0051   RDW 17.5 (H) 05/20/2022 0051   RDW 18.1 (H) 02/17/2022 1200   LYMPHSABS 2.9 05/20/2022 0051   LYMPHSABS 2.9 02/17/2022 1200  MONOABS 0.5 05/20/2022 0051   EOSABS 0.1 05/20/2022 0051   EOSABS 0.1 02/17/2022 1200   BASOSABS 0.0 05/20/2022 0051   BASOSABS 0.0 02/17/2022 1200    Lab Results  Component Value Date   HGBA1C 6.2 02/17/2022    Assessment & Plan:  1. Atypical chest pain Likely stress-induced We have discussed other possible causes including costochondritis We have discussed modalities for stress management including yoga, meditation Counseled about risk factor modification for cardiovascular disease  2. Elevated blood pressure reading Blood pressure is normal in the clinic Encouraged to keep a blood pressure log at home and we have discussed the goal blood pressure on a blood pressure is above goal he can send me a MyChart message for initiation of medication  3. Stress Discussed stress management strategies Unfortunately long hours at work is also contributing.   No orders of the defined types were placed in this encounter.   Follow-up:  Return for previously scheduled appointment.       Charlott Rakes, MD, FAAFP. [facility] and [place], [place]   05/22/2022, 11:26 AM

## 2022-05-22 NOTE — Patient Instructions (Signed)
Managing Stress, Adult Feeling a certain amount of stress is normal. Stress helps our body and mind get ready to deal with the demands of life. Stress hormones can motivate you to do well at work and meet your responsibilities. But severe or long-term (chronic) stress can affect your mental and physical health. Chronic stress puts you at higher risk for: Anxiety and depression. Other health problems such as digestive problems, muscle aches, heart disease, high blood pressure, and stroke. What are the causes? Common causes of stress include: Demands from work, such as deadlines, feeling overworked, or having long hours. Pressures at home, such as money issues, disagreements with a spouse, or parenting issues. Pressures from major life changes, such as divorce, moving, loss of a loved one, or chronic illness. You may be at higher risk for stress-related problems if you: Do not get enough sleep. Are in poor health. Do not have emotional support. Have a mental health disorder such as anxiety or depression. How to recognize stress Stress can make you: Have trouble sleeping. Feel sad, anxious, irritable, or overwhelmed. Lose your appetite. Overeat or want to eat unhealthy foods. Want to use drugs or alcohol. Stress can also cause physical symptoms, such as: Sore, tense muscles, especially in the shoulders and neck. Headaches. Trouble breathing. A faster heart rate. Stomach pain, nausea, or vomiting. Diarrhea or constipation. Trouble concentrating. Follow these instructions at home: Eating and drinking Eat a healthy diet. This includes: Eating foods that are high in fiber, such as beans, whole grains, and fresh fruits and vegetables. Limiting foods that are high in fat and processed sugars, such as fried or sweet foods. Do not skip meals or overeat. Drink enough fluid to keep your urine pale yellow. Alcohol use Do not drink alcohol if: Your health care provider tells you not to  drink. You are pregnant, may be pregnant, or are planning to become pregnant. Drinking alcohol is a way some people try to ease their stress. This can be dangerous, so if you drink alcohol: Limit how much you have to: 0-1 drink a day for women. 0-2 drinks a day for men. Know how much alcohol is in your drink. In the [place] one drink equals one 12 oz bottle of beer (355 mL), one 5 oz glass of wine (148 mL), or one 1 oz glass of hard liquor (44 mL). Activity  Include 30 minutes of exercise in your daily schedule. Exercise is a good stress reducer. Include time in your day for an activity that you find relaxing. Try taking a walk, going on a bike ride, reading a book, or listening to music. Schedule your time in a way that lowers stress, and keep a regular schedule. Focus on doing what is most important to get done. Lifestyle Identify the source of your stress and your reaction to it. See a therapist who can help you change unhelpful reactions. When there are stressful events: Talk about them with family, friends, or coworkers. Try to think realistically about stressful events and not ignore them or overreact. Try to find the positives in a stressful situation and not focus on the negatives. Cut back on responsibilities at work and home, if possible. Ask for help from friends or family members if you need it. Find ways to manage stress, such as: Mindfulness, meditation, or deep breathing. Yoga or tai chi. Progressive muscle relaxation. Spending time in nature. Doing art, playing music, or reading. Making time for fun activities. Spending time with family and friends. Get support  from family, friends, or spiritual resources. General instructions Get enough sleep. Try to go to sleep and get up at about the same time every day. Take over-the-counter and prescription medicines only as told by your health care provider. Do not use any products that contain nicotine or tobacco. These products  include cigarettes, chewing tobacco, and vaping devices, such as e-cigarettes. If you need help quitting, ask your health care provider. Do not use drugs or smoke to deal with stress. Keep all follow-up visits. This is important. Where to find support Talk with your health care provider about stress management or finding a support group. Find a therapist to work with you on your stress management techniques. Where to find more information [employer] on Mental Illness: www.nami.org American Psychological Association: TVStereos.ch Contact a health care provider if: Your stress symptoms get worse. You are unable to manage your stress at home. You are struggling to stop using drugs or alcohol. Get help right away if: You may be a danger to yourself or others. You have any thoughts of death or suicide. Get help right awayif you feel like you may hurt yourself or others, or have thoughts about taking your own life. Go to your nearest emergency room or: Call 911. Call the [facility] at 289-837-1647 or 988 in the [place] This is open 24 hours a day. Text the Crisis Text Line at (726)508-3618. Summary Feeling a certain amount of stress is normal, but severe or long-term (chronic) stress can affect your mental and physical health. Chronic stress can put you at higher risk for anxiety, depression, and other health problems such as digestive problems, muscle aches, heart disease, high blood pressure, and stroke. You may be at higher risk for stress-related problems if you do not get enough sleep, are in poor health, lack emotional support, or have a mental health disorder such as anxiety or depression. Identify the source of your stress and your reaction to it. Try talking about stressful events with family, friends, or coworkers, finding a coping method, or getting support from spiritual resources. If you need more help, talk with your health care provider about finding a  support group or a mental health therapist. This information is not intended to replace advice given to you by your health care provider. Make sure you discuss any questions you have with your health care provider. Document Revised: 12/20/2020 Document Reviewed: 12/18/2020 Elsevier Patient Education  [place].

## 2022-05-29 ENCOUNTER — Other Ambulatory Visit: Payer: Self-pay

## 2022-06-03 ENCOUNTER — Other Ambulatory Visit: Payer: Self-pay

## 2022-06-04 ENCOUNTER — Other Ambulatory Visit: Payer: Self-pay

## 2022-06-11 ENCOUNTER — Other Ambulatory Visit: Payer: Self-pay

## 2022-06-11 ENCOUNTER — Emergency Department (HOSPITAL_BASED_OUTPATIENT_CLINIC_OR_DEPARTMENT_OTHER): Payer: 59 | Admitting: Radiology

## 2022-06-11 ENCOUNTER — Encounter (HOSPITAL_BASED_OUTPATIENT_CLINIC_OR_DEPARTMENT_OTHER): Payer: Self-pay

## 2022-06-11 ENCOUNTER — Emergency Department (HOSPITAL_BASED_OUTPATIENT_CLINIC_OR_DEPARTMENT_OTHER)
Admission: EM | Admit: 2022-06-11 | Discharge: 2022-06-11 | Disposition: A | Discharge status: Home / Self Care | Payer: 59 | Attending: Emergency Medicine | Admitting: Emergency Medicine

## 2022-06-11 DIAGNOSIS — Z794 Long term (current) use of insulin: Secondary | ICD-10-CM | POA: Diagnosis not present

## 2022-06-11 DIAGNOSIS — J101 Influenza due to other identified influenza virus with other respiratory manifestations: Secondary | ICD-10-CM | POA: Diagnosis not present

## 2022-06-11 DIAGNOSIS — Z1152 Encounter for screening for COVID-19: Secondary | ICD-10-CM | POA: Insufficient documentation

## 2022-06-11 DIAGNOSIS — R059 Cough, unspecified: Secondary | ICD-10-CM | POA: Diagnosis present

## 2022-06-11 LAB — RESP PANEL BY RT-PCR (RSV, FLU A&B, COVID)  RVPGX2
Influenza A by PCR: NEGATIVE
Influenza B by PCR: POSITIVE — AB
Resp Syncytial Virus by PCR: NEGATIVE
SARS Coronavirus 2 by RT PCR: NEGATIVE

## 2022-06-11 LAB — GROUP A STREP BY PCR: Group A Strep by PCR: NOT DETECTED

## 2022-06-11 MED ORDER — BENZONATATE 100 MG PO CAPS
100.0000 mg | ORAL_CAPSULE | Freq: Three times a day (TID) | ORAL | 0 refills | Status: DC
  Filled 2022-06-11: qty 21, 7d supply, fill #0

## 2022-06-11 MED ORDER — ALBUTEROL SULFATE HFA 108 (90 BASE) MCG/ACT IN AERS
2.0000 | INHALATION_SPRAY | Freq: Once | RESPIRATORY_TRACT | Status: AC
  Administered 2022-06-11: 2 via RESPIRATORY_TRACT
  Filled 2022-06-11: qty 6.7

## 2022-06-11 NOTE — ED Triage Notes (Signed)
Patient here POV from Home  Endorses Cough and Sore Throat. Began approximately 7-8 days ago. Chills Initially.   NAD Noted during Triage. A&Ox4. GCS 15. Ambulatory.

## 2022-06-11 NOTE — Discharge Instructions (Addendum)
It was a pleasure taking care of you today!   Your COVID, Strep, and RSV swabs were negative today. Your chest xray didn't show concerning findings for pneumonia today. Your flu swab was positive for Flu B. You will be given an albuterol inhaler, use as directed 2 puffs every 4 hours as needed.  You will send a prescription for [facility], use as directed.  You may continue with over the counter cough and cold medications. Ensure to maintain fluid intake. Follow up with your primary care provider regarding todays ED visit. Ensure that you are wearing your mask and practicing good hand hygiene. Return to the ED if you are experiencing increasing/worsening symptoms.

## 2022-06-11 NOTE — ED Provider Notes (Signed)
[facility] EMERGENCY DEPT Provider Note   CSN: [id number] Arrival date & time: 06/11/22  1404     History  Chief Complaint  Patient presents with   Cough    Diana Joyce is a 32 y.o. adult who presents to the ED with concerns for cough onset 8 days.  Has associated rhinorrhea, nasal congestion, sore throat, chills.  Tried over-the-counter medications for his symptoms.  Denies chest pain, trouble swallowing, trouble breathing.  No sick contacts at home.  The history is provided by the patient. No language interpreter was used.       Home Medications Prior to Admission medications   Medication Sig Start Date End Date Taking? Authorizing Provider  benzonatate (TESSALON) 100 MG capsule Take 1 capsule (100 mg total) by mouth every 8 (eight) hours. 06/11/22  Yes Lonetta Blassingame A, PA-C  aluminum chloride (DRYSOL) 20 % external solution Apply topically at bedtime. Patient not taking: Reported on 05/22/2022 03/06/20   Charlott Rakes, MD  blood glucose meter kit and supplies KIT Dispense with test strips and lancets based on insurance preference. Use up to four times daily as directed. (FOR ICD-9 250.00, 250.01). 12/07/17   Charlott Rakes, MD  Blood Glucose Monitoring Suppl (TRUE METRIX METER) w/Device KIT Check blood sugars fasting and at bedtime daily 10/29/20   Charlott Rakes, MD  dapagliflozin propanediol (FARXIGA) 10 MG TABS tablet Take 1 tablet (10 mg total) by mouth daily before breakfast. 05/22/22   Charlott Rakes, MD  glucose blood (TRUE METRIX BLOOD GLUCOSE TEST) test strip Use as instructed 03/29/21   Charlott Rakes, MD  insulin glargine (LANTUS) 100 UNIT/ML injection Inject 0.5 mLs (50 Units total) into the skin daily. 06/12/21   Charlott Rakes, MD  insulin lispro (HUMALOG) 100 UNIT/ML KwikPen INJECT 0-12 UNITS INTO THE SKIN 3 (THREE) TIMES DAILY. ACCORDING TO SLIDING SCALE 06/12/21   Charlott Rakes, MD  Insulin Pen Needle (PEN NEEDLES) 32G X 4 MM MISC 1 Dose by Does not  apply route daily. 04/13/19   Charlott Rakes, MD  NEEDLE, DISP, 18 G (EASY TOUCH FLIPLOCK NEEDLES) 18G X 1" MISC use as directed 09/04/21     NEEDLE, DISP, 18 G (EASY TOUCH FLIPLOCK NEEDLES) 18G X 1" MISC use as directed 04/01/22     NEEDLE, DISP, 18 G 18G X 1" MISC Use as directed 09/17/21     NEEDLE, DISP, 25 G (BD DISP NEEDLES) 25G X 5/8" MISC See admin instructions. 05/21/21     NEEDLE, DISP, 25 G (EASY TOUCH FLIPLOCK NEEDLES) 25G X 5/8" MISC use as directed 09/04/21     NEEDLE, DISP, 25 G 25G X 5/8" MISC Use as directed. 09/17/21     NEEDLE, DISP, 27 G (BD DISP NEEDLES) 27G X 1/2" MISC Use as directed 04/01/22     Norgestimate-Ethinyl Estradiol Triphasic (ORTHO TRI-CYCLEN LO) 0.18/0.215/0.25 MG-25 MCG tab Take 1 tablet by mouth daily. Patient not taking: Reported on 02/17/2022 07/13/18   Charlott Rakes, MD  nystatin (MYCOSTATIN/NYSTOP) powder Apply 1 application topically 3 (three) times daily. Patient not taking: Reported on 02/17/2022 03/06/20   Charlott Rakes, MD  Syringe, Disposable, (B-D SYRINGE LUER-LOK 1CC) 1 ML MISC use as directed 03/15/21     Syringe, Disposable, (B-D SYRINGE LUER-LOK 1CC) 1 ML MISC use as directed 09/04/21     Syringe, Disposable, (B-D SYRINGE LUER-LOK 1CC) 1 ML MISC use as directed 09/17/21     Syringe, Disposable, (BD PLASTIPAK SYRINGE) 3 ML MISC See admin instructions. 05/21/21  testosterone cypionate (DEPOTESTOSTERONE CYPIONATE) 200 MG/ML injection Inject 0.5 mLs (100 mg total) into the skin once a week. 04/01/22     tirzepatide (MOUNJARO) 10 MG/0.5ML Pen Inject 10 mg into the skin once a week. 02/17/22   Charlott Rakes, MD  tirzepatide [facility]) 7.5 MG/0.5ML Pen Inject 7.5 mg into the skin once a week. For 4 weeks then increase to 78m Patient not taking: Reported on 05/22/2022 02/17/22   NCharlott Rakes MD  TRUEplus Lancets 28G MISC Use as directed 3 (three) times daily before meals. 10/29/20   NCharlott Rakes MD  TUBERCULIN SYR 1CC/27GX1/2" (B-D TB SYRINGE  1CC/27GX1/2") 27G X 1/2" 1 ML MISC Use as directed. 04/01/22         Allergies    Benadryl [diphenhydramine hcl]    Review of Systems   Review of Systems  Respiratory:  Positive for cough.   All other systems reviewed and are negative.   Physical Exam Updated Vital Signs BP (!) 159/95 (BP Location: Right Arm)   Pulse 88   Temp 98.4 F (36.9 C) (Oral)   Resp 18   Ht _0  (1.651 m)   Wt (!) 137.1 kg   SpO2 99%   BMI 50.30 kg/m  Physical Exam Vitals and nursing note reviewed.  Constitutional:      General: He is not in acute distress.    Appearance: He is not diaphoretic.  HENT:     Head: Normocephalic and atraumatic.     Mouth/Throat:     Pharynx: No oropharyngeal exudate.     Comments: Uvula midline without swelling. No posterior pharyngeal erythema or tonsillar exudate noted. Patent airway. Pt able to speak in clear complete sentences. Tolerating oral secretions. Eyes:     General: No scleral icterus.    Conjunctiva/sclera: Conjunctivae normal.  Cardiovascular:     Rate and Rhythm: Normal rate and regular rhythm.     Pulses: Normal pulses.     Heart sounds: Normal heart sounds.  Pulmonary:     Effort: Pulmonary effort is normal. No respiratory distress.     Breath sounds: Normal breath sounds. No wheezing.  Abdominal:     General: Bowel sounds are normal.     Palpations: Abdomen is soft. There is no mass.     Tenderness: There is no abdominal tenderness. There is no guarding or rebound.  Musculoskeletal:        General: Normal range of motion.     Cervical back: Normal range of motion and neck supple.  Skin:    General: Skin is warm and dry.  Neurological:     Mental Status: He is alert.  Psychiatric:        Behavior: Behavior normal.     ED Results / Procedures / Treatments   Labs (all labs ordered are listed, but only abnormal results are displayed) Labs Reviewed  RESP PANEL BY RT-PCR (RSV, FLU A&B, COVID)  [id number] - Abnormal; Notable for the  following components:      Result Value   Influenza B by PCR POSITIVE (*)    All other components within normal limits  GROUP A STREP BY PCR    EKG None  Radiology DG Chest 2 View  Result Date: 06/11/2022 CLINICAL DATA:  Cough. EXAM: CHEST - 2 VIEW COMPARISON:  May 20, 2022. FINDINGS: The heart size and mediastinal contours are within normal limits. Both lungs are clear. The visualized skeletal structures are unremarkable. IMPRESSION: No active cardiopulmonary disease. Electronically Signed   By: JJeneen Rinks  Murlean Caller M.D.   On: 06/11/2022 17:36    Procedures Procedures    Medications Ordered in ED Medications  albuterol (VENTOLIN HFA) 108 (90 Base) MCG/ACT inhaler 2 puff (2 puffs Inhalation Given 06/11/22 1638)    ED Course/ Medical Decision Making/ A&P Clinical Course as of 06/11/22 1904  Wed Jun 11, 2022  1612 Influenza B By PCR(!): POSITIVE [SB]  1748 Discussed with patient discharge treatment plan.  Answered all available questions.  Patient appears safe for discharge at this time. [SB]    Clinical Course User Index [SB] Blakelee Allington A, PA-C                           Medical Decision Making Amount and/or Complexity of Data Reviewed Labs:  Decision-making details documented in ED Course. Radiology: ordered.  Risk Prescription drug management.   Pt presents with cough onset 8 days.  No sick contacts.  Tried over-the-counter medications. Vital signs, patient afebrile. On exam, pt with no acute cardiovascular, respiratory, exam findings. Differential diagnosis includes COVID, flu, RSV, strep pharyngitis, viral pharyngitis, viral URI with cough, PNA.    Labs:  I ordered, and personally interpreted labs.  The pertinent results include:   Negative COVID, RSV, strep swab.  Positive flu B swab  Imaging: I ordered imaging studies including Chest x-ray I independently visualized and interpreted imaging which showed: No acute findings I agree with the radiologist  interpretation  Medications:  I ordered medication including albuterol inhaler for symptom management Reevaluation of the patient after these medicines and interventions, I reevaluated the patient and found that they have improved I have reviewed the patients home medicines and have made adjustments as needed   Disposition: Presentation suspicious for Flu B. Doubt COVID, RSV, Strep pharyngitis, or PNA at this time. Discussed with patient Tamiflu, however, patient symptoms > 48 hours for initiation for Tamiflu.  After consideration of the diagnostic results and the patients response to treatment, I feel that the patient would benefit from Discharge home. Rx for [employer]  Work note provided.  Supportive care measures and strict return precautions discussed with patient at bedside. Pt acknowledges and verbalizes understanding. Pt appears safe for discharge. Follow up as indicated in discharge paperwork.    This chart was dictated using voice recognition software, Dragon. Despite the best efforts of this provider to proofread and correct errors, errors may still occur which can change documentation meaning.   Final Clinical Impression(s) / ED Diagnoses Final diagnoses:  Influenza B    Rx / DC Orders ED Discharge Orders          Ordered    benzonatate (TESSALON) 100 MG capsule  Every 8 hours        06/11/22 1748              Sajad Glander A, PA-C 06/11/22 Massie Bougie, MD 06/18/22 1737

## 2022-06-12 ENCOUNTER — Other Ambulatory Visit: Payer: Self-pay

## 2022-06-17 ENCOUNTER — Other Ambulatory Visit (HOSPITAL_COMMUNITY): Payer: Self-pay

## 2022-06-19 ENCOUNTER — Other Ambulatory Visit: Payer: Self-pay

## 2022-07-01 ENCOUNTER — Encounter: Payer: Self-pay | Admitting: Family Medicine

## 2022-07-01 ENCOUNTER — Other Ambulatory Visit: Payer: Self-pay | Admitting: Family Medicine

## 2022-07-01 ENCOUNTER — Other Ambulatory Visit: Payer: Self-pay

## 2022-07-01 ENCOUNTER — Other Ambulatory Visit (HOSPITAL_COMMUNITY): Payer: Self-pay

## 2022-07-01 DIAGNOSIS — E1165 Type 2 diabetes mellitus with hyperglycemia: Secondary | ICD-10-CM

## 2022-07-01 MED ORDER — INSULIN GLARGINE 100 UNIT/ML ~~LOC~~ SOLN
50.0000 [IU] | Freq: Every day | SUBCUTANEOUS | 3 refills | Status: DC
  Filled 2022-07-01: qty 40, 80d supply, fill #0
  Filled 2022-07-20 – 2022-09-01 (×5): qty 40, 80d supply, fill #1
  Filled 2022-12-02 – 2022-12-03 (×2): qty 40, 80d supply, fill #2
  Filled 2023-02-22 – 2023-02-23 (×2): qty 40, 80d supply, fill #3

## 2022-07-02 ENCOUNTER — Other Ambulatory Visit: Payer: Self-pay

## 2022-07-10 ENCOUNTER — Other Ambulatory Visit: Payer: Self-pay

## 2022-07-11 ENCOUNTER — Other Ambulatory Visit: Payer: Self-pay

## 2022-07-14 ENCOUNTER — Other Ambulatory Visit: Payer: Self-pay

## 2022-07-21 ENCOUNTER — Other Ambulatory Visit: Payer: Self-pay

## 2022-07-23 ENCOUNTER — Other Ambulatory Visit: Payer: Self-pay

## 2022-08-13 ENCOUNTER — Other Ambulatory Visit: Payer: Self-pay

## 2022-08-14 ENCOUNTER — Other Ambulatory Visit (HOSPITAL_COMMUNITY): Payer: Self-pay

## 2022-08-14 ENCOUNTER — Other Ambulatory Visit: Payer: Self-pay

## 2022-08-15 ENCOUNTER — Other Ambulatory Visit (HOSPITAL_COMMUNITY): Payer: Self-pay

## 2022-08-15 ENCOUNTER — Other Ambulatory Visit: Payer: Self-pay

## 2022-08-15 MED ORDER — TESTOSTERONE CYPIONATE 200 MG/ML IM SOLN
100.0000 mg | INTRAMUSCULAR | 0 refills | Status: DC
  Filled 2022-08-15: qty 6, 84d supply, fill #0
  Filled 2022-09-17: qty 6, 42d supply, fill #0

## 2022-08-21 ENCOUNTER — Ambulatory Visit: Payer: 59 | Admitting: Family Medicine

## 2022-08-28 ENCOUNTER — Other Ambulatory Visit: Payer: Self-pay

## 2022-09-02 ENCOUNTER — Other Ambulatory Visit: Payer: Self-pay

## 2022-09-05 ENCOUNTER — Other Ambulatory Visit (HOSPITAL_COMMUNITY): Payer: Self-pay

## 2022-09-09 ENCOUNTER — Encounter: Payer: Self-pay | Admitting: Family Medicine

## 2022-09-09 ENCOUNTER — Ambulatory Visit: Payer: 59 | Attending: Family Medicine | Admitting: Family Medicine

## 2022-09-09 ENCOUNTER — Other Ambulatory Visit: Payer: Self-pay

## 2022-09-09 VITALS — BP 135/83 | HR 84 | Ht 65.0 in | Wt 299.6 lb

## 2022-09-09 DIAGNOSIS — Z6841 Body Mass Index (BMI) 40.0 and over, adult: Secondary | ICD-10-CM

## 2022-09-09 DIAGNOSIS — K219 Gastro-esophageal reflux disease without esophagitis: Secondary | ICD-10-CM

## 2022-09-09 DIAGNOSIS — Z794 Long term (current) use of insulin: Secondary | ICD-10-CM

## 2022-09-09 DIAGNOSIS — E1169 Type 2 diabetes mellitus with other specified complication: Secondary | ICD-10-CM

## 2022-09-09 LAB — POCT GLYCOSYLATED HEMOGLOBIN (HGB A1C): HbA1c, POC (controlled diabetic range): 7.1 % — AB (ref 0.0–7.0)

## 2022-09-09 MED ORDER — TIRZEPATIDE 12.5 MG/0.5ML ~~LOC~~ SOAJ
12.5000 mg | SUBCUTANEOUS | 6 refills | Status: DC
  Filled 2022-09-09: qty 2, 28d supply, fill #0
  Filled 2022-10-12 – 2022-10-20 (×2): qty 2, 28d supply, fill #1
  Filled 2022-11-08: qty 2, 28d supply, fill #2
  Filled 2022-12-02: qty 2, 28d supply, fill #3
  Filled 2023-01-05: qty 2, 28d supply, fill #4
  Filled 2023-02-09: qty 2, 28d supply, fill #5

## 2022-09-09 NOTE — Progress Notes (Signed)
Acid reflux

## 2022-09-09 NOTE — Progress Notes (Signed)
Subjective:  Patient ID: Diana Joyce, adult    DOB: 1991-06-04  Age: 32 y.o. MRN: [id number]  CC: Diabetes   HPI Diana Joyce is a 32 y.o. year old adult with a history of type 2 diabetes mellitus (A1c 7.1), asthma, morbid obesity, on testosterone therapy from [facility] for gender transition (female to female)   Interval History: Doing well on Mounjaro and continues to lose weight.  On one occasion he had missed his Mounjaro dose by a few days and noticed something he thought was reflux symptoms.He did have a burp that smelled like rotten egg but after that he felt okay.  He denies presence of nausea, vomiting, abdominal pain, constipation or diarrhea.  Denies eating late. Right now he is asymptomatic. Denies presence of hypoglycemia or neuropathy symptoms. He is now able to exercise more since his work schedule is more flexible.  Past Medical History:  Diagnosis Date   Apnea, sleep    Asthma    Diabetes     Past Surgical History:  Procedure Laterality Date   FRACTURE SURGERY      Family History  Problem Relation Age of Onset   Kidney failure Mother    Hypertension Mother    Diabetes Mother    Heart failure Mother    Diabetes Father     Social History   Socioeconomic History   Marital status: Single    Spouse name: Not on file   Number of children: Not on file   Years of education: Not on file   Highest education level: Not on file  Occupational History   Not on file  Tobacco Use   Smoking status: Former    Types: Cigars   Smokeless tobacco: Never  Vaping Use   Vaping Use: Never used  Substance and Sexual Activity   Alcohol use: Yes   Drug use: No   Sexual activity: Yes    Birth control/protection: None  Other Topics Concern   Not on file  Social History Narrative   Not on file   Social Determinants of Health   Financial Resource Strain: Not on file  Food Insecurity: Not on file  Transportation Needs: Not on file  Physical Activity: Not on  file  Stress: Not on file  Social Connections: Not on file    Allergies  Allergen Reactions   Benadryl [Diphenhydramine Hcl] Other (See Comments)    Hives, coughing, itching.     Outpatient Medications Prior to Visit  Medication Sig Dispense Refill   aluminum chloride (DRYSOL) 20 % external solution Apply topically at bedtime. 35 mL 1   blood glucose meter kit and supplies KIT Dispense with test strips and lancets based on insurance preference. Use up to four times daily as directed. (FOR ICD-9 250.00, 250.01). 100 each 12   Blood Glucose Monitoring Suppl (TRUE METRIX METER) w/Device KIT Check blood sugars fasting and at bedtime daily 1 kit 1   dapagliflozin propanediol (FARXIGA) 10 MG TABS tablet Take 1 tablet (10 mg total) by mouth daily before breakfast. 90 tablet 1   glucose blood (TRUE METRIX BLOOD GLUCOSE TEST) test strip Use as instructed 100 each 12   insulin glargine (LANTUS) 100 UNIT/ML injection Inject 0.5 mLs (50 Units total) into the skin daily. 190 mL 3   Insulin Pen Needle (PEN NEEDLES) 32G X 4 MM MISC 1 Dose by Does not apply route daily. 100 each 1   NEEDLE, DISP, 18 G (EASY TOUCH FLIPLOCK NEEDLES) 18G X 1" MISC use  as directed 20 each 0   NEEDLE, DISP, 18 G (EASY TOUCH FLIPLOCK NEEDLES) 18G X 1" MISC use as directed 20 each 1   NEEDLE, DISP, 18 G 18G X 1" MISC Use as directed 20 each 0   NEEDLE, DISP, 25 G (BD DISP NEEDLES) 25G X 5/8" MISC See admin instructions. 20 each 0   NEEDLE, DISP, 25 G (EASY TOUCH FLIPLOCK NEEDLES) 25G X 5/8" MISC use as directed 20 each 0   NEEDLE, DISP, 25 G 25G X 5/8" MISC Use as directed. 20 each 0   NEEDLE, DISP, 27 G (BD DISP NEEDLES) 27G X 1/2" MISC Use as directed 20 each 1   Syringe, Disposable, (B-D SYRINGE LUER-LOK 1CC) 1 ML MISC use as directed 20 each 0   Syringe, Disposable, (B-D SYRINGE LUER-LOK 1CC) 1 ML MISC use as directed 20 each 0   Syringe, Disposable, (B-D SYRINGE LUER-LOK 1CC) 1 ML MISC use as directed 20 each 0    Syringe, Disposable, (BD PLASTIPAK SYRINGE) 3 ML MISC See admin instructions. 20 each 0   testosterone cypionate (DEPOTESTOSTERONE CYPIONATE) 200 MG/ML injection Inject 0.5 mLs (100 mg total) into the skin once a week. 6 mL 0   TRUEplus Lancets 28G MISC Use as directed 3 (three) times daily before meals. 100 each 12   TUBERCULIN SYR 1CC/27GX1/2" (B-D TB SYRINGE 1CC/27GX1/2") 27G X 1/2" 1 ML MISC Use as directed. 20 each 1   tirzepatide (MOUNJARO) 10 MG/0.5ML Pen Inject 10 mg into the skin once a week. 6 mL 6   tirzepatide (MOUNJARO) 7.5 MG/0.5ML Pen Inject 7.5 mg into the skin once a week. For 4 weeks then increase to 10mg  6 mL 0   benzonatate (TESSALON) 100 MG capsule Take 1 capsule (100 mg total) by mouth every 8 (eight) hours. (Patient not taking: Reported on 09/09/2022) 21 capsule 0   insulin lispro (HUMALOG) 100 UNIT/ML KwikPen INJECT 0-12 UNITS INTO THE SKIN 3 (THREE) TIMES DAILY. ACCORDING TO SLIDING SCALE (Patient not taking: Reported on 09/09/2022) 30 mL 6   Norgestimate-Ethinyl Estradiol Triphasic (ORTHO TRI-CYCLEN LO) 0.18/0.215/0.25 MG-25 MCG tab Take 1 tablet by mouth daily. (Patient not taking: Reported on 02/17/2022) 1 Package 11   nystatin (MYCOSTATIN/NYSTOP) powder Apply 1 application topically 3 (three) times daily. (Patient not taking: Reported on 02/17/2022) 60 g 1   No facility-administered medications prior to visit.     ROS Review of Systems  Constitutional:  Negative for activity change and appetite change.  HENT:  Negative for sinus pressure and sore throat.   Respiratory:  Negative for chest tightness, shortness of breath and wheezing.   Cardiovascular:  Negative for chest pain and palpitations.  Gastrointestinal:  Negative for abdominal distention, abdominal pain and constipation.  Genitourinary: Negative.   Musculoskeletal: Negative.   Psychiatric/Behavioral:  Negative for behavioral problems and dysphoric mood.     Objective:  BP 135/83   Pulse 84   Ht 5\' 5"   (1.651 m)   Wt 299 lb 9.6 oz (135.9 kg)   SpO2 99%   BMI 49.86 kg/m      09/09/2022    8:59 AM 06/11/2022    2:47 PM 06/11/2022    2:45 PM  BP/Weight  Systolic BP [id number]   Diastolic BP 83 95   Wt. (Lbs) 299.6  302.25  BMI 49.86 kg/m2  50.3 kg/m2    Wt Readings from Last 3 Encounters:  09/09/22 299 lb 9.6 oz (135.9 kg)  06/11/22 (!) 302 lb 4 oz (137.1  kg)  05/22/22 (!) 302 lb 3.2 oz (137.1 kg)     Physical Exam Constitutional:      Appearance: He is well-developed.  Cardiovascular:     Rate and Rhythm: Normal rate.     Heart sounds: Normal heart sounds. No murmur heard. Pulmonary:     Effort: Pulmonary effort is normal.     Breath sounds: Normal breath sounds. No wheezing or rales.  Chest:     Chest wall: No tenderness.  Abdominal:     General: Bowel sounds are normal. There is no distension.     Palpations: Abdomen is soft. There is no mass.     Tenderness: There is no abdominal tenderness.  Musculoskeletal:        General: Normal range of motion.     Right lower leg: No edema.     Left lower leg: No edema.  Neurological:     Mental Status: He is alert and oriented to person, place, and time.  Psychiatric:        Mood and Affect: Mood normal.        Latest Ref Rng & Units 05/20/2022   12:51 AM 02/17/2022   12:00 PM 08/12/2021   10:11 AM  CMP  Glucose 70 - 99 mg/dL 100  109  100   BUN 6 - 20 mg/dL 7  8  9    Creatinine 0.44 - 1.00 mg/dL 0.78  0.74  0.85   Sodium 135 - 145 mmol/L 139  139  139   Potassium 3.5 - 5.1 mmol/L 3.7  4.6  4.6   Chloride 98 - 111 mmol/L 106  103  103   CO2 22 - 32 mmol/L 25  22  24    Calcium 8.9 - 10.3 mg/dL 8.9  9.1  9.0   Total Protein 6.5 - 8.1 g/dL 7.3   7.5   Total Bilirubin 0.3 - 1.2 mg/dL 0.5   0.4   Alkaline Phos 38 - 126 U/L 67   104   AST 15 - 41 U/L 45   22   ALT 0 - 44 U/L 42   20     Lipid Panel     Component Value Date/Time   CHOL 157 08/12/2021 1011   TRIG 103 08/12/2021 1011   HDL 29 (L) 08/12/2021 1011    CHOLHDL 4.2 08/01/2020 1033   LDLCALC 109 (H) 08/12/2021 1011    CBC    Component Value Date/Time   WBC 8.7 05/20/2022 0051   RBC 6.60 (H) 05/20/2022 0051   HGB 16.2 (H) 05/20/2022 0051   HGB 16.8 (H) 02/17/2022 1200   HCT 52.2 (H) 05/20/2022 0051   HCT 53.0 (H) 02/17/2022 1200   PLT 324 05/20/2022 0051   PLT 328 02/17/2022 1200   MCV 79.1 (L) 05/20/2022 0051   MCV 77 (L) 02/17/2022 1200   MCH 24.5 (L) 05/20/2022 0051   MCHC 31.0 05/20/2022 0051   RDW 17.5 (H) 05/20/2022 0051   RDW 18.1 (H) 02/17/2022 1200   LYMPHSABS 2.9 05/20/2022 0051   LYMPHSABS 2.9 02/17/2022 1200   MONOABS 0.5 05/20/2022 0051   EOSABS 0.1 05/20/2022 0051   EOSABS 0.1 02/17/2022 1200   BASOSABS 0.0 05/20/2022 0051   BASOSABS 0.0 02/17/2022 1200    Lab Results  Component Value Date   HGBA1C 7.1 (A) 09/09/2022    Assessment & Plan:  1. Type 2 diabetes mellitus with other specified complication, with long-term current use of insulin Controlled with A1c of 7.1 This  has trended up from 6.2 previously but we had discontinued glipizide at a previous visit Will increase dose of Mounjaro due to additional weight loss desired Counseled on Diabetic diet, my plate method, [id number] minutes of moderate intensity exercise/week Blood sugar logs with fasting goals of 80-120 mg/dl, random of less than 180 and in the event of sugars less than 60 mg/dl or greater than 400 mg/dl encouraged to notify the clinic. Advised on the need for annual eye exams, annual foot exams, Pneumonia vaccine. - POCT glycosylated hemoglobin (Hb A1C) - tirzepatide (MOUNJARO) 12.5 MG/0.5ML Pen; Inject 12.5 mg into the skin once a week.  Dispense: 6 mL; Refill: 6 - Microalbumin / creatinine urine ratio - LP+Non-HDL Cholesterol - CMP14+EGFR  2. Class 3 severe obesity due to excess calories without serious comorbidity with body mass index (BMI) of 50.0 to 59.9 in adult He continues to lose weight on Mounjaro Advised to remain active and to work  on caloric restriction  3. Gastroesophageal reflux disease without esophagitis This was a one-time occurrence No indication for medication at this time but advised that if symptoms recur I am happy to write a prescription for PPI Advised to avoid recumbency up to 2 hours postmeal, avoid late meals, avoid foods that trigger symptoms.    Meds ordered this encounter  Medications   tirzepatide (MOUNJARO) 12.5 MG/0.5ML Pen    Sig: Inject 12.5 mg into the skin once a week.    Dispense:  6 mL    Refill:  6    Dose increase    Follow-up: Return in about 6 months (around 03/11/2023) for Chronic medical conditions.       Charlott Rakes, MD, FAAFP. [facility] and [place], [place]   09/09/2022, 10:47 AM

## 2022-09-09 NOTE — Patient Instructions (Signed)

## 2022-09-10 LAB — LP+NON-HDL CHOLESTEROL
Cholesterol, Total: 159 mg/dL (ref 100–199)
HDL: 37 mg/dL — ABNORMAL LOW (ref >39)
LDL Chol Calc (NIH): 102 mg/dL — ABNORMAL HIGH (ref 0–99)
Total Non-HDL-Chol (LDL+VLDL): 122 mg/dL (ref 0–129)
Triglycerides: 110 mg/dL (ref 0–149)
VLDL Cholesterol Cal: 20 mg/dL (ref 5–40)

## 2022-09-10 LAB — CMP14+EGFR
ALT: 27 IU/L (ref 0–32)
AST: 29 IU/L (ref 0–40)
Albumin/Globulin Ratio: 1.4 (ref 1.2–2.2)
Albumin: 4.2 g/dL (ref 4.0–5.0)
Alkaline Phosphatase: 97 IU/L (ref 44–121)
BUN/Creatinine Ratio: 11 (ref 9–23)
BUN: 9 mg/dL (ref 6–20)
Bilirubin Total: 0.5 mg/dL (ref 0.0–1.2)
CO2: 24 mmol/L (ref 20–29)
Calcium: 9 mg/dL (ref 8.7–10.2)
Chloride: 103 mmol/L (ref 96–106)
Creatinine, Ser: 0.85 mg/dL (ref 0.57–1.00)
Globulin, Total: 3.1 g/dL (ref 1.5–4.5)
Glucose: 106 mg/dL — ABNORMAL HIGH (ref 70–99)
Potassium: 4.6 mmol/L (ref 3.5–5.2)
Sodium: 141 mmol/L (ref 134–144)
Total Protein: 7.3 g/dL (ref 6.0–8.5)
eGFR: 94 mL/min/{1.73_m2} (ref >59)

## 2022-09-10 LAB — MICROALBUMIN / CREATININE URINE RATIO
Creatinine, Urine: 100.6 mg/dL
Microalb/Creat Ratio: 10 mg/g creat (ref 0–29)
Microalbumin, Urine: 10.4 ug/mL

## 2022-09-11 ENCOUNTER — Other Ambulatory Visit: Payer: Self-pay

## 2022-09-15 ENCOUNTER — Other Ambulatory Visit: Payer: Self-pay

## 2022-09-16 ENCOUNTER — Other Ambulatory Visit: Payer: Self-pay

## 2022-09-16 ENCOUNTER — Other Ambulatory Visit (HOSPITAL_COMMUNITY): Payer: Self-pay

## 2022-09-17 ENCOUNTER — Other Ambulatory Visit (HOSPITAL_COMMUNITY): Payer: Self-pay

## 2022-09-17 MED ORDER — TESTOSTERONE CYPIONATE 200 MG/ML IM SOLN
100.0000 mg | INTRAMUSCULAR | 0 refills | Status: DC
  Filled 2022-09-17: qty 2, 14d supply, fill #0

## 2022-09-18 ENCOUNTER — Other Ambulatory Visit: Payer: Self-pay

## 2022-10-03 ENCOUNTER — Other Ambulatory Visit (HOSPITAL_COMMUNITY): Payer: Self-pay

## 2022-10-06 ENCOUNTER — Other Ambulatory Visit (HOSPITAL_COMMUNITY): Payer: Self-pay

## 2022-10-13 ENCOUNTER — Other Ambulatory Visit (HOSPITAL_COMMUNITY): Payer: Self-pay

## 2022-10-14 ENCOUNTER — Other Ambulatory Visit (HOSPITAL_COMMUNITY): Payer: Self-pay

## 2022-10-14 MED ORDER — EASY TOUCH SYRINGE BARREL 1ML MISC
1 refills | Status: AC
  Filled 2023-01-19: qty 20, fill #0
  Filled 2023-04-04: qty 20, 20d supply, fill #0

## 2022-10-14 MED ORDER — "HYPODERMIC NEEDLE 25G X 5/8"" MISC"
1 refills | Status: AC
  Filled 2023-01-19: qty 12, 84d supply, fill #0

## 2022-10-14 MED ORDER — TESTOSTERONE CYPIONATE 200 MG/ML IM SOLN
100.0000 mg | INTRAMUSCULAR | 1 refills | Status: DC
  Filled 2022-10-14: qty 6, 42d supply, fill #0
  Filled 2023-01-05 – 2023-01-16 (×2): qty 6, 42d supply, fill #1

## 2022-10-14 MED ORDER — "HYPODERMIC NEEDLE 18G X 1"" MISC"
1 refills | Status: AC
  Filled 2023-01-19: qty 20, fill #0
  Filled 2023-04-04: qty 20, 20d supply, fill #0

## 2022-10-20 ENCOUNTER — Other Ambulatory Visit: Payer: Self-pay

## 2022-11-10 ENCOUNTER — Other Ambulatory Visit: Payer: Self-pay

## 2022-11-14 ENCOUNTER — Other Ambulatory Visit: Payer: Self-pay

## 2022-11-17 ENCOUNTER — Other Ambulatory Visit: Payer: Self-pay

## 2022-12-02 ENCOUNTER — Other Ambulatory Visit: Payer: Self-pay | Admitting: Family Medicine

## 2022-12-02 DIAGNOSIS — E1165 Type 2 diabetes mellitus with hyperglycemia: Secondary | ICD-10-CM

## 2022-12-03 ENCOUNTER — Other Ambulatory Visit: Payer: Self-pay

## 2022-12-03 MED ORDER — DAPAGLIFLOZIN PROPANEDIOL 10 MG PO TABS
10.0000 mg | ORAL_TABLET | Freq: Every day | ORAL | 1 refills | Status: DC
  Filled 2022-12-03: qty 90, 90d supply, fill #0
  Filled 2023-02-22 – 2023-02-23 (×2): qty 90, 90d supply, fill #1

## 2022-12-05 ENCOUNTER — Other Ambulatory Visit: Payer: Self-pay

## 2023-01-06 ENCOUNTER — Other Ambulatory Visit: Payer: Self-pay

## 2023-01-08 ENCOUNTER — Other Ambulatory Visit: Payer: Self-pay

## 2023-01-09 ENCOUNTER — Other Ambulatory Visit: Payer: Self-pay

## 2023-01-12 ENCOUNTER — Other Ambulatory Visit (HOSPITAL_BASED_OUTPATIENT_CLINIC_OR_DEPARTMENT_OTHER): Payer: Self-pay

## 2023-01-13 ENCOUNTER — Other Ambulatory Visit: Payer: Self-pay

## 2023-01-16 ENCOUNTER — Other Ambulatory Visit: Payer: Self-pay

## 2023-01-19 ENCOUNTER — Other Ambulatory Visit (HOSPITAL_COMMUNITY): Payer: Self-pay

## 2023-01-19 ENCOUNTER — Other Ambulatory Visit: Payer: Self-pay

## 2023-01-20 ENCOUNTER — Other Ambulatory Visit (HOSPITAL_COMMUNITY): Payer: Self-pay

## 2023-01-20 ENCOUNTER — Other Ambulatory Visit: Payer: Self-pay

## 2023-01-29 ENCOUNTER — Other Ambulatory Visit (HOSPITAL_COMMUNITY): Payer: Self-pay

## 2023-02-10 ENCOUNTER — Other Ambulatory Visit: Payer: Self-pay

## 2023-02-13 ENCOUNTER — Other Ambulatory Visit: Payer: Self-pay

## 2023-02-22 ENCOUNTER — Other Ambulatory Visit: Payer: Self-pay | Admitting: Family Medicine

## 2023-02-22 DIAGNOSIS — E11649 Type 2 diabetes mellitus with hypoglycemia without coma: Secondary | ICD-10-CM

## 2023-02-23 ENCOUNTER — Other Ambulatory Visit: Payer: Self-pay

## 2023-02-24 ENCOUNTER — Other Ambulatory Visit: Payer: Self-pay | Admitting: Pharmacist

## 2023-02-24 ENCOUNTER — Other Ambulatory Visit: Payer: Self-pay

## 2023-02-24 DIAGNOSIS — E1169 Type 2 diabetes mellitus with other specified complication: Secondary | ICD-10-CM

## 2023-02-24 MED ORDER — CONTOUR NEXT EZ W/DEVICE KIT
PACK | 0 refills | Status: AC
  Filled 2023-02-24: qty 1, 30d supply, fill #0

## 2023-02-24 MED ORDER — CONTOUR TEST VI STRP
ORAL_STRIP | 0 refills | Status: DC
  Filled 2023-02-24: qty 100, 33d supply, fill #0

## 2023-02-24 MED ORDER — TRUE METRIX BLOOD GLUCOSE TEST VI STRP
ORAL_STRIP | 0 refills | Status: DC
  Filled 2023-02-24: qty 100, fill #0

## 2023-02-24 MED ORDER — MICROLET LANCETS MISC
0 refills | Status: AC
  Filled 2023-02-24: qty 100, 33d supply, fill #0

## 2023-02-24 NOTE — Telephone Encounter (Signed)
Requested Prescriptions  Pending Prescriptions Disp Refills   glucose blood (TRUE METRIX BLOOD GLUCOSE TEST) test strip 100 each 0    Sig: Use as instructed     Endocrinology: Diabetes - Testing Supplies Passed - 02/22/2023 10:24 AM      Passed - Valid encounter within last 12 months    Recent Outpatient Visits           5 months ago Type 2 diabetes mellitus with other specified complication, with long-term current use of insulin (HCC)   [place] [facility] Hoy Register, MD   9 months ago Atypical chest pain   [facility] [place], Odette Horns, MD   1 year ago Type 2 diabetes mellitus with hyperglycemia, with long-term current use of insulin [facility])   [place] [facility] [place], Odette Horns, MD   1 year ago Type 2 diabetes mellitus with hyperglycemia, with long-term current use of insulin [facility])   [place] [facility] Hoy Register, MD   1 year ago Screening for cervical cancer   [place] [facility] Hoy Register, MD       Future Appointments             In 2 weeks Hoy Register, MD [facility]

## 2023-02-25 ENCOUNTER — Other Ambulatory Visit: Payer: Self-pay

## 2023-02-27 ENCOUNTER — Other Ambulatory Visit: Payer: Self-pay

## 2023-03-03 IMAGING — DX DG CHEST 1V PORT
1 series · 1 of 1 positions shown · non-contrast
Comparison: 06/14/2020

CLINICAL DATA: Wheezing and shortness of breath.

EXAM:
PORTABLE CHEST 1 VIEW

[chest ap]
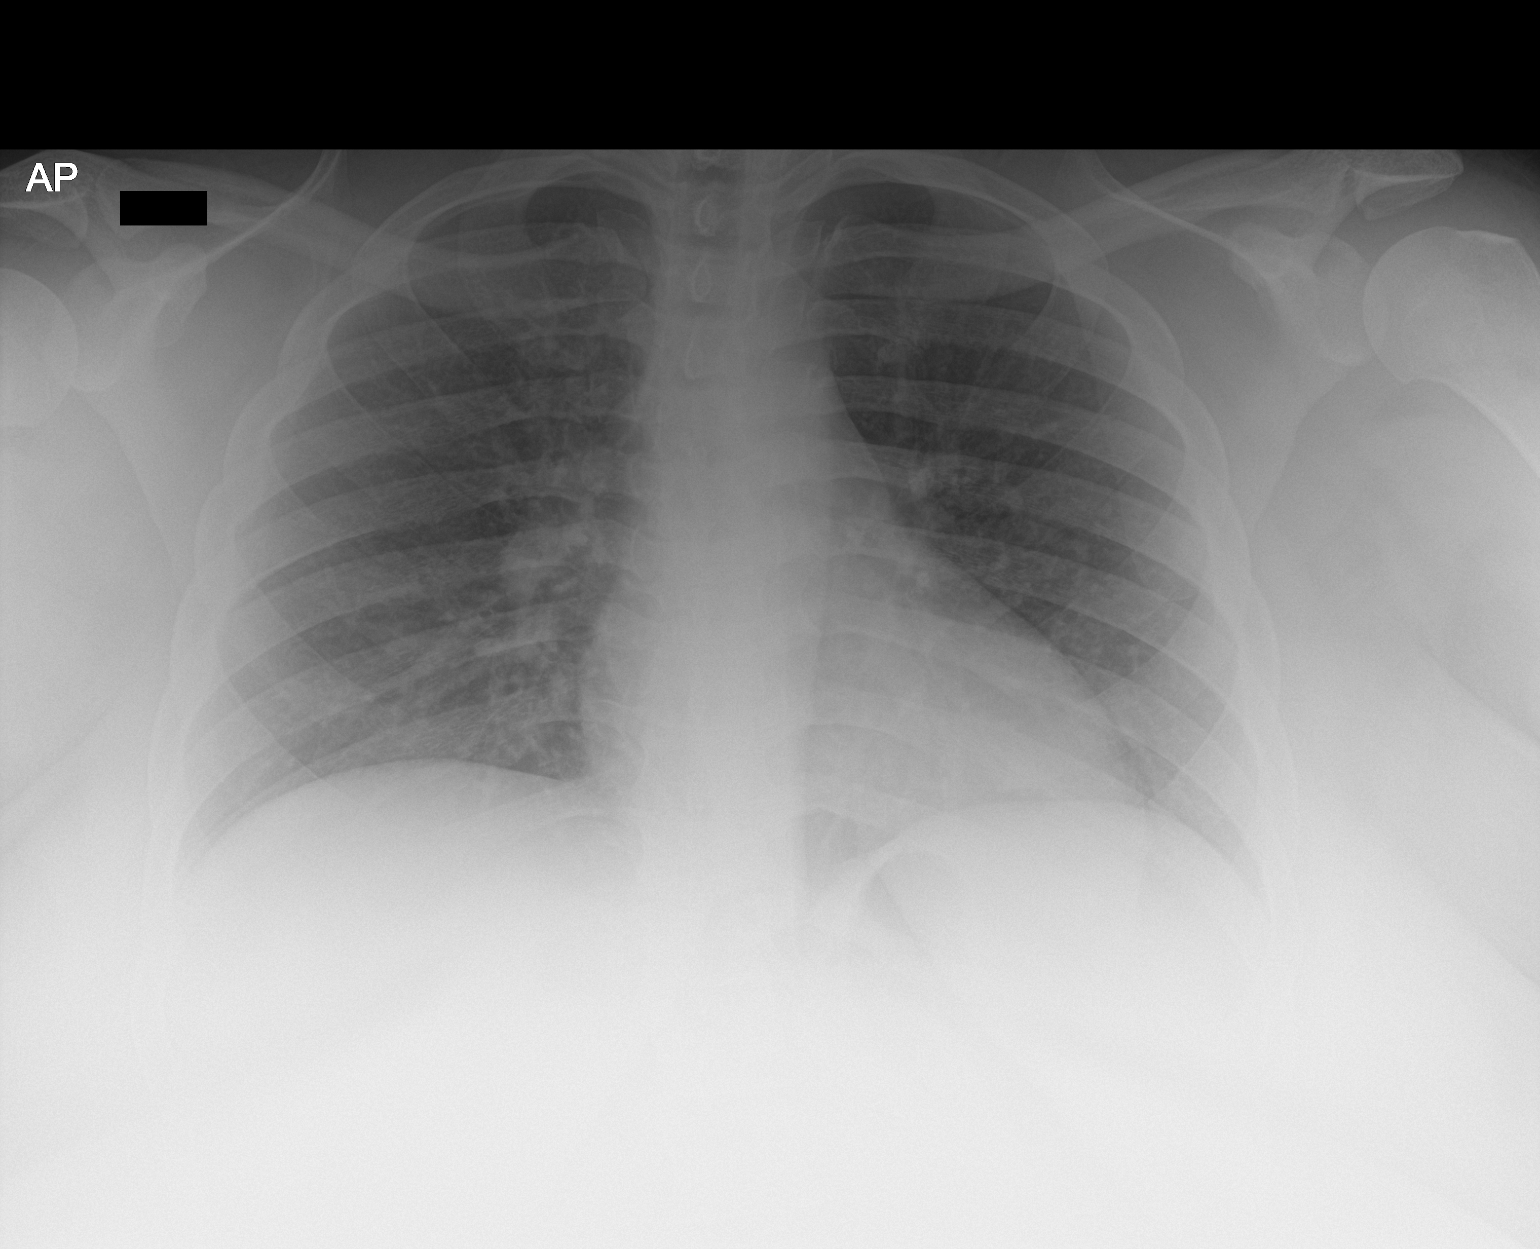

[1 of 1 positions shown; findings below may reference images not displayed]

FINDINGS: Both lungs are clear. Heart and mediastinum are within normal
limits. Negative for a pneumothorax. Trachea is midline. Bone
structures are intact.
IMPRESSION: No active disease.

## 2023-03-12 ENCOUNTER — Ambulatory Visit: Payer: 59 | Attending: Family Medicine | Admitting: Family Medicine

## 2023-03-12 ENCOUNTER — Encounter: Payer: Self-pay | Admitting: Family Medicine

## 2023-03-12 ENCOUNTER — Other Ambulatory Visit: Payer: Self-pay

## 2023-03-12 VITALS — BP 126/85 | HR 86 | Wt 289.2 lb

## 2023-03-12 DIAGNOSIS — Z7985 Long-term (current) use of injectable non-insulin antidiabetic drugs: Secondary | ICD-10-CM

## 2023-03-12 DIAGNOSIS — Z6841 Body Mass Index (BMI) 40.0 and over, adult: Secondary | ICD-10-CM

## 2023-03-12 DIAGNOSIS — Z7984 Long term (current) use of oral hypoglycemic drugs: Secondary | ICD-10-CM

## 2023-03-12 DIAGNOSIS — E1165 Type 2 diabetes mellitus with hyperglycemia: Secondary | ICD-10-CM

## 2023-03-12 DIAGNOSIS — S93402A Sprain of unspecified ligament of left ankle, initial encounter: Secondary | ICD-10-CM | POA: Diagnosis not present

## 2023-03-12 DIAGNOSIS — E1169 Type 2 diabetes mellitus with other specified complication: Secondary | ICD-10-CM

## 2023-03-12 DIAGNOSIS — Z23 Encounter for immunization: Secondary | ICD-10-CM

## 2023-03-12 DIAGNOSIS — E66813 Obesity, class 3: Secondary | ICD-10-CM

## 2023-03-12 DIAGNOSIS — Z794 Long term (current) use of insulin: Secondary | ICD-10-CM | POA: Diagnosis not present

## 2023-03-12 DIAGNOSIS — Z79890 Hormone replacement therapy (postmenopausal): Secondary | ICD-10-CM

## 2023-03-12 LAB — POCT GLYCOSYLATED HEMOGLOBIN (HGB A1C): HbA1c, POC (controlled diabetic range): 6.2 % (ref 0.0–7.0)

## 2023-03-12 LAB — GLUCOSE, POCT (MANUAL RESULT ENTRY): POC Glucose: 89 mg/dL (ref 70–99)

## 2023-03-12 MED ORDER — DICLOFENAC SODIUM 1 % EX GEL
4.0000 g | Freq: Four times a day (QID) | CUTANEOUS | 1 refills | Status: AC
  Filled 2023-03-12: qty 100, fill #0

## 2023-03-12 MED ORDER — DAPAGLIFLOZIN PROPANEDIOL 10 MG PO TABS
10.0000 mg | ORAL_TABLET | Freq: Every day | ORAL | 1 refills | Status: DC
  Filled 2023-03-12 – 2023-06-29 (×2): qty 90, 90d supply, fill #0

## 2023-03-12 MED ORDER — INSULIN GLARGINE 100 UNIT/ML ~~LOC~~ SOLN
45.0000 [IU] | Freq: Every day | SUBCUTANEOUS | 3 refills | Status: DC
  Filled 2023-03-12: qty 190, 418d supply, fill #0
  Filled 2023-06-29: qty 40, 88d supply, fill #0
  Filled 2023-09-24: qty 40, 88d supply, fill #1
  Filled 2024-01-21: qty 40, 88d supply, fill #2

## 2023-03-12 MED ORDER — TIRZEPATIDE 15 MG/0.5ML ~~LOC~~ SOAJ
15.0000 mg | SUBCUTANEOUS | 6 refills | Status: DC
  Filled 2023-03-12: qty 2, 28d supply, fill #0
  Filled 2023-04-04: qty 2, 28d supply, fill #1
  Filled 2023-05-03: qty 2, 28d supply, fill #2
  Filled 2023-05-31: qty 2, 28d supply, fill #3
  Filled 2023-06-29: qty 2, 28d supply, fill #4
  Filled 2023-07-26: qty 2, 28d supply, fill #5
  Filled 2023-08-23: qty 2, 28d supply, fill #6
  Filled 2023-09-24: qty 2, 28d supply, fill #7
  Filled 2023-10-23: qty 2, 28d supply, fill #8
  Filled 2023-11-15: qty 2, 28d supply, fill #9
  Filled 2023-12-18: qty 2, 28d supply, fill #10
  Filled 2024-01-21: qty 2, 28d supply, fill #11
  Filled 2024-03-07: qty 2, 28d supply, fill #12

## 2023-03-12 NOTE — Progress Notes (Signed)
Subjective:  Patient ID: Diana Joyce, adult    DOB: May 15, 1991  Age: 32 y.o. MRN: [id number]  CC: Diabetes and Medication Refill   HPI Diana Joyce is a 32 y.o. year old adult with a history of type 2 diabetes mellitus (A1c 6.2), asthma, morbid obesity, on testosterone therapy from [facility] for gender transition (female to female)     Interval History: Discussed the use of AI scribe software for clinical note transcription with the patient, who gave verbal consent to proceed.  He presents with a twisted ankle sustained the previous day while taking care of his wife who had surgery. The patient reports that he has been applying a wrap to the ankle but has not used any topical creams or ice. The patient also complains of an onset of pain in his left wrist, which started a few weeks ago and is exacerbated by bending the wrist. There is no associated numbness or tingling.  He is right-handed.  In addition to these musculoskeletal issues, the patient discusses his diabetes management and weight loss. He has had a weight loss of 10 pounds in the last six months, although he does not feel like he is losing weight. The patient is on multiple medications for diabetes, including Mounjaro and insulin, and is considering increasing the dose of Mounjaro.  Denies presence of hypoglycemia. The patient also mentions receiving testosterone injections from [facility].        Past Medical History:  Diagnosis Date   Apnea, sleep    Asthma    Diabetes (HCC)     Past Surgical History:  Procedure Laterality Date   FRACTURE SURGERY      Family History  Problem Relation Age of Onset   Kidney failure Mother    Hypertension Mother    Diabetes Mother    Heart failure Mother    Diabetes Father     Social History   Socioeconomic History   Marital status: Single    Spouse name: Not on file   Number of children: Not on file   Years of education: Not on file   Highest education  level: Not on file  Occupational History   Not on file  Tobacco Use   Smoking status: Former    Types: Cigars   Smokeless tobacco: Never  Vaping Use   Vaping status: Never Used  Substance and Sexual Activity   Alcohol use: Yes   Drug use: No   Sexual activity: Yes    Birth control/protection: None  Other Topics Concern   Not on file  Social History Narrative   Not on file   Social Determinants of Health   Financial Resource Strain: Not on file  Food Insecurity: Not on file  Transportation Needs: Not on file  Physical Activity: Not on file  Stress: Not on file  Social Connections: Not on file    Allergies  Allergen Reactions   Benadryl [Diphenhydramine Hcl] Other (See Comments)    Hives, coughing, itching.     Outpatient Medications Prior to Visit  Medication Sig Dispense Refill   aluminum chloride (DRYSOL) 20 % external solution Apply topically at bedtime. 35 mL 1   blood glucose meter kit and supplies KIT Dispense with test strips and lancets based on insurance preference. Use up to four times daily as directed. (FOR ICD-9 250.00, 250.01). 100 each 12   Blood Glucose Monitoring Suppl (CONTOUR NEXT EZ) w/Device KIT Use as directed. 1 kit 0   glucose blood (CONTOUR TEST)  test strip Use as directed 3 (three) times daily before meals. Dx E11.69 100 each 0   Microlet Lancets MISC Use as directed 3 (three) times daily before meals. Dx E11.69 100 each 0   Needle, Disp, (HYPODERMIC NEEDLE 18GX1") 18G X 1" MISC Use for weekly hormone injections as directed 20 each 1   Needle, Disp, (HYPODERMIC NEEDLE 25GX5/8") 25G X 5/8" MISC Use for weekly hormone injections as directed. 20 each 1   NEEDLE, DISP, 25 G (EASY TOUCH FLIPLOCK NEEDLES) 25G X 5/8" MISC use as directed 20 each 0   NEEDLE, DISP, 27 G (BD DISP NEEDLES) 27G X 1/2" MISC Use as directed 20 each 1   Needles & Syringes (EASY TOUCH SYRINGE BARREL ) MISC Use for weekly hormone injections as directed 20 each 1   nystatin  (MYCOSTATIN/NYSTOP) powder Apply 1 application topically 3 (three) times daily. 60 g 1   Syringe, Disposable, (B-D SYRINGE LUER-LOK 1CC) 1 ML MISC use as directed 20 each 0   testosterone cypionate (DEPOTESTOSTERONE CYPIONATE) 200 MG/ML injection Inject 0.5 mLs (100 mg total) into the skin once a week. 6 mL 1   TUBERCULIN SYR 1CC/27GX1/2" (B-D TB SYRINGE 1CC/27GX1/2") 27G X 1/2" 1 ML MISC Use as directed. 20 each 1   benzonatate (TESSALON) 100 MG capsule Take 1 capsule (100 mg total) by mouth every 8 (eight) hours. 21 capsule 0   dapagliflozin propanediol (FARXIGA) 10 MG TABS tablet Take 1 tablet (10 mg total) by mouth daily before breakfast. 90 tablet 1   insulin glargine (LANTUS) 100 UNIT/ML injection Inject 0.5 mLs (50 Units total) into the skin daily. 190 mL 3   insulin lispro (HUMALOG) 100 UNIT/ML KwikPen INJECT 0-12 UNITS INTO THE SKIN 3 (THREE) TIMES DAILY. ACCORDING TO SLIDING SCALE 30 mL 6   Insulin Pen Needle (PEN NEEDLES) 32G X 4 MM MISC 1 Dose by Does not apply route daily. 100 each 1   NEEDLE, DISP, 18 G (EASY TOUCH FLIPLOCK NEEDLES) 18G X 1" MISC use as directed 20 each 0   NEEDLE, DISP, 18 G (EASY TOUCH FLIPLOCK NEEDLES) 18G X 1" MISC use as directed 20 each 1   NEEDLE, DISP, 18 G 18G X 1" MISC Use as directed 20 each 0   NEEDLE, DISP, 25 G (BD DISP NEEDLES) 25G X 5/8" MISC See admin instructions. 20 each 0   NEEDLE, DISP, 25 G 25G X 5/8" MISC Use as directed. 20 each 0   Norgestimate-Ethinyl Estradiol Triphasic (ORTHO TRI-CYCLEN LO) 0.18/0.215/0.25 MG-25 MCG tab Take 1 tablet by mouth daily. 1 Package 11   Syringe, Disposable, (B-D SYRINGE LUER-LOK 1CC) 1 ML MISC use as directed 20 each 0   Syringe, Disposable, (B-D SYRINGE LUER-LOK 1CC) 1 ML MISC use as directed 20 each 0   Syringe, Disposable, (BD PLASTIPAK SYRINGE) 3 ML MISC See admin instructions. 20 each 0   tirzepatide (MOUNJARO) 12.5 MG/0.5ML Pen Inject 12.5 mg into the skin once a week. 6 mL 6   No facility-administered  medications prior to visit.     ROS Review of Systems  Constitutional:  Negative for activity change and appetite change.  HENT:  Negative for sinus pressure and sore throat.   Respiratory:  Negative for chest tightness, shortness of breath and wheezing.   Cardiovascular:  Negative for chest pain and palpitations.  Gastrointestinal:  Negative for abdominal distention, abdominal pain and constipation.  Genitourinary: Negative.   Musculoskeletal:        See HPI  Psychiatric/Behavioral:  Negative for behavioral problems and dysphoric mood.     Objective:  BP 126/85 (BP Location: Right Arm, Patient Position: Sitting, Cuff Size: Normal)   Pulse 86   Wt 289 lb 3.2 oz (131.2 kg)   SpO2 97%   BMI 48.13 kg/m      03/12/2023    9:06 AM 09/09/2022    8:59 AM 06/11/2022    2:47 PM  BP/Weight  Systolic BP 126 135 159  Diastolic BP 85 83 95  Wt. (Lbs) 289.2 299.6   BMI 48.13 kg/m2 49.86 kg/m2     Wt Readings from Last 3 Encounters:  03/12/23 289 lb 3.2 oz (131.2 kg)  09/09/22 299 lb 9.6 oz (135.9 kg)  06/11/22 (!) 302 lb 4 oz (137.1 kg)      Physical Exam Constitutional:      Appearance: He is well-developed.  Cardiovascular:     Rate and Rhythm: Normal rate.     Heart sounds: Normal heart sounds. No murmur heard. Pulmonary:     Effort: Pulmonary effort is normal.     Breath sounds: Normal breath sounds. No wheezing or rales.  Chest:     Chest wall: No tenderness.  Abdominal:     General: Bowel sounds are normal. There is no distension.     Palpations: Abdomen is soft. There is no mass.     Tenderness: There is no abdominal tenderness.  Musculoskeletal:        General: Normal range of motion.     Right lower leg: No edema.     Left lower leg: No edema.     Comments: Slight edema of left lateral malleolus with associated tenderness on inversion and range of motion Right ankle is normal. Tenderness on left stylus process of radius and on flexion of left wrist Right wrist  is normal  Neurological:     Mental Status: He is alert and oriented to person, place, and time.  Psychiatric:        Mood and Affect: Mood normal.     Diabetic Foot Exam - Simple   Simple Foot Form Diabetic Foot exam was performed with the following findings: Yes 03/12/2023  9:36 AM  Visual Inspection No deformities, no ulcerations, no other skin breakdown bilaterally: Yes Sensation Testing Intact to touch and monofilament testing bilaterally: Yes Pulse Check Posterior Tibialis and Dorsalis pulse intact bilaterally: Yes Comments        Latest Ref Rng & Units 09/09/2022   10:31 AM 05/20/2022   12:51 AM 02/17/2022   12:00 PM  CMP  Glucose 70 - 99 mg/dL 161  096  045   BUN 6 - 20 mg/dL 9  7  8    Creatinine 0.57 - 1.00 mg/dL 4.09  8.11  9.14   Sodium 134 - 144 mmol/L 141  139  139   Potassium 3.5 - 5.2 mmol/L 4.6  3.7  4.6   Chloride 96 - 106 mmol/L 103  106  103   CO2 20 - 29 mmol/L 24  25  22    Calcium 8.7 - 10.2 mg/dL 9.0  8.9  9.1   Total Protein 6.0 - 8.5 g/dL 7.3  7.3    Total Bilirubin 0.0 - 1.2 mg/dL 0.5  0.5    Alkaline Phos 44 - 121 IU/L 97  67    AST 0 - 40 IU/L 29  45    ALT 0 - 32 IU/L 27  42      Lipid Panel  Component Value Date/Time   CHOL 159 09/09/2022 1031   TRIG 110 09/09/2022 1031   HDL 37 (L) 09/09/2022 1031   CHOLHDL 4.2 08/01/2020 1033   LDLCALC 102 (H) 09/09/2022 1031    CBC    Component Value Date/Time   WBC 8.7 05/20/2022 0051   RBC 6.60 (H) 05/20/2022 0051   HGB 16.2 (H) 05/20/2022 0051   HGB 16.8 (H) 02/17/2022 1200   HCT 52.2 (H) 05/20/2022 0051   HCT 53.0 (H) 02/17/2022 1200   PLT 324 05/20/2022 0051   PLT 328 02/17/2022 1200   MCV 79.1 (L) 05/20/2022 0051   MCV 77 (L) 02/17/2022 1200   MCH 24.5 (L) 05/20/2022 0051   MCHC 31.0 05/20/2022 0051   RDW 17.5 (H) 05/20/2022 0051   RDW 18.1 (H) 02/17/2022 1200   LYMPHSABS 2.9 05/20/2022 0051   LYMPHSABS 2.9 02/17/2022 1200   MONOABS 0.5 05/20/2022 0051   EOSABS 0.1 05/20/2022  0051   EOSABS 0.1 02/17/2022 1200   BASOSABS 0.0 05/20/2022 0051   BASOSABS 0.0 02/17/2022 1200    Lab Results  Component Value Date   HGBA1C 6.2 03/12/2023    Assessment & Plan:      Ankle Sprain Acute left ankle sprain from a fall yesterday. No topical treatment applied yet. -Apply Voltaren gel topically to the ankle for pain and inflammation.  Wrist Pain He is unsure if he slept wrong as he woke up 1 morning with pain. Exacerbated by bending. No numbness or tingling. -Apply Voltaren gel topically to the wrist for pain and inflammation.  Type 2 Diabetes Mellitus A1c of 6.2, down from 7.1. Currently on Mounjaro 12.5mg , Farxiga, and Lantus 50 units. -Increase Mounjaro to 15mg . -Reduce Lantus to 45 units to avoid hypoglycemia. -Continue Farxiga. -Counseled on Diabetic diet, my plate method, 811 minutes of moderate intensity exercise/week Blood sugar logs with fasting goals of 80-120 mg/dl, random of less than 914 and in the event of sugars less than 60 mg/dl or greater than 782 mg/dl encouraged to notify the clinic. Advised on the need for annual eye exams, annual foot exams, Pneumonia vaccine.   Obesity Weight loss of 10 pounds in the last six months. Patient expressed interest in weight loss surgery. -Continue current weight loss efforts. -He would like to consider referral for weight loss surgery if patient decides to pursue this option.  General Health Maintenance -Administer influenza vaccine today. -Order complete blood count, kidney function tests, and liver function tests. -Perform foot exam.          Meds ordered this encounter  Medications   diclofenac Sodium (VOLTAREN) 1 % GEL    Sig: Apply 4 g topically 4 (four) times daily.    Dispense:  100 g    Refill:  1   tirzepatide (MOUNJARO) 15 MG/0.5ML Pen    Sig: Inject 15 mg into the skin once a week.    Dispense:  6 mL    Refill:  6    Dose increase   insulin glargine (LANTUS) 100 UNIT/ML injection     Sig: Inject 0.45 mLs (45 Units total) into the skin daily.    Dispense:  190 mL    Refill:  3    Dose decrease   dapagliflozin propanediol (FARXIGA) 10 MG TABS tablet    Sig: Take 1 tablet (10 mg total) by mouth daily before breakfast.    Dispense:  90 tablet    Refill:  1    Follow-up: Return in about 6 months (  around 09/10/2023) for Chronic medical conditions.       Hoy Register, MD, FAAFP. [facility] [place], [place] [phone]   03/12/2023, 1:54 PM

## 2023-03-12 NOTE — Patient Instructions (Signed)

## 2023-03-13 LAB — CBC WITH DIFFERENTIAL/PLATELET
Basophils Absolute: 0 10*3/uL (ref 0.0–0.2)
Basos: 0 %
EOS (ABSOLUTE): 0.2 10*3/uL (ref 0.0–0.4)
Eos: 3 %
Hematocrit: 52.9 % — ABNORMAL HIGH (ref 34.0–46.6)
Hemoglobin: 17 g/dL — ABNORMAL HIGH (ref 11.1–15.9)
Immature Grans (Abs): 0 10*3/uL (ref 0.0–0.1)
Immature Granulocytes: 0 %
Lymphocytes Absolute: 2.9 10*3/uL (ref 0.7–3.1)
Lymphs: 38 %
MCH: 26.6 pg (ref 26.6–33.0)
MCHC: 32.1 g/dL (ref 31.5–35.7)
MCV: 83 fL (ref 79–97)
Monocytes Absolute: 0.5 10*3/uL (ref 0.1–0.9)
Monocytes: 7 %
Neutrophils Absolute: 4 10*3/uL (ref 1.4–7.0)
Neutrophils: 52 %
Platelets: 286 10*3/uL (ref 150–450)
RBC: 6.38 x10E6/uL — ABNORMAL HIGH (ref 3.77–5.28)
RDW: 14.9 % (ref 11.7–15.4)
WBC: 7.7 10*3/uL (ref 3.4–10.8)

## 2023-03-13 LAB — CMP14+EGFR
ALT: 31 IU/L (ref 0–32)
AST: 29 IU/L (ref 0–40)
Albumin: 4.1 g/dL (ref 4.0–5.0)
Alkaline Phosphatase: 89 IU/L (ref 44–121)
BUN/Creatinine Ratio: 8 — ABNORMAL LOW (ref 9–23)
BUN: 7 mg/dL (ref 6–20)
Bilirubin Total: 0.6 mg/dL (ref 0.0–1.2)
CO2: 24 mmol/L (ref 20–29)
Calcium: 8.9 mg/dL (ref 8.7–10.2)
Chloride: 103 mmol/L (ref 96–106)
Creatinine, Ser: 0.87 mg/dL (ref 0.57–1.00)
Globulin, Total: 2.9 g/dL (ref 1.5–4.5)
Glucose: 67 mg/dL — ABNORMAL LOW (ref 70–99)
Potassium: 4 mmol/L (ref 3.5–5.2)
Sodium: 140 mmol/L (ref 134–144)
Total Protein: 7 g/dL (ref 6.0–8.5)
eGFR: 92 mL/min/1.73

## 2023-04-04 ENCOUNTER — Other Ambulatory Visit (HOSPITAL_COMMUNITY): Payer: Self-pay

## 2023-04-06 ENCOUNTER — Encounter (HOSPITAL_COMMUNITY): Payer: Self-pay

## 2023-04-06 ENCOUNTER — Other Ambulatory Visit (HOSPITAL_COMMUNITY): Payer: Self-pay

## 2023-04-06 ENCOUNTER — Other Ambulatory Visit: Payer: Self-pay

## 2023-04-06 MED ORDER — TESTOSTERONE CYPIONATE 200 MG/ML IM SOLN
100.0000 mg | INTRAMUSCULAR | 0 refills | Status: AC
  Filled 2023-04-06: qty 2, 14d supply, fill #0

## 2023-04-07 ENCOUNTER — Encounter (HOSPITAL_COMMUNITY): Payer: Self-pay

## 2023-04-07 ENCOUNTER — Other Ambulatory Visit (HOSPITAL_COMMUNITY): Payer: Self-pay

## 2023-04-10 ENCOUNTER — Other Ambulatory Visit (HOSPITAL_COMMUNITY): Payer: Self-pay

## 2023-05-04 ENCOUNTER — Other Ambulatory Visit (HOSPITAL_COMMUNITY): Payer: Self-pay

## 2023-05-04 ENCOUNTER — Other Ambulatory Visit: Payer: Self-pay

## 2023-06-29 ENCOUNTER — Other Ambulatory Visit: Payer: Self-pay

## 2023-06-30 ENCOUNTER — Other Ambulatory Visit: Payer: Self-pay

## 2023-07-30 ENCOUNTER — Other Ambulatory Visit: Payer: Self-pay

## 2023-07-31 ENCOUNTER — Other Ambulatory Visit: Payer: Self-pay

## 2023-08-14 ENCOUNTER — Other Ambulatory Visit: Payer: Self-pay

## 2023-08-14 ENCOUNTER — Encounter: Payer: Self-pay | Admitting: Family Medicine

## 2023-08-14 ENCOUNTER — Other Ambulatory Visit: Payer: Self-pay | Admitting: Family Medicine

## 2023-08-14 MED ORDER — ALBUTEROL SULFATE HFA 108 (90 BASE) MCG/ACT IN AERS
2.0000 | INHALATION_SPRAY | Freq: Four times a day (QID) | RESPIRATORY_TRACT | 2 refills | Status: AC | PRN
  Filled 2023-08-14: qty 8.5, 25d supply, fill #0

## 2023-08-15 ENCOUNTER — Ambulatory Visit
Admission: EM | Admit: 2023-08-15 | Discharge: 2023-08-15 | Disposition: A | Discharge status: Home / Self Care | Attending: Internal Medicine | Admitting: Internal Medicine

## 2023-08-15 DIAGNOSIS — Z113 Encounter for screening for infections with a predominantly sexual mode of transmission: Secondary | ICD-10-CM | POA: Diagnosis present

## 2023-08-15 DIAGNOSIS — J069 Acute upper respiratory infection, unspecified: Secondary | ICD-10-CM | POA: Insufficient documentation

## 2023-08-15 DIAGNOSIS — J4521 Mild intermittent asthma with (acute) exacerbation: Secondary | ICD-10-CM | POA: Insufficient documentation

## 2023-08-15 DIAGNOSIS — N898 Other specified noninflammatory disorders of vagina: Secondary | ICD-10-CM | POA: Insufficient documentation

## 2023-08-15 LAB — POCT URINE PREGNANCY: Preg Test, Ur: NEGATIVE

## 2023-08-15 MED ORDER — PREDNISONE 20 MG PO TABS
40.0000 mg | ORAL_TABLET | Freq: Every day | ORAL | 0 refills | Status: AC
Start: 2023-08-15 — End: 2023-08-20

## 2023-08-15 NOTE — ED Provider Notes (Addendum)
 [facility]    CSN: [id number] Arrival date & time: 08/15/23  0803      History   Chief Complaint Chief Complaint  Patient presents with   Otalgia   Shortness of Breath   Cough   Vaginal Discharge    HPI Diana Joyce is a 33 y.o. adult.   Patient presents today with 2 different chief complaints.  Reports that they have been having left-sided ear pain, cough, nasal congestion, shortness of breath that started about 6 days ago.  Reports history of asthma which rarely flares up.  Patient notified PCP of symptoms and was sent an albuterol inhaler.  Although, patient reports they have not yet picked this up and have been using their significant other's inhaler with improvement.  Denies any obvious fever.  Reports known sick contacts at work with similar symptoms.  Patient also requesting STD testing today as they have been having vaginal discharge for the past 2 days.  Reports it is clear to yellow in color.  Denies any exposure to STD.  Although, patient reports that they have been trying to have a child, so they used a coworker's semen sample and inserted it into themselves.  Patient is biologically a female.  Last menstrual cycle was around 07/24/23.   Otalgia Shortness of Breath Cough Vaginal Discharge   Past Medical History:  Diagnosis Date   Apnea, sleep    Asthma    Diabetes (HCC)     Patient Active Problem List   Diagnosis Date Noted   Long-term current use of testosterone cypionate 03/12/2023   Tobacco abuse 03/06/2020   Sleep apnea 12/07/2017   Class 3 severe obesity due to excess calories without serious comorbidity with body mass index (BMI) of 50.0 to 59.9 in adult [facility]) 06/19/2017   Diabetes (HCC)     Past Surgical History:  Procedure Laterality Date   FRACTURE SURGERY      OB History   No obstetric history on file.      Home Medications    Prior to Admission medications   Medication Sig Start Date End Date Taking? Authorizing  Provider  predniSONE (DELTASONE) 20 MG tablet Take 2 tablets (40 mg total) by mouth daily for 5 days. 08/15/23 08/20/23 Yes Lua Feng, Acie Fredrickson, FNP  albuterol (VENTOLIN HFA) 108 (90 Base) MCG/ACT inhaler Inhale 2 puffs into the lungs every 6 (six) hours as needed for wheezing or shortness of breath. 08/14/23   Hoy Register, MD  aluminum chloride (DRYSOL) 20 % external solution Apply topically at bedtime. 03/06/20   Hoy Register, MD  blood glucose meter kit and supplies KIT Dispense with test strips and lancets based on insurance preference. Use up to four times daily as directed. (FOR ICD-9 250.00, 250.01). 12/07/17   Hoy Register, MD  Blood Glucose Monitoring Suppl (CONTOUR NEXT EZ) w/Device KIT Use as directed. 02/24/23   Hoy Register, MD  dapagliflozin propanediol (FARXIGA) 10 MG TABS tablet Take 1 tablet (10 mg total) by mouth daily before breakfast. 03/12/23   Hoy Register, MD  diclofenac Sodium (VOLTAREN) 1 % GEL Apply 4 g topically 4 (four) times daily. 03/12/23   Hoy Register, MD  glucose blood (CONTOUR TEST) test strip Use as directed 3 (three) times daily before meals. Dx E11.69 02/24/23   Hoy Register, MD  insulin glargine (LANTUS) 100 UNIT/ML injection Inject 0.45 mLs (45 Units total) into the skin daily. 03/12/23   Hoy Register, MD  Microlet Lancets MISC Use as directed 3 (three) times daily  before meals. Dx E11.69 02/24/23   Hoy Register, MD  Needle, Disp, (HYPODERMIC NEEDLE 18GX1") 18G X 1" MISC Use for weekly hormone injections as directed 10/13/22     Needle, Disp, (HYPODERMIC NEEDLE 25GX5/8") 25G X 5/8" MISC Use for weekly hormone injections as directed. 10/13/22     NEEDLE, DISP, 25 G (EASY TOUCH FLIPLOCK NEEDLES) 25G X 5/8" MISC use as directed 09/04/21     NEEDLE, DISP, 27 G (BD DISP NEEDLES) 27G X 1/2" MISC Use as directed 04/01/22     Needles & Syringes (EASY TOUCH SYRINGE BARREL ) MISC Use for weekly hormone injections as directed 10/13/22     nystatin (MYCOSTATIN/NYSTOP)  powder Apply 1 application topically 3 (three) times daily. 03/06/20   Hoy Register, MD  Syringe, Disposable, (B-D SYRINGE LUER-LOK 1CC) 1 ML MISC use as directed 09/04/21     testosterone cypionate (DEPOTESTOSTERONE CYPIONATE) 200 MG/ML injection Inject 0.5 mLs (100 mg total) into the skin once a week. 04/06/23     tirzepatide (MOUNJARO) 15 MG/0.5ML Pen Inject 15 mg into the skin once a week. 03/12/23   Hoy Register, MD  TUBERCULIN SYR 1CC/27GX1/2" (B-D TB SYRINGE 1CC/27GX1/2") 27G X 1/2" 1 ML MISC Use as directed. 04/01/22       Family History Family History  Problem Relation Age of Onset   Kidney failure Mother    Hypertension Mother    Diabetes Mother    Heart failure Mother    Diabetes Father     Social History Social History   Tobacco Use   Smoking status: Former    Types: Cigars   Smokeless tobacco: Never  Vaping Use   Vaping status: Never Used  Substance Use Topics   Alcohol use: Yes   Drug use: No     Allergies   Benadryl [diphenhydramine hcl]   Review of Systems Review of Systems Per HPI  Physical Exam Triage Vital Signs ED Triage Vitals  Encounter Vitals Group     BP 08/15/23 0819 125/83     Systolic BP Percentile --      Diastolic BP Percentile --      Pulse Rate 08/15/23 0819 82     Resp 08/15/23 0819 16     Temp 08/15/23 0819 97.9 F (36.6 C)     Temp Source 08/15/23 0819 Oral     SpO2 08/15/23 0819 97 %     Weight --      Height --      Head Circumference --      Peak Flow --      Pain Score 08/15/23 0818 7     Pain Loc --      Pain Education --      Exclude from Growth Chart --    No data found.  Updated Vital Signs BP 125/83 (BP Location: Left Arm)   Pulse 82   Temp 97.9 F (36.6 C) (Oral)   Resp 16   LMP 07/24/2023   SpO2 97%   Visual Acuity Right Eye Distance:   Left Eye Distance:   Bilateral Distance:    Right Eye Near:   Left Eye Near:    Bilateral Near:     Physical Exam Constitutional:      General: He is  not in acute distress.    Appearance: Normal appearance. He is not toxic-appearing or diaphoretic.  HENT:     Head: Normocephalic and atraumatic.     Right Ear: Tympanic membrane and ear canal normal.  Left Ear: Tympanic membrane and ear canal normal.     Nose: Congestion present.     Mouth/Throat:     Mouth: Mucous membranes are moist.     Pharynx: No posterior oropharyngeal erythema.  Eyes:     Extraocular Movements: Extraocular movements intact.     Conjunctiva/sclera: Conjunctivae normal.     Pupils: Pupils are equal, round, and reactive to light.  Cardiovascular:     Rate and Rhythm: Normal rate and regular rhythm.     Pulses: Normal pulses.     Heart sounds: Normal heart sounds.  Pulmonary:     Effort: Pulmonary effort is normal. No respiratory distress.     Breath sounds: Normal breath sounds. No stridor. No wheezing, rhonchi or rales.  Genitourinary:    Comments: Deferred with shared decision making. Self swab performed.  Musculoskeletal:        General: Normal range of motion.     Cervical back: Normal range of motion.  Skin:    General: Skin is warm and dry.  Neurological:     General: No focal deficit present.     Mental Status: He is alert and oriented to person, place, and time. Mental status is at baseline.  Psychiatric:        Mood and Affect: Mood normal.        Behavior: Behavior normal.      UC Treatments / Results  Labs (all labs ordered are listed, but only abnormal results are displayed) Labs Reviewed  POCT URINE PREGNANCY - Normal  CERVICOVAGINAL ANCILLARY ONLY    EKG   Radiology No results found.  Procedures Procedures (including critical care time)  Medications Ordered in UC Medications - No data to display  Initial Impression / Assessment and Plan / UC Course  I have reviewed the triage vital signs and the nursing notes.  Pertinent labs & imaging results that were available during my care of the patient were reviewed by me and  considered in my medical decision making (see chart for details).     1.  Viral illness  Differential diagnoses include acute bronchitis versus mild asthma exacerbation due to viral illness.  There are no adventitious lung sounds on exam, oxygen is normal, and no tachypnea so do not think that emergent evaluation is necessary.  Patient encouraged to pick up albuterol inhaler and to use this as needed.  Given duration of symptoms and shortness of breath, will treat with prednisone steroid as patient has taken this before and tolerated well.  Patient does have a history of diabetes but reports blood sugars are typically normal.  Encouraged patient to monitor glucose closely while on prednisone and was educated on the risks of prednisone and increases in blood glucose.  Advised patient to follow-up if symptoms persist or worsen. Patient no longer takes testosterone so prednisone should be safe.   2.  Vaginal discharge  Pregnancy test was negative.  Cervicovaginal swab pending.  Awaiting results.  Patient verbalized understanding and was agreeable with plan. Final Clinical Impressions(s) / UC Diagnoses   Final diagnoses:  Viral upper respiratory tract infection with cough  Mild intermittent asthma with acute exacerbation  Vaginal discharge  Screening examination for venereal disease     Discharge Instructions      I have prescribed you prednisone to take for respiratory symptoms and cough.  Continue albuterol inhaler.  Pregnancy test was negative.  Vaginal swab pending.    ED Prescriptions     Medication Sig Dispense Auth.  Provider   predniSONE (DELTASONE) 20 MG tablet Take 2 tablets (40 mg total) by mouth daily for 5 days. 10 tablet Gustavus Bryant, [place]      PDMP not reviewed this encounter.   Gustavus Bryant, [place] 08/15/23 0915    Gustavus Bryant, [place] 08/15/23 [phone]

## 2023-08-15 NOTE — ED Triage Notes (Signed)
 Patient presents to [facility] for left ear discomfort, cough, nausea, stuffy nose since Sunday. Also concerned with vaginal discharge. Req STD screening. Treating respiratory symptoms with tylenol and theraflu.

## 2023-08-15 NOTE — Discharge Instructions (Signed)
 I have prescribed you prednisone to take for respiratory symptoms and cough.  Continue albuterol inhaler.  Pregnancy test was negative.  Vaginal swab pending.

## 2023-08-17 LAB — CERVICOVAGINAL ANCILLARY ONLY
Bacterial Vaginitis (gardnerella): NEGATIVE
Candida Glabrata: NEGATIVE
Candida Vaginitis: NEGATIVE
Chlamydia: NEGATIVE
Comment: NEGATIVE
Comment: NEGATIVE
Comment: NEGATIVE
Comment: NEGATIVE
Comment: NEGATIVE
Comment: NORMAL
Neisseria Gonorrhea: NEGATIVE
Trichomonas: POSITIVE — AB

## 2023-08-18 ENCOUNTER — Other Ambulatory Visit: Payer: Self-pay

## 2023-08-18 ENCOUNTER — Telehealth: Payer: Self-pay | Admitting: Emergency Medicine

## 2023-08-18 MED ORDER — METRONIDAZOLE 500 MG PO TABS: 500.0000 mg | ORAL_TABLET | Freq: Two times a day (BID) | ORAL | 0 refills | Status: DC

## 2023-08-18 NOTE — Telephone Encounter (Signed)
 Pt called requesting results; results given positive for trichomonas; pt verbalized understanding and treatment sent to pharmacy

## 2023-08-28 ENCOUNTER — Other Ambulatory Visit: Payer: Self-pay

## 2023-09-10 ENCOUNTER — Ambulatory Visit: Payer: 59 | Attending: Family Medicine | Admitting: Family Medicine

## 2023-09-10 ENCOUNTER — Encounter: Payer: Self-pay | Admitting: Family Medicine

## 2023-09-10 ENCOUNTER — Other Ambulatory Visit: Payer: Self-pay

## 2023-09-10 VITALS — BP 123/85 | HR 77 | Ht 65.0 in | Wt 281.0 lb

## 2023-09-10 DIAGNOSIS — Z23 Encounter for immunization: Secondary | ICD-10-CM

## 2023-09-10 DIAGNOSIS — Z7985 Long-term (current) use of injectable non-insulin antidiabetic drugs: Secondary | ICD-10-CM

## 2023-09-10 DIAGNOSIS — Z7984 Long term (current) use of oral hypoglycemic drugs: Secondary | ICD-10-CM | POA: Diagnosis not present

## 2023-09-10 DIAGNOSIS — E119 Type 2 diabetes mellitus without complications: Secondary | ICD-10-CM | POA: Diagnosis not present

## 2023-09-10 DIAGNOSIS — Z6841 Body Mass Index (BMI) 40.0 and over, adult: Secondary | ICD-10-CM

## 2023-09-10 DIAGNOSIS — Z794 Long term (current) use of insulin: Secondary | ICD-10-CM | POA: Diagnosis not present

## 2023-09-10 DIAGNOSIS — F17200 Nicotine dependence, unspecified, uncomplicated: Secondary | ICD-10-CM

## 2023-09-10 DIAGNOSIS — E1165 Type 2 diabetes mellitus with hyperglycemia: Secondary | ICD-10-CM

## 2023-09-10 DIAGNOSIS — Z716 Tobacco abuse counseling: Secondary | ICD-10-CM

## 2023-09-10 DIAGNOSIS — E1169 Type 2 diabetes mellitus with other specified complication: Secondary | ICD-10-CM

## 2023-09-10 DIAGNOSIS — E66813 Obesity, class 3: Secondary | ICD-10-CM

## 2023-09-10 LAB — POCT GLYCOSYLATED HEMOGLOBIN (HGB A1C): HbA1c, POC (controlled diabetic range): 5.7 % (ref 0.0–7.0)

## 2023-09-10 MED ORDER — DAPAGLIFLOZIN PROPANEDIOL 10 MG PO TABS
10.0000 mg | ORAL_TABLET | Freq: Every day | ORAL | 1 refills | Status: DC
  Filled 2023-09-10 – 2023-09-24 (×2): qty 90, 90d supply, fill #0
  Filled 2024-01-21: qty 90, 90d supply, fill #1

## 2023-09-10 NOTE — Patient Instructions (Signed)
 VISIT SUMMARY:  You came in today for a routine follow-up visit. We discussed your recent smoking cessation, your diabetes management, and your interest in weight loss surgery. We also reviewed your current medications and lifestyle habits.  YOUR PLAN:  -TYPE 2 DIABETES MELLITUS: Your A1c level is at 5.7, which indicates excellent control of your diabetes. We discussed the possibility of transitioning from insulin to oral medications in the future. For now, continue taking your insulin glargine (Lantus) at 45 units daily. We may consider reducing your insulin dose and starting an oral medication if you agree. Please continue to monitor your A1c and blood glucose levels regularly.  -MORBID OBESITY: You have experienced weight gain since quitting smoking. We discussed the impact of dietary habits, especially fast food, on your weight. Continue taking Tirzepatide (Mounjaro) at 15 mg weekly. I encourage you to meal prep and make healthier food choices. We will also consider a referral to a bariatric surgeon for a weight loss intervention.  -SMOKING CESSATION: You have successfully quit smoking, which is a significant achievement. However, you have noticed an increase in appetite and weight gain. We discussed the option of using Wellbutrin to help manage cravings and appetite if they become unmanageable.  -BARIATRIC SURGERY EVALUATION: You expressed interest in weight loss surgery. We will refer you to a bariatric surgeon for an evaluation to see if this is a suitable option for you.  -GENERAL HEALTH MAINTENANCE: We will draw routine labs today and review the results with you. Please schedule a follow-up visit in 6 months. Additionally, we will refer you for an eye exam and administer the pneumonia vaccine today.  INSTRUCTIONS:  Please schedule a follow-up visit in 6 months. We will draw your labs today and review the results with you. Additionally, you will be referred for an eye exam and receive the  pneumonia vaccine today.

## 2023-09-10 NOTE — Progress Notes (Signed)
 Subjective:  Patient ID: Diana Joyce, adult    DOB: Nov 04, 1990  Age: 33 y.o. MRN: [id number]  CC: Medical Management of Chronic Issues (Weight loss/Stopped smoking 08/13/23/)     Discussed the use of AI scribe software for clinical note transcription with the patient, who gave verbal consent to proceed.  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  He reports recently quitting smoking, which he accomplished "cold [place]." Since quitting, he has noticed an increase in his appetite and a subsequent weight gain of about 9 lbs. Despite this, he has been making efforts to maintain his weight, including going to the gym and attempting to juice. However, he notes that his work schedule has led him to consume more fast food recently, which he is not happy about. He expresses interest in meal prepping to help control his diet better.  The patient also discusses his diabetes management, noting that he takes his Mounjaro shot every Saturday. He reports that for the first couple of days after the shot, he does not feel very hungry. He expresses some concern about his dependence on insulin and inquires about the possibility of transitioning to oral medications. He also expresses interest in weight loss surgery as a potential option for managing his weight and improving his health.    Past Medical History:  Diagnosis Date   Apnea, sleep    Asthma    Diabetes (HCC)     Past Surgical History:  Procedure Laterality Date   FRACTURE SURGERY      Family History  Problem Relation Age of Onset   Kidney failure Mother    Hypertension Mother    Diabetes Mother    Heart failure Mother    Diabetes Father     Social History   Socioeconomic History   Marital status: Single    Spouse name: Not on file   Number of  children: Not on file   Years of education: Not on file   Highest education level: Not on file  Occupational History   Not on file  Tobacco Use   Smoking status: Former    Types: Cigars   Smokeless tobacco: Never  Vaping Use   Vaping status: Never Used  Substance and Sexual Activity   Alcohol use: Yes   Drug use: No   Sexual activity: Yes    Birth control/protection: None  Other Topics Concern   Not on file  Social History Narrative   Not on file   Social Drivers of Health   Financial Resource Strain: Not on file  Food Insecurity: Not on file  Transportation Needs: Not on file  Physical Activity: Not on file  Stress: Not on file  Social Connections: Not on file    Allergies  Allergen Reactions   Benadryl [Diphenhydramine Hcl] Other (See Comments)    Hives, coughing, itching.     Outpatient Medications Prior to Visit  Medication Sig Dispense Refill   albuterol (VENTOLIN HFA) 108 (90 Base) MCG/ACT inhaler Inhale 2 puffs into the lungs every 6 (six) hours as needed for wheezing or shortness of breath. 8.5 g 2   aluminum chloride (DRYSOL) 20 % external solution Apply topically at bedtime. 35 mL 1   blood glucose meter kit and supplies KIT Dispense with test strips and lancets based on insurance preference. Use up to four times  daily as directed. (FOR ICD-9 250.00, 250.01). 100 each 12   Blood Glucose Monitoring Suppl (CONTOUR NEXT EZ) w/Device KIT Use as directed. 1 kit 0   diclofenac Sodium (VOLTAREN) 1 % GEL Apply 4 g topically 4 (four) times daily. 100 g 1   glucose blood (CONTOUR TEST) test strip Use as directed 3 (three) times daily before meals. Dx E11.69 100 each 0   insulin glargine (LANTUS) 100 UNIT/ML injection Inject 0.45 mLs (45 Units total) into the skin daily. 190 mL 3   metroNIDAZOLE (FLAGYL) 500 MG tablet Take 1 tablet (500 mg total) by mouth 2 (two) times daily. 14 tablet 0   Microlet Lancets MISC Use as directed 3 (three) times daily before meals. Dx  E11.69 100 each 0   Needle, Disp, (HYPODERMIC NEEDLE 18GX1") 18G X 1" MISC Use for weekly hormone injections as directed 20 each 1   Needle, Disp, (HYPODERMIC NEEDLE 25GX5/8") 25G X 5/8" MISC Use for weekly hormone injections as directed. 20 each 1   NEEDLE, DISP, 25 G (EASY TOUCH FLIPLOCK NEEDLES) 25G X 5/8" MISC use as directed 20 each 0   NEEDLE, DISP, 27 G (BD DISP NEEDLES) 27G X 1/2" MISC Use as directed 20 each 1   Needles & Syringes (EASY TOUCH SYRINGE BARREL ) MISC Use for weekly hormone injections as directed 20 each 1   nystatin (MYCOSTATIN/NYSTOP) powder Apply 1 application topically 3 (three) times daily. 60 g 1   Syringe, Disposable, (B-D SYRINGE LUER-LOK 1CC) 1 ML MISC use as directed 20 each 0   testosterone cypionate (DEPOTESTOSTERONE CYPIONATE) 200 MG/ML injection Inject 0.5 mLs (100 mg total) into the skin once a week. 2 mL 0   tirzepatide (MOUNJARO) 15 MG/0.5ML Pen Inject 15 mg into the skin once a week. 6 mL 6   TUBERCULIN SYR 1CC/27GX1/2" (B-D TB SYRINGE 1CC/27GX1/2") 27G X 1/2" 1 ML MISC Use as directed. 20 each 1   dapagliflozin propanediol (FARXIGA) 10 MG TABS tablet Take 1 tablet (10 mg total) by mouth daily before breakfast. 90 tablet 1   No facility-administered medications prior to visit.     ROS Review of Systems  Constitutional:  Negative for activity change and appetite change.  HENT:  Negative for sinus pressure and sore throat.   Respiratory:  Negative for chest tightness, shortness of breath and wheezing.   Cardiovascular:  Negative for chest pain and palpitations.  Gastrointestinal:  Negative for abdominal distention, abdominal pain and constipation.  Genitourinary: Negative.   Musculoskeletal: Negative.   Psychiatric/Behavioral:  Negative for behavioral problems and dysphoric mood.     Objective:  BP 123/85   Pulse 77   Ht 5\' 5"  (1.651 m)   Wt 281 lb (127.5 kg)   LMP 07/24/2023   SpO2 98%   BMI 46.76 kg/m      09/10/2023    9:34 AM  08/15/2023    8:19 AM 03/12/2023    9:06 AM  BP/Weight  Systolic BP 123 125 126  Diastolic BP 85 83 85  Wt. (Lbs) 281  289.2  BMI 46.76 kg/m2  48.13 kg/m2    Wt Readings from Last 3 Encounters:  09/10/23 281 lb (127.5 kg)  03/12/23 289 lb 3.2 oz (131.2 kg)  09/09/22 299 lb 9.6 oz (135.9 kg)     Physical Exam Constitutional:      Appearance: He is well-developed. He is obese.  Cardiovascular:     Rate and Rhythm: Normal rate.     Heart sounds: Normal heart  sounds. No murmur heard. Pulmonary:     Effort: Pulmonary effort is normal.     Breath sounds: Normal breath sounds. No wheezing or rales.  Chest:     Chest wall: No tenderness.  Abdominal:     General: Bowel sounds are normal. There is no distension.     Palpations: Abdomen is soft. There is no mass.     Tenderness: There is no abdominal tenderness.  Musculoskeletal:        General: Normal range of motion.     Right lower leg: No edema.     Left lower leg: No edema.  Neurological:     Mental Status: He is alert and oriented to person, place, and time.  Psychiatric:        Mood and Affect: Mood normal.        Latest Ref Rng & Units 03/12/2023   10:08 AM 09/09/2022   10:31 AM 05/20/2022   12:51 AM  CMP  Glucose 70 - 99 mg/dL 67  638  756   BUN 6 - 20 mg/dL 7  9  7    Creatinine 0.57 - 1.00 mg/dL 4.33  2.95  1.88   Sodium 134 - 144 mmol/L 140  141  139   Potassium 3.5 - 5.2 mmol/L 4.0  4.6  3.7   Chloride 96 - 106 mmol/L 103  103  106   CO2 20 - 29 mmol/L 24  24  25    Calcium 8.7 - 10.2 mg/dL 8.9  9.0  8.9   Total Protein 6.0 - 8.5 g/dL 7.0  7.3  7.3   Total Bilirubin 0.0 - 1.2 mg/dL 0.6  0.5  0.5   Alkaline Phos 44 - 121 IU/L 89  97  67   AST 0 - 40 IU/L 29  29  45   ALT 0 - 32 IU/L 31  27  42     Lipid Panel     Component Value Date/Time   CHOL 159 09/09/2022 1031   TRIG 110 09/09/2022 1031   HDL 37 (L) 09/09/2022 1031   CHOLHDL 4.2 08/01/2020 1033   LDLCALC 102 (H) 09/09/2022 1031    CBC     Component Value Date/Time   WBC 7.7 03/12/2023 1008   WBC 8.7 05/20/2022 0051   RBC 6.38 (H) 03/12/2023 1008   RBC 6.60 (H) 05/20/2022 0051   HGB 17.0 (H) 03/12/2023 1008   HCT 52.9 (H) 03/12/2023 1008   PLT 286 03/12/2023 1008   MCV 83 03/12/2023 1008   MCH 26.6 03/12/2023 1008   MCH 24.5 (L) 05/20/2022 0051   MCHC 32.1 03/12/2023 1008   MCHC 31.0 05/20/2022 0051   RDW 14.9 03/12/2023 1008   LYMPHSABS 2.9 03/12/2023 1008   MONOABS 0.5 05/20/2022 0051   EOSABS 0.2 03/12/2023 1008   BASOSABS 0.0 03/12/2023 1008    Lab Results  Component Value Date   HGBA1C 5.7 09/10/2023       Assessment & Plan Type 2 Diabetes Mellitus A1c at 5.7 indicates excellent control. Discussed transitioning from insulin to oral medications.  Did not do well on metformin due to GI side effects in the past.  Advised that if we have to switch from insulin to oral medications we may need 2 or 3 medications to achieve euglycemia as compared to current insulin dose.  Explained insulin's role in preserving pancreatic function. - Continue insulin glargine (Lantus) 45 units daily after shared decision making.  Also on Farxiga. - Monitor A1c and  blood glucose levels regularly. -Counseled on Diabetic diet, my plate method, 409 minutes of moderate intensity exercise/week Blood sugar logs with fasting goals of 80-120 mg/dl, random of less than 811 and in the event of sugars less than 60 mg/dl or greater than 914 mg/dl encouraged to notify the clinic. Advised on the need for annual eye exams, annual foot exams, Pneumonia vaccine.   Morbid Obesity Weight gain post-smoking cessation. On maximum dose of Tirzepatide. Discussed dietary habits and fast food impact. Suggested lifestyle modifications. -Weight loss of 18 pounds in the last 1 year - Continue Tirzepatide (Mounjaro) 15 mg weekly. - Encourage meal prep and healthier food choices. - Consider referral to bariatric surgery for weight loss  intervention.  Smoking Cessation Increased appetite and weight gain post-cessation. Discussed Wellbutrin for smoking cessation and managing cravings and appetite. -Advised to be tactical when grocery shopping ensuring that healthy foods are purchased  Morbid obesity Interested in bariatric surgery. Agreed to refer for evaluation. - Refer to bariatric surgeon for evaluation.  General Health Maintenance Routine follow-up and monitoring. Labs to be drawn today. - Schedule follow-up visit in 6 months. - Order labs and review results with him. - Refer for eye exam. - Administer pneumonia vaccine.      Meds ordered this encounter  Medications   dapagliflozin propanediol (FARXIGA) 10 MG TABS tablet    Sig: Take 1 tablet (10 mg total) by mouth daily before breakfast.    Dispense:  90 tablet    Refill:  1    Follow-up: Return in about 6 months (around 03/11/2024) for Chronic medical conditions.       Hoy Register, MD, FAAFP. [facility] [place], [place] [phone]   09/10/2023, 11:03 AM

## 2023-09-11 ENCOUNTER — Encounter: Payer: Self-pay | Admitting: Family Medicine

## 2023-09-11 LAB — LP+NON-HDL CHOLESTEROL
Cholesterol, Total: 158 mg/dL (ref 100–199)
HDL: 48 mg/dL
LDL Chol Calc (NIH): 91 mg/dL (ref 0–99)
Total Non-HDL-Chol (LDL+VLDL): 110 mg/dL (ref 0–129)
Triglycerides: 102 mg/dL (ref 0–149)
VLDL Cholesterol Cal: 19 mg/dL (ref 5–40)

## 2023-09-11 LAB — CMP14+EGFR
ALT: 39 IU/L — ABNORMAL HIGH (ref 0–32)
AST: 27 IU/L (ref 0–40)
Albumin: 4 g/dL (ref 3.9–4.9)
Alkaline Phosphatase: 102 IU/L (ref 44–121)
BUN/Creatinine Ratio: 14 (ref 9–23)
BUN: 9 mg/dL (ref 6–20)
Bilirubin Total: 0.3 mg/dL (ref 0.0–1.2)
CO2: 25 mmol/L (ref 20–29)
Calcium: 9 mg/dL (ref 8.7–10.2)
Chloride: 103 mmol/L (ref 96–106)
Creatinine, Ser: 0.64 mg/dL (ref 0.57–1.00)
Globulin, Total: 3.1 g/dL (ref 1.5–4.5)
Glucose: 81 mg/dL (ref 70–99)
Potassium: 4.2 mmol/L (ref 3.5–5.2)
Sodium: 140 mmol/L (ref 134–144)
Total Protein: 7.1 g/dL (ref 6.0–8.5)
eGFR: 121 mL/min/1.73

## 2023-09-11 LAB — MICROALBUMIN / CREATININE URINE RATIO
Creatinine, Urine: 168.7 mg/dL
Microalb/Creat Ratio: 9 mg/g{creat} (ref 0–29)
Microalbumin, Urine: 14.6 ug/mL

## 2023-09-24 ENCOUNTER — Other Ambulatory Visit: Payer: Self-pay

## 2023-10-23 ENCOUNTER — Other Ambulatory Visit: Payer: Self-pay

## 2023-11-20 ENCOUNTER — Other Ambulatory Visit: Payer: Self-pay

## 2023-11-25 ENCOUNTER — Other Ambulatory Visit: Payer: Self-pay

## 2023-12-18 ENCOUNTER — Other Ambulatory Visit: Payer: Self-pay

## 2024-01-05 NOTE — Procedures (Signed)
 Progress Notes  Neysa Reggy BIRCH, MD at 01/08/2018  8:00 PM  Status: Sign when Signing Visit       Patient Name: Diana Joyce, Diana Joyce Date: 01/08/2018 Gender: Female D.O.B: [birth year] Age (years): 26 Referring Provider: Corrina Cost Height (inches): 65 Interpreting Physician: Reggy Neysa MD, ABSM Weight (lbs): 300 RPSGT: Leane Easter BMI: 50 MRN: [id number] Neck Size: 17.50   CLINICAL INFORMATION The patient is referred for a CPAP titration to treat sleep apnea.   Date of NPSG, Split Night or HST:    NPSG 08/13/17   AHI 37.9/ hr, desaturation to 71%, body weight 305 lbs.   SLEEP STUDY TECHNIQUE As per the AASM Manual for the Scoring of Sleep and Associated Events v2.3 (April 2016) with a hypopnea requiring 4% desaturations.   The channels recorded and monitored were frontal, central and occipital EEG, electrooculogram (EOG), submentalis EMG (chin), nasal and oral airflow, thoracic and abdominal wall motion, anterior tibialis EMG, snore microphone, electrocardiogram, and pulse oximetry. Continuous positive airway pressure (CPAP) was initiated at the beginning of the study and titrated to treat sleep-disordered breathing.   MEDICATIONS Medications self-administered by patient taken the night of the study : TRAZODONE , GABAPENTIN , HYDROXYZINE , SIITAGLIPIN, INSULIN    TECHNICIAN COMMENTS Comments added by technician: PATIENT TOLERATED THE MASK GOOD Comments added by scorer: N/A RESPIRATORY PARAMETERS Optimal PAP Pressure (cm):13AHI at Optimal Pressure (/hr):0.0 Overall Minimal O2 (%):87.0Supine % at Optimal Pressure (%):100 Minimal O2 at Optimal Pressure (%): 91.0        SLEEP ARCHITECTURE The study was initiated at 10:45:58 PM and ended at 4:46:30 AM.   Sleep onset time was 0.8 minutes and the sleep efficiency was 99.1%%. The total sleep time was 357.3 minutes.   The patient spent 0.8%% of the night in stage N1 sleep, 66.6%% in stage N2 sleep, 22.0%% in stage N3 and 10.6% in  REM.Stage REM latency was 177.5 minutes   Wake after sleep onset was 2.5. Alpha intrusion was absent. Supine sleep was 100.00%.   CARDIAC DATA The 2 lead EKG demonstrated sinus rhythm. The mean heart rate was 86.9 beats per minute. Other EKG findings include: None.   The total Periodic Limb Movements of Sleep (PLMS) were 0. The PLMS index was 0.0. A PLMS index of <15 is considered normal in adults.   IMPRESSIONS - The optimal PAP pressure was 13 cm of water. - Central sleep apnea was not noted during this titration (CAI = 0.2/h). - Mild oxygen desaturations were observed during this titration (min O2 = 87.0%). - No snoring was audible during this study. - No cardiac abnormalities were observed during this study. - Clinically significant periodic limb movements were not noted during this study. Arousals associated with PLMs were rare.   DIAGNOSIS - Obstructive Sleep Apnea (327.23 [G47.33 ICD-10])   RECOMMENDATIONS - Trial of CPAP therapy on 13 cwp or DME autopap 10-20. Patient wore a Small size [employer] Respironics Nasal Pillow Mask DreamWear Gel mask and heated humidification. - Be careful with alcohol, sedatives and other CNS depressants that may worsen sleep apnea and disrupt normal sleep architecture. - Sleep hygiene should be reviewed to assess factors that may improve sleep quality. - Weight management and regular exercise should be initiated or continued.   [Electronically signed] 01/28/2018 11:19 AM   Reggy Neysa MD, ABSM Diplomate, American Board of Sleep Medicine     NPI: [id number]

## 2024-01-08 ENCOUNTER — Telehealth: Payer: Self-pay | Admitting: Family Medicine

## 2024-01-08 DIAGNOSIS — G4733 Obstructive sleep apnea (adult) (pediatric): Secondary | ICD-10-CM

## 2024-01-08 MED ORDER — MISC. DEVICES MISC
0 refills | Status: AC
Start: 2024-01-08 — End: ?

## 2024-01-08 NOTE — Telephone Encounter (Signed)
 Please inform her that sleep study reveals she has sleep apnea and I have printed a prescription for CPAP supplies to be sent to her DME company.  Thank you

## 2024-01-08 NOTE — Telephone Encounter (Signed)
LVM for return phone call.

## 2024-01-28 ENCOUNTER — Other Ambulatory Visit: Payer: Self-pay | Admitting: Pharmacist

## 2024-01-28 ENCOUNTER — Other Ambulatory Visit: Payer: Self-pay

## 2024-01-28 MED ORDER — INSULIN SYRINGE-NEEDLE U-100 30G X 5/16" 0.5 ML MISC
2 refills | Status: AC
  Filled 2024-01-28 – 2024-03-07 (×2): qty 100, 90d supply, fill #0

## 2024-01-28 MED ORDER — BD DISP NEEDLES 27G X 1/2" MISC
6 refills | Status: AC
Start: 2024-01-28 — End: ?
  Filled 2024-01-28: qty 100, 30d supply, fill #0

## 2024-01-29 ENCOUNTER — Other Ambulatory Visit: Payer: Self-pay

## 2024-02-01 ENCOUNTER — Other Ambulatory Visit: Payer: Self-pay

## 2024-02-11 ENCOUNTER — Other Ambulatory Visit: Payer: Self-pay

## 2024-03-07 ENCOUNTER — Other Ambulatory Visit: Payer: Self-pay

## 2024-03-09 ENCOUNTER — Other Ambulatory Visit: Payer: Self-pay

## 2024-03-10 ENCOUNTER — Other Ambulatory Visit: Payer: Self-pay

## 2024-03-10 ENCOUNTER — Encounter: Payer: Self-pay | Admitting: Family Medicine

## 2024-03-10 ENCOUNTER — Telehealth: Payer: Self-pay | Admitting: Family Medicine

## 2024-03-10 ENCOUNTER — Other Ambulatory Visit: Payer: Self-pay | Admitting: Family Medicine

## 2024-03-10 MED ORDER — FLUCONAZOLE 150 MG PO TABS
150.0000 mg | ORAL_TABLET | Freq: Once | ORAL | 0 refills | Status: AC
  Filled 2024-03-10: qty 2, 3d supply, fill #0

## 2024-03-10 NOTE — Telephone Encounter (Signed)
 Called pt to confirm appt. Pt did not answer and LVM

## 2024-03-11 ENCOUNTER — Other Ambulatory Visit: Payer: Self-pay

## 2024-03-15 ENCOUNTER — Ambulatory Visit: Admitting: Family Medicine

## 2024-03-16 ENCOUNTER — Other Ambulatory Visit: Payer: Self-pay

## 2024-04-18 ENCOUNTER — Other Ambulatory Visit: Payer: Self-pay | Admitting: Family Medicine

## 2024-04-19 ENCOUNTER — Other Ambulatory Visit: Payer: Self-pay

## 2024-04-19 ENCOUNTER — Ambulatory Visit: Admitting: Family Medicine

## 2024-04-19 MED ORDER — MOUNJARO 15 MG/0.5ML ~~LOC~~ SOAJ
15.0000 mg | SUBCUTANEOUS | 0 refills | Status: DC
  Filled 2024-04-19: qty 6, 84d supply, fill #0

## 2024-04-21 ENCOUNTER — Other Ambulatory Visit: Payer: Self-pay

## 2024-04-28 ENCOUNTER — Other Ambulatory Visit: Payer: Self-pay

## 2024-06-14 ENCOUNTER — Encounter: Payer: Self-pay | Admitting: Family Medicine

## 2024-06-14 ENCOUNTER — Other Ambulatory Visit (HOSPITAL_COMMUNITY)
Admission: RE | Admit: 2024-06-14 | Discharge: 2024-06-14 | Disposition: A | Discharge status: Home / Self Care | Source: Ambulatory Visit | Attending: Family Medicine | Admitting: Family Medicine

## 2024-06-14 ENCOUNTER — Ambulatory Visit: Attending: Family Medicine | Admitting: Family Medicine

## 2024-06-14 ENCOUNTER — Other Ambulatory Visit: Payer: Self-pay

## 2024-06-14 VITALS — BP 128/83 | HR 83 | Temp 98.1°F | Ht 65.0 in | Wt 293.4 lb

## 2024-06-14 DIAGNOSIS — E669 Obesity, unspecified: Secondary | ICD-10-CM

## 2024-06-14 DIAGNOSIS — N898 Other specified noninflammatory disorders of vagina: Secondary | ICD-10-CM | POA: Insufficient documentation

## 2024-06-14 DIAGNOSIS — N760 Acute vaginitis: Secondary | ICD-10-CM | POA: Diagnosis not present

## 2024-06-14 DIAGNOSIS — Z794 Long term (current) use of insulin: Secondary | ICD-10-CM

## 2024-06-14 DIAGNOSIS — Z6841 Body Mass Index (BMI) 40.0 and over, adult: Secondary | ICD-10-CM

## 2024-06-14 DIAGNOSIS — E1165 Type 2 diabetes mellitus with hyperglycemia: Secondary | ICD-10-CM

## 2024-06-14 DIAGNOSIS — Z23 Encounter for immunization: Secondary | ICD-10-CM

## 2024-06-14 DIAGNOSIS — Z7985 Long-term (current) use of injectable non-insulin antidiabetic drugs: Secondary | ICD-10-CM | POA: Diagnosis not present

## 2024-06-14 DIAGNOSIS — E1169 Type 2 diabetes mellitus with other specified complication: Secondary | ICD-10-CM

## 2024-06-14 LAB — POCT GLYCOSYLATED HEMOGLOBIN (HGB A1C): HbA1c, POC (controlled diabetic range): 9.8 % — AB (ref 0.0–7.0)

## 2024-06-14 MED ORDER — INSULIN PEN NEEDLE 31G X 5 MM MISC
1.0000 | Freq: Every day | 1 refills | Status: AC
  Filled 2024-06-14: qty 100, 100d supply, fill #0

## 2024-06-14 MED ORDER — LANTUS SOLOSTAR 100 UNIT/ML ~~LOC~~ SOPN
45.0000 [IU] | PEN_INJECTOR | Freq: Every day | SUBCUTANEOUS | 3 refills | Status: AC
  Filled 2024-06-14: qty 12, 26d supply, fill #0

## 2024-06-14 MED ORDER — DAPAGLIFLOZIN PROPANEDIOL 10 MG PO TABS
10.0000 mg | ORAL_TABLET | Freq: Every day | ORAL | 1 refills | Status: AC
  Filled 2024-06-14: qty 30, 30d supply, fill #0

## 2024-06-14 NOTE — Progress Notes (Signed)
 "  Subjective:  Patient ID: Diana Joyce, adult    DOB: 1991-01-24  Age: 34 y.o. MRN: [id number]  CC: Medical Management of Chronic Issues (Discuss weight loss)     Discussed the use of AI scribe software for clinical note transcription with the patient, who gave verbal consent to proceed.  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  He reports difficulty managing diabetes and weight with inconsistent use of Mounjaro  and Lantus . He stopped Mounjaro  15 mg weekly about three weeks ago and has been taking Lantus  45 units nightly inconsistently. He checks blood sugars only sporadically when he feels unwell.  He initially lost weight on Mounjaro  from 330-340 lbs to 281 lbs but has regained to 293.4 lbs after stopping it. He is frustrated with the plateau and recent weight gain.  He describes a busy, stressful schedule with work and caring for his wife on home dialysis, and he is considering an additional job for financial reasons. These demands make consistent diabetes care more difficult.  He has recurrent yeast infections that he associates with high blood sugars. He was told he had trichomonas at urgent care in the past but is currently asymptomatic. He is concerned about recurring genitourinary symptoms around his menstrual cycle and wants management options.  He requests specific dietary guidance for weight loss, including recommended daily caloric intake.    Past Medical History:  Diagnosis Date   Apnea, sleep    Asthma    Diabetes (HCC)     Past Surgical History:  Procedure Laterality Date   FRACTURE SURGERY      Family History  Problem Relation Age of Onset   Kidney failure Mother    Hypertension Mother    Diabetes  Mother    Heart failure Mother    Diabetes Father     Social History   Socioeconomic History   Marital status: Single    Spouse name: Not on file   Number of children: Not on file   Years of education: Not on file   Highest education level: Not on file  Occupational History   Not on file  Tobacco Use   Smoking status: Former    Types: Cigars   Smokeless tobacco: Never  Vaping Use   Vaping status: Never Used  Substance and Sexual Activity   Alcohol use: Yes   Drug use: No   Sexual activity: Yes    Birth control/protection: None  Other Topics Concern   Not on file  Social History Narrative   Not on file   Social Drivers of Health   Tobacco Use: Medium Risk (09/10/2023)   Patient History    Smoking Tobacco Use: Former    Smokeless Tobacco Use: Never    Passive Exposure: Not on Actuary Strain: Not on file  Food Insecurity: Not on file  Transportation Needs: Not on file  Physical Activity: Not on file  Stress: Not on file  Social Connections: Not on file  Depression (PHQ2-9): Medium Risk (09/10/2023)   Depression (PHQ2-9)    PHQ-2 Score: 5  Alcohol Screen: Not on file  Housing: Not on file  Utilities: Not on file  Health Literacy: Not on file    Allergies[1]  Outpatient Medications Prior to Visit  Medication  Sig Dispense Refill   albuterol  (VENTOLIN  HFA) 108 (90 Base) MCG/ACT inhaler Inhale 2 puffs into the lungs every 6 (six) hours as needed for wheezing or shortness of breath. 8.5 g 2   Blood Glucose Monitoring Suppl (CONTOUR NEXT EZ) w/Device KIT Use as directed. 1 kit 0   glucose blood (CONTOUR TEST) test strip Use as directed 3 (three) times daily before meals. Dx E11.69 100 each 0   Insulin  Syringe-Needle U-100 (TRUEPLUS INSULIN  SYRINGE) 30G X 5/16 0.5 ML MISC Use to inject insulin  once daily. 100 each 2   metroNIDAZOLE  (FLAGYL ) 500 MG tablet Take 1 tablet (500 mg total) by mouth 2 (two) times daily. 14 tablet 0   Microlet Lancets MISC Use  as directed 3 (three) times daily before meals. Dx E11.69 100 each 0   Misc. Devices MISC Autopap 10-20.  Small size Philips Respironics Nasal Pillow Mask DreamWear Gel mask and heated humidification.  Diagnosis-obstructive sleep apnea 1 each 0   Needle, Disp, (HYPODERMIC NEEDLE 18GX1) 18G X 1 MISC Use for weekly hormone injections as directed 20 each 1   Needle, Disp, (HYPODERMIC NEEDLE 25GX5/8) 25G X 5/8 MISC Use for weekly hormone injections as directed. 20 each 1   NEEDLE, DISP, 25 G (EASY TOUCH FLIPLOCK NEEDLES) 25G X 5/8 MISC use as directed 20 each 0   NEEDLE, DISP, 27 G (BD DISP NEEDLES) 27G X 1/2 MISC Use as directed 100 each 6   Needles & Syringes (EASY TOUCH SYRINGE BARREL ) MISC Use for weekly hormone injections as directed 20 each 1   Syringe, Disposable, (B-D SYRINGE LUER-LOK 1CC) 1 ML MISC use as directed 20 each 0   testosterone  cypionate (DEPOTESTOSTERONE CYPIONATE) 200 MG/ML injection Inject 0.5 mLs (100 mg total) into the skin once a week. 2 mL 0   tirzepatide  (MOUNJARO ) 15 MG/0.5ML Pen Inject 15 mg into the skin once a week. 6 mL 0   TUBERCULIN SYR 1CC/27GX1/2 (B-D TB SYRINGE 1CC/27GX1/2) 27G X 1/2 1 ML MISC Use as directed. 20 each 1   dapagliflozin  propanediol (FARXIGA ) 10 MG TABS tablet Take 1 tablet (10 mg total) by mouth daily before breakfast. 90 tablet 1   insulin  glargine (LANTUS ) 100 UNIT/ML injection Inject 0.45 mLs (45 Units total) into the skin daily. 190 mL 3   aluminum chloride (DRYSOL) 20 % external solution Apply topically at bedtime. 35 mL 1   blood glucose meter kit and supplies KIT Dispense with test strips and lancets based on insurance preference. Use up to four times daily as directed. (FOR ICD-9 250.00, 250.01). 100 each 12   diclofenac  Sodium (VOLTAREN ) 1 % GEL Apply 4 g topically 4 (four) times daily. (Patient not taking: Reported on 06/14/2024) 100 g 1   nystatin  (MYCOSTATIN /NYSTOP ) powder Apply 1 application topically 3 (three) times daily. 60  g 1   No facility-administered medications prior to visit.     ROS Review of Systems  Constitutional:  Negative for activity change and appetite change.  HENT:  Negative for sinus pressure and sore throat.   Respiratory:  Negative for chest tightness, shortness of breath and wheezing.   Cardiovascular:  Negative for chest pain and palpitations.  Gastrointestinal:  Negative for abdominal distention, abdominal pain and constipation.  Genitourinary: Negative.   Musculoskeletal: Negative.   Psychiatric/Behavioral:  Negative for behavioral problems and dysphoric mood.     Objective:  BP 128/83   Pulse 83   Temp 98.1 F (36.7 C) (Oral)   Ht 5' 5 (1.651 m)  Wt 293 lb 6.4 oz (133.1 kg)   SpO2 98%   BMI 48.82 kg/m      06/14/2024    4:14 PM 09/10/2023    9:34 AM 08/15/2023    8:19 AM  BP/Weight  Systolic BP 128 123 125  Diastolic BP 83 85 83  Wt. (Lbs) 293.4 281   BMI 48.82 kg/m2 46.76 kg/m2    Wt Readings from Last 3 Encounters:  06/14/24 293 lb 6.4 oz (133.1 kg)  09/10/23 281 lb (127.5 kg)  03/12/23 289 lb 3.2 oz (131.2 kg)      Physical Exam Constitutional:      Appearance: He is well-developed. He is obese.  Cardiovascular:     Rate and Rhythm: Normal rate.     Heart sounds: Normal heart sounds. No murmur heard. Pulmonary:     Effort: Pulmonary effort is normal.     Breath sounds: Normal breath sounds. No wheezing or rales.  Chest:     Chest wall: No tenderness.  Abdominal:     General: Bowel sounds are normal. There is no distension.     Palpations: Abdomen is soft. There is no mass.     Tenderness: There is no abdominal tenderness.  Musculoskeletal:        General: Normal range of motion.     Right lower leg: No edema.     Left lower leg: No edema.  Neurological:     Mental Status: He is alert and oriented to person, place, and time.  Psychiatric:        Mood and Affect: Mood normal.        Latest Ref Rng & Units 09/10/2023   10:07 AM 03/12/2023    10:08 AM 09/09/2022   10:31 AM  CMP  Glucose 70 - 99 mg/dL 81  67  893   BUN 6 - 20 mg/dL 9  7  9    Creatinine 0.57 - 1.00 mg/dL 9.35  9.12  9.14   Sodium 134 - 144 mmol/L 140  140  141   Potassium 3.5 - 5.2 mmol/L 4.2  4.0  4.6   Chloride 96 - 106 mmol/L 103  103  103   CO2 20 - 29 mmol/L 25  24  24    Calcium 8.7 - 10.2 mg/dL 9.0  8.9  9.0   Total Protein 6.0 - 8.5 g/dL 7.1  7.0  7.3   Total Bilirubin 0.0 - 1.2 mg/dL 0.3  0.6  0.5   Alkaline Phos 44 - 121 IU/L 102  89  97   AST 0 - 40 IU/L 27  29  29    ALT 0 - 32 IU/L 39  31  27     Lipid Panel     Component Value Date/Time   CHOL 158 09/10/2023 1007   TRIG 102 09/10/2023 1007   HDL 48 09/10/2023 1007   CHOLHDL 4.2 08/01/2020 1033   LDLCALC 91 09/10/2023 1007    CBC    Component Value Date/Time   WBC 7.7 03/12/2023 1008   WBC 8.7 05/20/2022 0051   RBC 6.38 (H) 03/12/2023 1008   RBC 6.60 (H) 05/20/2022 0051   HGB 17.0 (H) 03/12/2023 1008   HCT 52.9 (H) 03/12/2023 1008   PLT 286 03/12/2023 1008   MCV 83 03/12/2023 1008   MCH 26.6 03/12/2023 1008   MCH 24.5 (L) 05/20/2022 0051   MCHC 32.1 03/12/2023 1008   MCHC 31.0 05/20/2022 0051   RDW 14.9 03/12/2023 1008   LYMPHSABS  2.9 03/12/2023 1008   MONOABS 0.5 05/20/2022 0051   EOSABS 0.2 03/12/2023 1008   BASOSABS 0.0 03/12/2023 1008    Lab Results  Component Value Date   HGBA1C 9.8 (A) 06/14/2024    Lab Results  Component Value Date   HGBA1C 9.8 (A) 06/14/2024   HGBA1C 5.7 09/10/2023   HGBA1C 6.2 03/12/2023       Assessment & Plan Type 2 diabetes mellitus Suboptimal glycemic control with A1c of 9.8. Goal is less than 7.0 Inconsistent Lantus  and Mounjaro  use due to hypoglycemic symptoms.  Emphasized medication adherence and we have discussed strategies to aid adherence - Refilled Lantus  45 units daily. - Refilled Mounjaro  15 mg weekly. - Ordered comprehensive metabolic panel and microalbumin test.  Morbid obesity Weight increased to 293 lbs from 281  pounds after Mounjaro  discontinuation. Discussed Mounjaro 's efficacy in weight loss and potential for surgery if needed. Explained Mounjaro 's dual pathway mechanism and potential for significant weight loss. - Resume Mounjaro  15 mg weekly. - Discussed potential referral for weight loss surgery if needed. - Advised on dietary intake of 1200 calories per day.  Recurrent vaginitis (yeast infection and trichomoniasis) Intermittent symptoms possibly related to fluctuating blood sugar. No current symptoms but history of recurrence. Discussed yeast infections due to high blood sugar. - Ordered vaginal swab for trichomoniasis and yeast infection. - Prescribed Diflucan  for potential yeast infections.     Healthcare maintenance Encounter for vaccine administration-flu shot administered  Meds ordered this encounter  Medications   dapagliflozin  propanediol (FARXIGA ) 10 MG TABS tablet    Sig: Take 1 tablet (10 mg total) by mouth daily before breakfast.    Dispense:  90 tablet    Refill:  1   insulin  glargine (LANTUS  SOLOSTAR) 100 UNIT/ML Solostar Pen    Sig: Inject 45 Units into the skin daily.    Dispense:  30 mL    Refill:  3   Insulin  Pen Needle 31G X 5 MM MISC    Sig: Use at bedtime.    Dispense:  100 each    Refill:  1    Follow-up: Return in about 3 months (around 09/12/2024).       Corrina Sabin, MD, FAAFP. [facility] [place], [place] [phone]   06/14/2024, 5:05 PM    [1]  Allergies Allergen Reactions   Benadryl [Diphenhydramine Hcl] Other (See Comments)    Hives, coughing, itching.    "

## 2024-06-14 NOTE — Patient Instructions (Signed)
 VISIT SUMMARY:  Today, we discussed your diabetes management, weight loss, and recurrent yeast infections. We reviewed your current medications and made some adjustments to help you better manage your blood sugar levels and weight. We also addressed your concerns about recurrent yeast infections and provided a plan for managing these symptoms.  YOUR PLAN:  -TYPE 2 DIABETES MELLITUS: Type 2 diabetes is a condition where your body does not use insulin  properly, leading to high blood sugar levels. We discussed the importance of taking your medications consistently to manage your blood sugar. We refilled your prescriptions for Lantus  (45 units daily) and Mounjaro  (15 mg weekly). We also ordered a comprehensive metabolic panel and a microalbumin test to monitor your condition.  -MORBID OBESITY: Morbid obesity is a condition where excess body fat negatively affects your health. Your weight has increased to 293.4 lbs after stopping Mounjaro . We discussed the effectiveness of Mounjaro  in aiding weight loss and the possibility of weight loss surgery if needed. You should resume taking Mounjaro  (15 mg weekly) and aim for a daily caloric intake of 1200 calories.  -RECURRENT VAGINITIS (YEAST INFECTION AND TRICHOMONIASIS): Recurrent vaginitis is the frequent occurrence of vaginal infections, which can be related to high blood sugar levels. Although you have no current symptoms, we ordered a vaginal swab to check for trichomoniasis and yeast infection. We also prescribed Diflucan  to treat potential yeast infections.  INSTRUCTIONS:  Please follow up with the lab tests we ordered: a comprehensive metabolic panel and a microalbumin test. Continue taking Lantus  and Mounjaro  as prescribed. If you experience any symptoms of a yeast infection, take the prescribed Diflucan . Maintain a daily caloric intake of 1200 calories and consider the potential for weight loss surgery if needed. Schedule a follow-up appointment to  review your lab results and progress.

## 2024-06-15 ENCOUNTER — Ambulatory Visit: Payer: Self-pay | Admitting: Family Medicine

## 2024-06-15 LAB — CMP14+EGFR
ALT: 34 IU/L — ABNORMAL HIGH (ref 0–32)
AST: 23 IU/L (ref 0–40)
Albumin: 4.3 g/dL (ref 3.9–4.9)
Alkaline Phosphatase: 120 IU/L — ABNORMAL HIGH (ref 41–116)
BUN/Creatinine Ratio: 16 (ref 9–23)
BUN: 12 mg/dL (ref 6–20)
Bilirubin Total: 0.3 mg/dL (ref 0.0–1.2)
CO2: 24 mmol/L (ref 20–29)
Calcium: 9.5 mg/dL (ref 8.7–10.2)
Chloride: 97 mmol/L (ref 96–106)
Creatinine, Ser: 0.77 mg/dL (ref 0.57–1.00)
Globulin, Total: 3.5 g/dL (ref 1.5–4.5)
Glucose: 200 mg/dL — ABNORMAL HIGH (ref 70–99)
Potassium: 3.9 mmol/L (ref 3.5–5.2)
Sodium: 137 mmol/L (ref 134–144)
Total Protein: 7.8 g/dL (ref 6.0–8.5)
eGFR: 105 mL/min/1.73

## 2024-06-15 LAB — MICROALBUMIN / CREATININE URINE RATIO
Creatinine, Urine: 47.3 mg/dL
Microalb/Creat Ratio: <6 mg/g{creat} (ref 0–29)
Microalbumin, Urine: <3 ug/mL

## 2024-06-16 LAB — CERVICOVAGINAL ANCILLARY ONLY
Bacterial Vaginitis (gardnerella): POSITIVE — AB
Candida Glabrata: NEGATIVE
Candida Vaginitis: NEGATIVE
Chlamydia: NEGATIVE
Comment: NEGATIVE
Comment: NEGATIVE
Comment: NEGATIVE
Comment: NEGATIVE
Comment: NEGATIVE
Comment: NORMAL
Neisseria Gonorrhea: NEGATIVE
Trichomonas: POSITIVE — AB

## 2024-06-17 ENCOUNTER — Other Ambulatory Visit: Payer: Self-pay

## 2024-06-17 MED ORDER — METRONIDAZOLE 500 MG PO TABS
500.0000 mg | ORAL_TABLET | Freq: Two times a day (BID) | ORAL | 0 refills | Status: AC
  Filled 2024-06-17: qty 14, 7d supply, fill #0

## 2024-06-21 ENCOUNTER — Other Ambulatory Visit: Payer: Self-pay

## 2024-06-21 ENCOUNTER — Other Ambulatory Visit: Payer: Self-pay | Admitting: Family Medicine

## 2024-06-21 MED ORDER — MOUNJARO 15 MG/0.5ML ~~LOC~~ SOAJ
15.0000 mg | SUBCUTANEOUS | 0 refills | Status: AC
  Filled 2024-06-21 – 2024-07-13 (×2): qty 6, 84d supply, fill #0

## 2024-07-13 ENCOUNTER — Other Ambulatory Visit: Payer: Self-pay | Admitting: Family Medicine

## 2024-07-13 DIAGNOSIS — E1169 Type 2 diabetes mellitus with other specified complication: Secondary | ICD-10-CM

## 2024-07-14 ENCOUNTER — Other Ambulatory Visit: Payer: Self-pay

## 2024-07-14 MED ORDER — CONTOUR NEXT TEST VI STRP
ORAL_STRIP | 0 refills | Status: AC
  Filled 2024-07-14: qty 100, 30d supply, fill #0

## 2024-09-13 ENCOUNTER — Ambulatory Visit: Payer: Self-pay | Admitting: Family Medicine
